# Patient Record
Sex: Female | Born: 1955 | Hispanic: No | Smoking: Never smoker
Health system: Southern US, Community
[De-identification: ages and names within clinical notes are randomized; demographics above are authoritative.]

## PROBLEM LIST (undated history)

## (undated) DIAGNOSIS — F209 Schizophrenia, unspecified: Secondary | ICD-10-CM

## (undated) DIAGNOSIS — F319 Bipolar disorder, unspecified: Secondary | ICD-10-CM

## (undated) DIAGNOSIS — I10 Essential (primary) hypertension: Secondary | ICD-10-CM

## (undated) DIAGNOSIS — R7303 Prediabetes: Secondary | ICD-10-CM

## (undated) DIAGNOSIS — R000 Tachycardia, unspecified: Secondary | ICD-10-CM

## (undated) HISTORY — PX: COLON SURGERY: SHX602

---

## 1997-12-20 ENCOUNTER — Other Ambulatory Visit: Admission: RE | Admit: 1997-12-20 | Discharge: 1997-12-20 | Payer: Self-pay | Admitting: *Deleted

## 2009-01-29 ENCOUNTER — Emergency Department (HOSPITAL_COMMUNITY): Admission: EM | Admit: 2009-01-29 | Discharge: 2009-01-29 | Payer: Self-pay | Admitting: Emergency Medicine

## 2010-08-02 LAB — POCT I-STAT, CHEM 8
BUN: 8 mg/dL (ref 6–23)
Chloride: 102 mEq/L (ref 96–112)
HCT: 46 % (ref 36.0–46.0)
Sodium: 137 mEq/L (ref 135–145)

## 2017-04-11 ENCOUNTER — Encounter (HOSPITAL_COMMUNITY): Payer: Self-pay | Admitting: Emergency Medicine

## 2017-04-11 ENCOUNTER — Emergency Department (HOSPITAL_COMMUNITY)
Admission: EM | Admit: 2017-04-11 | Discharge: 2017-04-12 | Disposition: A | Discharge status: Home / Self Care | Payer: Non-veteran care | Attending: Emergency Medicine | Admitting: Emergency Medicine

## 2017-04-11 ENCOUNTER — Other Ambulatory Visit: Payer: Self-pay

## 2017-04-11 DIAGNOSIS — F03918 Unspecified dementia, unspecified severity, with other behavioral disturbance: Secondary | ICD-10-CM

## 2017-04-11 DIAGNOSIS — F22 Delusional disorders: Secondary | ICD-10-CM | POA: Diagnosis not present

## 2017-04-11 DIAGNOSIS — F0391 Unspecified dementia with behavioral disturbance: Secondary | ICD-10-CM | POA: Insufficient documentation

## 2017-04-11 DIAGNOSIS — Z046 Encounter for general psychiatric examination, requested by authority: Secondary | ICD-10-CM | POA: Diagnosis present

## 2017-04-11 DIAGNOSIS — F319 Bipolar disorder, unspecified: Secondary | ICD-10-CM | POA: Insufficient documentation

## 2017-04-11 DIAGNOSIS — R7303 Prediabetes: Secondary | ICD-10-CM | POA: Insufficient documentation

## 2017-04-11 DIAGNOSIS — R4689 Other symptoms and signs involving appearance and behavior: Secondary | ICD-10-CM

## 2017-04-11 DIAGNOSIS — Z7984 Long term (current) use of oral hypoglycemic drugs: Secondary | ICD-10-CM | POA: Insufficient documentation

## 2017-04-11 DIAGNOSIS — F209 Schizophrenia, unspecified: Secondary | ICD-10-CM | POA: Insufficient documentation

## 2017-04-11 DIAGNOSIS — Z79899 Other long term (current) drug therapy: Secondary | ICD-10-CM | POA: Insufficient documentation

## 2017-04-11 HISTORY — DX: Schizophrenia, unspecified: F20.9

## 2017-04-11 HISTORY — DX: Prediabetes: R73.03

## 2017-04-11 HISTORY — DX: Essential (primary) hypertension: I10

## 2017-04-11 HISTORY — DX: Bipolar disorder, unspecified: F31.9

## 2017-04-11 HISTORY — DX: Tachycardia, unspecified: R00.0

## 2017-04-11 LAB — URINALYSIS, ROUTINE W REFLEX MICROSCOPIC
Bacteria, UA: NONE SEEN
Bilirubin Urine: NEGATIVE
Glucose, UA: NEGATIVE mg/dL
Hgb urine dipstick: NEGATIVE
Ketones, ur: NEGATIVE mg/dL
Leukocytes, UA: NEGATIVE
Nitrite: NEGATIVE
Protein, ur: 100 mg/dL — AB
Specific Gravity, Urine: 1.021 (ref 1.005–1.030)
pH: 5 (ref 5.0–8.0)

## 2017-04-11 LAB — CBC
HEMATOCRIT: 42.9 % (ref 36.0–46.0)
Hemoglobin: 14.8 g/dL (ref 12.0–15.0)
MCH: 30.9 pg (ref 26.0–34.0)
MCHC: 34.5 g/dL (ref 30.0–36.0)
MCV: 89.6 fL (ref 78.0–100.0)
Platelets: 295 10*3/uL (ref 150–400)
RBC: 4.79 MIL/uL (ref 3.87–5.11)
RDW: 13.2 % (ref 11.5–15.5)
WBC: 8.9 10*3/uL (ref 4.0–10.5)

## 2017-04-11 LAB — COMPREHENSIVE METABOLIC PANEL
ALBUMIN: 4.9 g/dL (ref 3.5–5.0)
ALK PHOS: 58 U/L (ref 38–126)
ALT: 53 U/L (ref 14–54)
AST: 31 U/L (ref 15–41)
Anion gap: 13 (ref 5–15)
BILIRUBIN TOTAL: 0.7 mg/dL (ref 0.3–1.2)
BUN: 16 mg/dL (ref 6–20)
CALCIUM: 9.6 mg/dL (ref 8.9–10.3)
CO2: 21 mmol/L — ABNORMAL LOW (ref 22–32)
CREATININE: 0.71 mg/dL (ref 0.44–1.00)
Chloride: 105 mmol/L (ref 101–111)
GFR calc Af Amer: >60 mL/min (ref >60)
GLUCOSE: 171 mg/dL — AB (ref 65–99)
Potassium: 3.6 mmol/L (ref 3.5–5.1)
Sodium: 139 mmol/L (ref 135–145)
TOTAL PROTEIN: 8.1 g/dL (ref 6.5–8.1)

## 2017-04-11 LAB — ACETAMINOPHEN LEVEL: Acetaminophen (Tylenol), Serum: <10 ug/mL — ABNORMAL LOW (ref 10–30)

## 2017-04-11 LAB — RAPID URINE DRUG SCREEN, HOSP PERFORMED
Amphetamines: NOT DETECTED
BARBITURATES: NOT DETECTED
BENZODIAZEPINES: NOT DETECTED
Cocaine: NOT DETECTED
Opiates: NOT DETECTED
Tetrahydrocannabinol: NOT DETECTED

## 2017-04-11 LAB — SALICYLATE LEVEL: Salicylate Lvl: <7 mg/dL (ref 2.8–30.0)

## 2017-04-11 LAB — ETHANOL: Alcohol, Ethyl (B): <10 mg/dL (ref <10)

## 2017-04-11 MED ORDER — CALCIUM CARBONATE-VITAMIN D 500-200 MG-UNIT PO TABS
1.0000 | ORAL_TABLET | Freq: Every day | ORAL | Status: DC
  Administered 2017-04-12: 1 via ORAL
  Filled 2017-04-11: qty 1

## 2017-04-11 MED ORDER — IBUPROFEN 200 MG PO TABS: 600.0000 mg | ORAL_TABLET | Freq: Three times a day (TID) | ORAL | Status: DC | PRN

## 2017-04-11 MED ORDER — RISPERIDONE 0.5 MG PO TABS
0.5000 mg | ORAL_TABLET | Freq: Two times a day (BID) | ORAL | Status: DC
  Administered 2017-04-11 – 2017-04-12 (×2): 0.5 mg via ORAL
  Filled 2017-04-11 (×2): qty 1

## 2017-04-11 MED ORDER — METFORMIN HCL 500 MG PO TABS
500.0000 mg | ORAL_TABLET | Freq: Two times a day (BID) | ORAL | Status: DC
  Administered 2017-04-11 – 2017-04-12 (×2): 500 mg via ORAL
  Filled 2017-04-11 (×2): qty 1

## 2017-04-11 MED ORDER — VITAMIN D3 25 MCG (1000 UNIT) PO TABS
2000.0000 [IU] | ORAL_TABLET | Freq: Every day | ORAL | Status: DC
  Administered 2017-04-11 – 2017-04-12 (×2): 2000 [IU] via ORAL
  Filled 2017-04-11 (×2): qty 2

## 2017-04-11 MED ORDER — RISPERIDONE 0.5 MG PO TABS: 0.5000 mg | ORAL_TABLET | Freq: Once | ORAL | Status: DC

## 2017-04-11 MED ORDER — ALUM & MAG HYDROXIDE-SIMETH 200-200-20 MG/5ML PO SUSP: 30.0000 mL | Freq: Four times a day (QID) | ORAL | Status: DC | PRN

## 2017-04-11 MED ORDER — ONDANSETRON HCL 4 MG PO TABS: 4.0000 mg | ORAL_TABLET | Freq: Three times a day (TID) | ORAL | Status: DC | PRN

## 2017-04-11 MED ORDER — HYDROXYZINE HCL 25 MG PO TABS: 25.0000 mg | ORAL_TABLET | Freq: Four times a day (QID) | ORAL | Status: DC | PRN

## 2017-04-11 MED ORDER — FERROUS SULFATE 325 (65 FE) MG PO TABS
325.0000 mg | ORAL_TABLET | Freq: Every day | ORAL | Status: DC
  Administered 2017-04-12: 325 mg via ORAL
  Filled 2017-04-11: qty 1

## 2017-04-11 MED ORDER — LOSARTAN POTASSIUM 50 MG PO TABS
100.0000 mg | ORAL_TABLET | Freq: Every day | ORAL | Status: DC
  Administered 2017-04-11 – 2017-04-12 (×2): 100 mg via ORAL
  Filled 2017-04-11 (×2): qty 2

## 2017-04-11 MED ORDER — ADULT MULTIVITAMIN W/MINERALS CH
1.0000 | ORAL_TABLET | Freq: Every day | ORAL | Status: DC
  Administered 2017-04-11 – 2017-04-12 (×2): 1 via ORAL
  Filled 2017-04-11 (×2): qty 1

## 2017-04-11 NOTE — ED Notes (Signed)
Patient placed in conference room with sitter for telepsych consult

## 2017-04-11 NOTE — ED Notes (Signed)
Pt's brother and guardian, Ronnell Guadalajaralbert Lepage, [phone]  said that pt called him and was very delusional. She demanded that he "get me the hell out of here."  She became angry with him and hung up the telephone.

## 2017-04-11 NOTE — BH Assessment (Addendum)
[facility] Progress Note  Per Juanetta BeetsJacqueline Norman, DO, this pt requires psychiatric hospitalization at this time.  Pt presents under IVC initiated by [facility], and upheld by Dr Sharma CovertNorman.  Pt requires psychiatric hospitalization at this time.  Pt's brother, Ronnell Guadalajaralbert Lepage [phone]) calls, reporting that pt appointed him as her health care power of attorney, and that since that time, he has also been awarded guardianship of patient through the court system.  After reviewing pt's EPIC record, this writer found HCPOA, but not letter of guardianship.  Mr Shaune PollackLepage agrees to fax letter of guardianship, which I subsequently received.  He informs me that pt is not insured, but she has VA benefits, and [place] [facility] [place] [place] [facility].  It is his preference that [facility] be [facility], or to any other VA hospital.  I then called the [facility] and spoke to April, who reports that they currently have beds available.  Referral has been faxed to her, and at 14:25 April confirms receipt, agreeing to staff pt with their provider.  As of this writing, final decision is pending.  Doylene Canninghomas Nakhi Choi, [place] Behavioral Health Coordinator [phone]  Addendum:  April calls back to report that pt has been declined by their provider due to their unit acuity at this time.  This Clinical research associatewriter then called the [facility] and [facility], who report that their behavioral health units are at capacity at this time.  I then called the [facility].  They report that beds are currently available, and invite me to fax referral to [phone].  Mr Shaune PollackLepage has been updated, as has Christiane HaJonathan Riffey, LCSW.  Doylene Canninghomas Aubreana Cornacchia, [place] Behavioral Health Coordinator [phone]
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
## 2017-04-11 NOTE — Progress Notes (Signed)
CSW received a call from pt's brother who was requesting any updates on the pt's progress with referring the [facility] to the [facility].  Pt also requested a ETA on pt's [place] to the [place] facility.  CSW stated there are no new updates at this time and CSW is unsure if the [facility] facilitates accepting pt's over the weekend.  Pt's brother also states he has paperwork from pt's past treatments should anyone need it.  CSW suggested pt's brother offer to share this with the [place] provider, that the ED Treatment team usually is only able to document pt's current behavior for referral reasons.  Pt's brother thanked the CSW and was appreciative of the CSW's efforts.  CSW stated he would leave a handoff for the daytime [facility] ED CSW.  Please reconsult if future social work needs arise.  CSW signing off, as social work intervention is no longer needed.  Dorothe PeaJonathan F. Jasin Brazel, LCSW, LCAS, CSI Clinical Social Worker Ph: [phone]

## 2017-04-11 NOTE — ED Notes (Signed)
Pt belongings in the cabinet [facility].  Includes: Pants, shirt, shoes, socks and a necklace with keys on it.
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
## 2017-04-12 DIAGNOSIS — F0391 Unspecified dementia with behavioral disturbance: Secondary | ICD-10-CM | POA: Diagnosis not present

## 2017-04-12 DIAGNOSIS — F03918 Unspecified dementia, unspecified severity, with other behavioral disturbance: Secondary | ICD-10-CM | POA: Diagnosis present

## 2017-04-12 MED ORDER — HYDROXYZINE HCL 25 MG PO TABS: 25.0000 mg | ORAL_TABLET | Freq: Four times a day (QID) | ORAL | 0 refills | Status: DC | PRN

## 2017-04-12 MED ORDER — RISPERIDONE 0.5 MG PO TABS: 0.5000 mg | ORAL_TABLET | Freq: Two times a day (BID) | ORAL | 0 refills | Status: DC

## 2017-04-12 NOTE — Clinical Social Work Note (Addendum)
Pt is ready for discharge. CSW spoke with [place] at [facility], who stated after visit summary is needed. CSW faxed AVS to confirmed fax number at [phone]. IVC has been rescinded by MD. CSW faxed to weekend magistrate at [phone].   CSW received call back from [place] at [facility] stating they are unable to accept pt back until Monday when they can get someone to assess pt. CSW informed GrenadaBrittany that CSW would have to make a report for abandonment of pt. GrenadaBrittany stated she would speak with her director and call CSW back. CSW updated NP, MD, and RN. CSW awaiting callback from facility.   CSW received call back from GrenadaBrittany stating pt is able to [facility] today via PTAR. CSW to update RN and arrange PTAR for pt. NP has already spoken with guardian at length to update about discharge plan. CSW signing up as no further Social Work needs identified.   Sherri [name], Sherri MajorsLCSWA, [facility] Clinical Social Worker Emergency Department [phone]
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
## 2017-04-12 NOTE — ED Notes (Signed)
[facility] refused to take patient back.  Social worker Armed forces operational officercalled director and is waiting for their decision.

## 2017-04-12 NOTE — [facility] Suicide Risk Assessment (Signed)
Suicide Risk Assessment  Discharge Assessment   [facility]   Principal Problem: Psychosis in elderly, with behavioral disturbance Discharge Diagnoses:  Patient Active Problem List   Diagnosis Date Noted  . Psychosis in elderly, with behavioral disturbance [F03.91] 04/12/2017    Priority: High    Total Time spent with patient: 45 minutes  Musculoskeletal: Strength & Muscle Tone: within normal limits Gait & Station: normal Patient leans: N/A  Psychiatric Specialty Exam: Physical Exam  Constitutional: She is oriented to person, place, and time. She appears well-developed and well-nourished.  HENT:  Head: Normocephalic.  Neck: Normal range of motion.  Respiratory: Effort normal.  Musculoskeletal: Normal range of motion.  Neurological: She is alert and oriented to person, place, and time.  Psychiatric: She has a normal mood and affect. Her speech is normal and behavior is normal. Judgment and thought content normal. Cognition and memory are normal.    Review of Systems  All other systems reviewed and are negative.   Blood pressure (!) 167/88, pulse 80, temperature 98.6 F (37 C), temperature source Oral, resp. rate 18, SpO2 99 %.There is no height or weight on file to calculate BMI.  General Appearance: Casual  Eye Contact:  Good  Speech:  Normal Rate  Volume:  Normal  Mood:  Euthymic  Affect:  Congruent  Thought Process:  Coherent and Descriptions of Associations: Intact  Orientation:  Full (Time, Place, and Person)  Thought Content:  WDL and Logical  Suicidal Thoughts:  No  Homicidal Thoughts:  No  Memory:  Immediate;   Good Recent;   Good Remote;   Good  Judgement:  Fair  Insight:  Fair  Psychomotor Activity:  Normal  Concentration:  Concentration: Good and Attention Span: Good  Recall:  Good  Fund of Knowledge:  Good  Language:  Good  Akathisia:  No  Handed:  Right  AIMS (if indicated):     Assets:  Housing Leisure Time Physical  Health Resilience Social Support  ADL's:  Intact  Cognition:  WNL  Sleep:      Mental Status Per Nursing Assessment::   On Admission:   delusions with agitation  Demographic Factors:  Female and Caucasian  Loss Factors: NA  Historical Factors: NA  Risk Reduction Factors:   Sense of responsibility to family, Living with another person, especially a relative and Positive social support  Continued Clinical Symptoms:  None   Cognitive Features That Contribute To Risk:  None    Suicide Risk:  Minimal: No identifiable suicidal ideation.  Patients presenting with no risk factors but with morbid ruminations; may be classified as minimal risk based on the severity of the depressive symptoms    Plan Of Care/Follow-up recommendations:  Activity:  as tolerated Diet:  heart healthy diet  Kang Ishida, NP 04/12/2017, 1:04 PM

## 2017-04-12 NOTE — ED Notes (Signed)
[facility] called for transport.  

## 2017-04-12 NOTE — ED Notes (Signed)
Patient left with [facility] back to [facility].

## 2017-04-12 NOTE — ED Notes (Signed)
Patient's IVC rescinded by MD.  Transportation arrangements being made for [facility].
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
## 2017-04-13 NOTE — Progress Notes (Signed)
CSW received call from patients legal guardain/ brother, Sherri [name] [phone], voicing his frustration and concerns with patient being discharged back to [facility]. Legal guardian stated he was [place] [facility] [place] [facility] [place] [place] [facility] [place]- "which did not happen". Legal guardian stated he would follow up with [facility] but felt like patient may need to return to the ED further treatment.   [name], [facility] Emergency Room Clinical Social Worker [phone]

## 2017-05-30 ENCOUNTER — Other Ambulatory Visit: Payer: Self-pay

## 2017-05-30 ENCOUNTER — Encounter (HOSPITAL_COMMUNITY): Payer: Self-pay

## 2017-05-30 ENCOUNTER — Emergency Department (HOSPITAL_COMMUNITY): Payer: Non-veteran care

## 2017-05-30 ENCOUNTER — Emergency Department (HOSPITAL_COMMUNITY)
Admission: EM | Admit: 2017-05-30 | Discharge: 2017-06-03 | Disposition: A | Discharge status: Other Acute Inpatient Hospital | Payer: Non-veteran care | Attending: Emergency Medicine | Admitting: Emergency Medicine

## 2017-05-30 DIAGNOSIS — R4587 Impulsiveness: Secondary | ICD-10-CM | POA: Diagnosis not present

## 2017-05-30 DIAGNOSIS — F209 Schizophrenia, unspecified: Secondary | ICD-10-CM | POA: Diagnosis not present

## 2017-05-30 DIAGNOSIS — R443 Hallucinations, unspecified: Secondary | ICD-10-CM | POA: Diagnosis not present

## 2017-05-30 DIAGNOSIS — I10 Essential (primary) hypertension: Secondary | ICD-10-CM | POA: Diagnosis not present

## 2017-05-30 DIAGNOSIS — F419 Anxiety disorder, unspecified: Secondary | ICD-10-CM | POA: Diagnosis not present

## 2017-05-30 DIAGNOSIS — F250 Schizoaffective disorder, bipolar type: Secondary | ICD-10-CM

## 2017-05-30 DIAGNOSIS — R450 Nervousness: Secondary | ICD-10-CM | POA: Diagnosis not present

## 2017-05-30 DIAGNOSIS — R4689 Other symptoms and signs involving appearance and behavior: Secondary | ICD-10-CM | POA: Diagnosis present

## 2017-05-30 LAB — RAPID URINE DRUG SCREEN, HOSP PERFORMED
Amphetamines: NOT DETECTED
BARBITURATES: NOT DETECTED
Benzodiazepines: NOT DETECTED
Cocaine: NOT DETECTED
Opiates: NOT DETECTED
TETRAHYDROCANNABINOL: NOT DETECTED

## 2017-05-30 LAB — COMPREHENSIVE METABOLIC PANEL
ALK PHOS: 72 U/L (ref 38–126)
ALT: 66 U/L — AB (ref 14–54)
AST: 42 U/L — ABNORMAL HIGH (ref 15–41)
Albumin: 4.9 g/dL (ref 3.5–5.0)
Anion gap: 13 (ref 5–15)
BILIRUBIN TOTAL: 0.4 mg/dL (ref 0.3–1.2)
BUN: 17 mg/dL (ref 6–20)
CALCIUM: 9.8 mg/dL (ref 8.9–10.3)
CO2: 22 mmol/L (ref 22–32)
CREATININE: 0.77 mg/dL (ref 0.44–1.00)
Chloride: 105 mmol/L (ref 101–111)
GFR calc Af Amer: >60 mL/min (ref >60)
GFR calc non Af Amer: >60 mL/min (ref >60)
GLUCOSE: 203 mg/dL — AB (ref 65–99)
Potassium: 4 mmol/L (ref 3.5–5.1)
Sodium: 140 mmol/L (ref 135–145)
TOTAL PROTEIN: 8.3 g/dL — AB (ref 6.5–8.1)

## 2017-05-30 LAB — CBC
HEMATOCRIT: 43.8 % (ref 36.0–46.0)
HEMOGLOBIN: 15 g/dL (ref 12.0–15.0)
MCH: 30.9 pg (ref 26.0–34.0)
MCHC: 34.2 g/dL (ref 30.0–36.0)
MCV: 90.3 fL (ref 78.0–100.0)
Platelets: 320 10*3/uL (ref 150–400)
RBC: 4.85 MIL/uL (ref 3.87–5.11)
RDW: 12.9 % (ref 11.5–15.5)
WBC: 13.3 10*3/uL — ABNORMAL HIGH (ref 4.0–10.5)

## 2017-05-30 LAB — ACETAMINOPHEN LEVEL: Acetaminophen (Tylenol), Serum: <10 ug/mL — ABNORMAL LOW (ref 10–30)

## 2017-05-30 LAB — ETHANOL: Alcohol, Ethyl (B): <10 mg/dL (ref <10)

## 2017-05-30 LAB — CBG MONITORING, ED: Glucose-Capillary: 182 mg/dL — ABNORMAL HIGH (ref 65–99)

## 2017-05-30 LAB — SALICYLATE LEVEL: Salicylate Lvl: <7 mg/dL (ref 2.8–30.0)

## 2017-05-30 MED ORDER — HYDROXYZINE HCL 25 MG PO TABS
25.0000 mg | ORAL_TABLET | Freq: Three times a day (TID) | ORAL | Status: DC
  Administered 2017-06-01: 25 mg via ORAL
  Filled 2017-05-30 (×4): qty 1

## 2017-05-30 MED ORDER — RISPERIDONE 0.5 MG PO TABS
0.5000 mg | ORAL_TABLET | Freq: Two times a day (BID) | ORAL | Status: DC
  Filled 2017-05-30: qty 1

## 2017-05-30 MED ORDER — METFORMIN HCL 500 MG PO TABS
500.0000 mg | ORAL_TABLET | Freq: Two times a day (BID) | ORAL | Status: DC
  Administered 2017-05-31 – 2017-06-03 (×7): 500 mg via ORAL
  Filled 2017-05-30 (×7): qty 1

## 2017-05-30 NOTE — ED Notes (Signed)
Pt's brother called stating "she has quit going to her [facility] appts.  She had a medical issue going on, couldn't do the colonoscopy.  She ended up being admitted in the fall of 2013, received 2 transfusions, had an operation because of a blockage, was on a respirator.  At one point, I thought I was going to have to pull the plug on her.  She was able to rehab, was there x 5 wks, I got her into a ALF and thought she was doing well.   She lies, she says whatever she needs to say.  I have been her POA since 2012.  She has been refusing her meds or blood sugars at the facility for a while."  The phone call lasted 20 minutes with brother Sherri [name] stating "I just sent papers to Sherri [name] so the psychiatrist can have them for review."

## 2017-05-30 NOTE — ED Notes (Signed)
DECLINED X-RAY. EDP PICKERING MADE AWARE. OKAY TO NO HAVE AT THIS TIME

## 2017-05-30 NOTE — ED Notes (Signed)
Sherri [name] [phone]-- PT RESIDES [facility] --HX OF PSYCHOTIC DISORDER - SEEN HERE 3 WEEKS AGO. EDP PICKERING ABLE TO TAKE CALL
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
## 2017-05-30 NOTE — Progress Notes (Addendum)
CSW received a call from pt's brother and legal guardian Ronnell Guadalajaralbert Lepage at ph: [phone] who wants to ensure that the psychiatrist knows that the pt has a long history of mental health diagnoses and the pt's brother would like to email the CSW the pt's paperwork and some of her paperwork from the mobile crisis unit that has treated her in the past several months, the pt's IVC paperwork and other assorted documents that give a clear picture of the pt's mental health HX.  CSW has spoken to the pt's brother before and has explained that the pt's current behaviors that are documented are what decides the pt's disposition.  Pt's brother stated he understood but would like for the psychiatrist to feel free to call the pt's brother and the pt's brother would like the pt's psychiatrist to please call the pt's brother at ph: [phone] as part of the evaluation process..  CSW will print out the pt's brother's sent documents and place them on the provider's desk in the SAPU at the request of the pt's brother who lives in [place]. Pt's brother would like to provide current observations  Please reconsult if future social work needs arise.  CSW signing off, as social work intervention is no longer needed.  CSW included [facility] Service Notes and [facility] services.  Of note: PATIENT IS A VETERAN WITH FULL V.A. BENEFITS, per the brother and that if the [facility] is [facility] then the [facility] is obligated to "[facility] the bill" at another facility  Please reconsult if future social work needs arise.  CSW signing off, as social work intervention is no longer needed.  Dorothe PeaJonathan F. Jb Dulworth, LCSW, LCAS, CSI Clinical Social Worker Ph: [phone]

## 2017-05-30 NOTE — ED Notes (Signed)
Pt again refused x-ray.  Dr. Rubin PayorPickering informed.

## 2017-05-30 NOTE — BHH Counselor (Addendum)
Pt's brother, Ronnell Guadalajaralbert Lepage [phone] (pt's guardian of person) called for an update on pt's status. Pt's brother requested to speak to pt's RN and Child psychotherapistsocial worker.    Redmond Pullingreylese D Myiah Petkus, MS, [facility], [facility] Triage Specialist [phone]
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
## 2017-05-30 NOTE — ED Notes (Signed)
ED Provider at bedside. PICKERING 
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
## 2017-05-31 ENCOUNTER — Emergency Department (HOSPITAL_COMMUNITY): Payer: Non-veteran care

## 2017-05-31 DIAGNOSIS — F250 Schizoaffective disorder, bipolar type: Secondary | ICD-10-CM

## 2017-05-31 MED ORDER — GABAPENTIN 100 MG PO CAPS
200.0000 mg | ORAL_CAPSULE | Freq: Two times a day (BID) | ORAL | Status: DC
  Filled 2017-05-31 (×4): qty 2

## 2017-05-31 MED ORDER — RISPERIDONE 0.5 MG PO TABS
0.5000 mg | ORAL_TABLET | Freq: Two times a day (BID) | ORAL | Status: DC
  Filled 2017-05-31 (×3): qty 1

## 2017-05-31 MED ORDER — CALCIUM CARBONATE-VITAMIN D 500-200 MG-UNIT PO TABS
1.0000 | ORAL_TABLET | Freq: Every day | ORAL | Status: DC
  Administered 2017-06-01 – 2017-06-03 (×3): 1 via ORAL
  Filled 2017-05-31 (×3): qty 1

## 2017-05-31 MED ORDER — LOSARTAN POTASSIUM 50 MG PO TABS
100.0000 mg | ORAL_TABLET | Freq: Every day | ORAL | Status: DC
  Administered 2017-05-31 – 2017-06-03 (×2): 100 mg via ORAL
  Filled 2017-05-31 (×5): qty 2

## 2017-05-31 NOTE — ED Notes (Signed)
Report to include Situation, Background, Assessment, and Recommendations received from Elaine RN. Patient alert and oriented, warm and dry, in no acute distress. Patient denies SI, HI, AVH and pain. Patient made aware of Q15 minute rounds and security cameras for their safety. Patient instructed to come to me with needs or concerns. 

## 2017-05-31 NOTE — ED Notes (Signed)
Hourly rounding reveals patient sleeping in room. No complaints, stable, in no acute distress. Q15 minute rounds and monitoring via Security Cameras to continue. 

## 2017-05-31 NOTE — ED Notes (Signed)
Pt refuses to give consent for us to speak to the director, Vernona RiegerLaura, at [facility] where she lives. She also will not give consent for us to speak to her brother who is her power of attorney. She does not see the need for that. She is clearly delusional and irritable.

## 2017-05-31 NOTE — ED Notes (Signed)
Report to include Situation, Background, Assessment, and Recommendations received from Diane RN. Patient alert and oriented, warm and dry, in no acute distress. Patient denies SI, HI, AVH and pain. Patient made aware of Q15 minute rounds and security cameras for their safety. Patient instructed to come to me with needs or concerns. 

## 2017-05-31 NOTE — Progress Notes (Addendum)
[facility] Sherri [name] reviewed pt info but is unable to verify her VA benefits.  They have bed availability but pt would need to be willing to self pay. CSW spoke to Sherri [name] (Sam AOD), Sherri Reuel Boom(Daniel AOD), and [facility]  VAs.  No beds available. CSW spoke to [place], [place] [facility].  They do have bed availability.  She asked that info be sent to a different fax number: [phone].  This was done. Sherri [name], MSW, LCSW Clinical Social Worker 05/31/2017 11:06 AM   Additional VA forms signed by Dr A and [facility]. Sherri NashGregory Damonte [name], MSW, LCSW Clinical Social Worker 05/31/2017 1:38 PM

## 2017-05-31 NOTE — ED Notes (Signed)
Pt again refuses chest x-ray.

## 2017-05-31 NOTE — ED Notes (Signed)
Pt's brother is legal guardian, the copy of the legal document is on the chart. His telephone number in [place] is [phone].

## 2017-05-31 NOTE — Progress Notes (Addendum)
CSW faxed pt's INC paperwork and guardians[place] to the [facility] at request of disposition. [facility].  Pt's brother has called incessantly since last night for constant updates and has been updated and still calls to ensure pt will not be allowed to stay the night due to the pt's brother's fears that pt will stabilize and then be re-assessed and D/C's, which has happened in the past.  This is the brother's biggest fear and what he is striving to keep from happening. Pt''s brother is attempting to "get the pt placed" before this happens.   Pt's brother is aware [facility] and if pt is turned dpown pt's brother wlll call [facility] in [place] and attempt to "help them verify pt's VA benefits".  Per notes [facility] stated they could not verify the pt's VA benefits.  CSW will leave handoff.  Please reconsult if future social work needs arise.  CSW signing off, as social work intervention is no longer needed.  Sherri PeaJonathan F. Lakresha Stifter, LCSW, LCAS, CSI Clinical Social Worker Ph: [phone]

## 2017-05-31 NOTE — Progress Notes (Signed)
CSW received a call from pt's brother/legal guardian requesting a update.  CSW called SAPU and SAPU NP and was unable to reach.  CSW will call again.  CSW will continue to follow for D/C needs.  Dorothe PeaJonathan F. Shyam Dawson, LCSW, LCAS, CSI Clinical Social Worker Ph: [phone]

## 2017-05-31 NOTE — Progress Notes (Signed)
CSW called SAPU and disposition and was informed pt was referred [place] to [place] [facility] [place] ([facility] [place] and [place] is reviewing pt for possible admission.  [facility] will also be [facility] if [facility] is not able to take the pt.    Per disposition, pt has also been [place] out to [facility] [place].  Pt's brother/legal guardian updated at ph: Ronnell GuadalajaraAlbert Lepage at ph: [phone].  Pt's brother/legal guardian adamant pt must be [facility] first then referred out to other facilities if [facility] facilities are full.  CSW will continue to follow for D/C needs.  Dorothe PeaJonathan F. Damonte Frieson, LCSW, LCAS, CSI Clinical Social Worker Ph: [phone]
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
## 2017-05-31 NOTE — ED Notes (Signed)
Hourly rounding reveals patient in room. Stable, in no acute distress. Q15 minute rounds and monitoring via Security Cameras to continue. 

## 2017-05-31 NOTE — ED Notes (Signed)
Pt refused meds stating "I can refuse.  My brother had those deceased."

## 2017-06-01 DIAGNOSIS — R450 Nervousness: Secondary | ICD-10-CM

## 2017-06-01 DIAGNOSIS — F419 Anxiety disorder, unspecified: Secondary | ICD-10-CM

## 2017-06-01 DIAGNOSIS — F250 Schizoaffective disorder, bipolar type: Secondary | ICD-10-CM | POA: Diagnosis not present

## 2017-06-01 NOTE — ED Notes (Signed)
Hourly rounding reveals patient sleeping in room. No complaints, stable, in no acute distress. Q15 minute rounds and monitoring via Security Cameras to continue. 

## 2017-06-01 NOTE — ED Notes (Signed)
Patient refused all her bedtime med despite encouragement and med education. Patient stated "I am a veteran. I have the right to refuse". Patient appear to be paranoid and disoriented to time and day. Patient was asking about her breakfast at 8:40 pm and seems not to understand even when this writer explained the time and day to her. Isolated and calm. Denies pain, SI/HI, AH/VH at this time. Verbalized no concern. Will continue to monitor patient.
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
## 2017-06-01 NOTE — ED Notes (Signed)
Refused vitals 

## 2017-06-01 NOTE — Progress Notes (Signed)
Per Psychiatrist Akintayo and NP [facility], patient continues to meet inpatient criteria.   [facility], AOD reported that patient's referral is currently under review.  CSW provided update to patient's brother (legal guardian). CSW agreed to contact with any updates.  CSW will continue to follow and assist with patient's placement.   Sherri [name], [place] Clinical Social Worker [name] Cell#: [phone]

## 2017-06-01 NOTE — Progress Notes (Signed)
CSW informed by patient's RN, that [facility] called and requested additional information. CSW sent requested additional documents. CSW will continue to follow and assist with patient's placement.   Celso SickleKimberly Skylor Hughson, [place] Clinical Social Worker [facility] Cell#: [phone]

## 2017-06-01 NOTE — Progress Notes (Signed)
[facility]. AOD reported that patient's referral was still under review.  CSW contacted patient's brother and provided update.  CSW awaiting return call from [facility], regarding ability to offer patient a bed.  Celso SickleKimberly Andoni Busch, [place] Clinical Social Worker [facility] Cell#: [phone]
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
## 2017-06-02 DIAGNOSIS — F250 Schizoaffective disorder, bipolar type: Secondary | ICD-10-CM | POA: Diagnosis not present

## 2017-06-02 DIAGNOSIS — R4587 Impulsiveness: Secondary | ICD-10-CM

## 2017-06-02 DIAGNOSIS — R443 Hallucinations, unspecified: Secondary | ICD-10-CM | POA: Diagnosis not present

## 2017-06-02 MED ORDER — LORAZEPAM 2 MG/ML IJ SOLN
2.0000 mg | Freq: Once | INTRAMUSCULAR | Status: AC
  Administered 2017-06-02: 2 mg via INTRAMUSCULAR
  Filled 2017-06-02: qty 1

## 2017-06-02 MED ORDER — HALOPERIDOL LACTATE 2 MG/ML PO CONC
5.0000 mg | Freq: Once | ORAL | Status: AC
  Administered 2017-06-02: 5 mg via ORAL
  Filled 2017-06-02: qty 2.5

## 2017-06-02 MED ORDER — HALOPERIDOL LACTATE 5 MG/ML IJ SOLN
1.0000 mg | Freq: Two times a day (BID) | INTRAMUSCULAR | Status: DC
  Filled 2017-06-02: qty 1

## 2017-06-02 NOTE — ED Notes (Signed)
Pt is extremely tangential.  She refuses all of her medications except calcium and Metformin.  She is threatening law suits and federal prison to all staff.  When I told her she should take her psych medication she told me "I should not speak to her anymore"   15 minute checks and video monitoring continue.
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
## 2017-06-02 NOTE — BH Assessment (Addendum)
[facility]  Per Juanetta BeetsJacqueline Norman, DO, this pt requires psychiatric hospitalization.  Malva LimesLinsey Strader, RN, [facility]; she will call when bed is ready.  Pt presents under IVC initiated by EDP Benjiman CoreNathan Pickering, MD.  First Examinations by both Dr Rubin PayorPickering and Thedore MinsMojeed Akintayo, MD, are found on pt's charge, and all IVC documents have been [facility], along with letter of guardianship, assigned to pt's brother, Ronnell Guadalajaralbert LePage [phone]  Pt's nurse, Kendal Hymendie, has been notified, and agrees to call report to [phone].  Pt is to be transported via Patent examinerlaw enforcement.   Doylene Canninghomas Parminder Cupples, [place] Behavioral Health Coordinator [phone]   Addendum:  At 16:15 this writer called Mr Shaune PollackLePage and informed him that [facility].  I provided him with the direct phone number to the Adult Unit.  Doylene Canninghomas Jaquila Santelli, [place] Behavioral Health Coordinator [phone]
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
## 2017-06-02 NOTE — ED Notes (Signed)
Pt sleeping at present, no distress noted, calm at present.  Monitoring for safety, Q 15 min checks in effect.  Pending report to [facility] and [facility] transfer. 

## 2017-06-02 NOTE — ED Provider Notes (Signed)
Patient with hx schizophrenia and bipolar - on exam, pt appears acutely psychotic, with paranoid delusions. Patient has been refusing meds - given acute psychotic state, administering medication despite patients refusal appears to be warranted, and in patients best interest.   Patient is awaiting inpatient psychiatric placement.      Sherri [name], Sherri Vassey, MD 06/02/17 1135

## 2017-06-02 NOTE — ED Notes (Signed)
[facility]  Pt is IVC, Pending transport at 1am.
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
## 2017-06-02 NOTE — ED Notes (Addendum)
AS PER AC TORI AT [facility], PT'S BED ON HOLD UNTIL AM DUE TO STAFFING ISSUES. TORI SPOKE WITH CHARGE NURSE LISA ADKINS.

## 2017-06-03 ENCOUNTER — Encounter (HOSPITAL_COMMUNITY): Payer: Self-pay

## 2017-06-03 ENCOUNTER — Other Ambulatory Visit: Payer: Self-pay

## 2017-06-03 ENCOUNTER — Inpatient Hospital Stay (HOSPITAL_COMMUNITY)
Admission: AD | Admit: 2017-06-03 | Discharge: 2017-07-16 | DRG: 885 | Disposition: A | Discharge status: Home / Self Care | Payer: Federal, State, Local not specified - Other | Attending: Psychiatry | Admitting: Psychiatry

## 2017-06-03 DIAGNOSIS — F209 Schizophrenia, unspecified: Secondary | ICD-10-CM | POA: Diagnosis not present

## 2017-06-03 DIAGNOSIS — I10 Essential (primary) hypertension: Secondary | ICD-10-CM | POA: Diagnosis not present

## 2017-06-03 DIAGNOSIS — F201 Disorganized schizophrenia: Secondary | ICD-10-CM | POA: Diagnosis not present

## 2017-06-03 DIAGNOSIS — R450 Nervousness: Secondary | ICD-10-CM | POA: Diagnosis not present

## 2017-06-03 DIAGNOSIS — Z62810 Personal history of physical and sexual abuse in childhood: Secondary | ICD-10-CM | POA: Diagnosis not present

## 2017-06-03 DIAGNOSIS — Z7984 Long term (current) use of oral hypoglycemic drugs: Secondary | ICD-10-CM

## 2017-06-03 DIAGNOSIS — Z9114 Patient's other noncompliance with medication regimen: Secondary | ICD-10-CM

## 2017-06-03 DIAGNOSIS — R451 Restlessness and agitation: Secondary | ICD-10-CM | POA: Diagnosis not present

## 2017-06-03 DIAGNOSIS — F0391 Unspecified dementia with behavioral disturbance: Secondary | ICD-10-CM | POA: Diagnosis not present

## 2017-06-03 DIAGNOSIS — G4700 Insomnia, unspecified: Secondary | ICD-10-CM | POA: Diagnosis present

## 2017-06-03 DIAGNOSIS — E119 Type 2 diabetes mellitus without complications: Secondary | ICD-10-CM | POA: Diagnosis present

## 2017-06-03 DIAGNOSIS — Z91410 Personal history of adult physical and sexual abuse: Secondary | ICD-10-CM | POA: Diagnosis not present

## 2017-06-03 DIAGNOSIS — F419 Anxiety disorder, unspecified: Secondary | ICD-10-CM | POA: Diagnosis not present

## 2017-06-03 MED ORDER — GABAPENTIN 100 MG PO CAPS
200.0000 mg | ORAL_CAPSULE | Freq: Two times a day (BID) | ORAL | Status: DC
  Administered 2017-06-21: 200 mg via ORAL
  Filled 2017-06-03 (×49): qty 2

## 2017-06-03 MED ORDER — HALOPERIDOL LACTATE 5 MG/ML IJ SOLN: 1.0000 mg | Freq: Two times a day (BID) | INTRAMUSCULAR | Status: DC

## 2017-06-03 MED ORDER — HYDROXYZINE HCL 25 MG PO TABS
25.0000 mg | ORAL_TABLET | Freq: Three times a day (TID) | ORAL | Status: DC
  Administered 2017-06-03 – 2017-07-16 (×112): 25 mg via ORAL
  Filled 2017-06-03 (×17): qty 1
  Filled 2017-06-03: qty 21
  Filled 2017-06-03 (×48): qty 1
  Filled 2017-06-03: qty 21
  Filled 2017-06-03 (×31): qty 1
  Filled 2017-06-03: qty 21
  Filled 2017-06-03 (×48): qty 1
  Filled 2017-06-03: qty 8

## 2017-06-03 MED ORDER — MAGNESIUM HYDROXIDE 400 MG/5ML PO SUSP: 30.0000 mL | Freq: Every day | ORAL | Status: DC | PRN

## 2017-06-03 MED ORDER — LOSARTAN POTASSIUM 50 MG PO TABS
100.0000 mg | ORAL_TABLET | Freq: Every day | ORAL | Status: DC
  Administered 2017-06-04 – 2017-07-16 (×40): 100 mg via ORAL
  Filled 2017-06-03 (×26): qty 2
  Filled 2017-06-03: qty 14
  Filled 2017-06-03 (×18): qty 2

## 2017-06-03 MED ORDER — METFORMIN HCL 500 MG PO TABS
500.0000 mg | ORAL_TABLET | Freq: Two times a day (BID) | ORAL | Status: DC
  Administered 2017-06-03 – 2017-07-16 (×73): 500 mg via ORAL
  Filled 2017-06-03 (×36): qty 1
  Filled 2017-06-03: qty 14
  Filled 2017-06-03 (×7): qty 1
  Filled 2017-06-03: qty 14
  Filled 2017-06-03 (×52): qty 1

## 2017-06-03 MED ORDER — ALUM & MAG HYDROXIDE-SIMETH 200-200-20 MG/5ML PO SUSP: 30.0000 mL | ORAL | Status: DC | PRN

## 2017-06-03 MED ORDER — RISPERIDONE 0.5 MG PO TABS
0.5000 mg | ORAL_TABLET | Freq: Two times a day (BID) | ORAL | Status: DC
  Administered 2017-06-03: 0.5 mg via ORAL

## 2017-06-03 MED ORDER — CALCIUM CARBONATE-VITAMIN D 500-200 MG-UNIT PO TABS
1.0000 | ORAL_TABLET | Freq: Every day | ORAL | Status: DC
  Administered 2017-06-04 – 2017-07-16 (×41): 1 via ORAL
  Filled 2017-06-03 (×6): qty 1
  Filled 2017-06-03: qty 7
  Filled 2017-06-03 (×38): qty 1

## 2017-06-03 MED ORDER — ACETAMINOPHEN 325 MG PO TABS: 650.0000 mg | ORAL_TABLET | Freq: Four times a day (QID) | ORAL | Status: DC | PRN

## 2017-06-03 NOTE — Tx Team (Signed)
Initial Treatment Plan 06/03/2017 2:49 PM Sherri LuisMichelle S Wey ZOX:[id number]    PATIENT STRESSORS: Medication change or noncompliance Traumatic event   PATIENT STRENGTHS: Communication skills Supportive family/friends   PATIENT IDENTIFIED PROBLEMS: Psychosis/paranoid/delusional  "I don't have a goal, I was forced to come here"                   DISCHARGE CRITERIA:  Improved stabilization in mood, thinking, and/or behavior Verbal commitment to aftercare and medication compliance  PRELIMINARY DISCHARGE PLAN: Outpatient therapy Medication management  PATIENT/FAMILY INVOLVEMENT: This treatment plan has been presented to and reviewed with the patient, Sherri LuisMichelle S Bradburn.  The patient and family have been given the opportunity to ask questions and make suggestions.  Levin BaconHeather V Legion Discher, RN 06/03/2017, 2:49 PM
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
## 2017-06-03 NOTE — Progress Notes (Signed)
This Clinical research associatewriter received call from patient's brother and guardian Clenton Parelbert Lepaige. Guardianship papers are in chart and pt. has given written consent as well. Mr. Monico HoarLepaige was given code number and requests to be part of the patient's evaluation and treatment plan.Marland Kitchen. He is requesting that he be contacted at [phone] [facility] This Clinical research associatewriter will contact assigned clinical [facility] [employer] [place] and inform him of pt.'s brother request.  He also wants to discuss with Rod that he can send pt.'[facility].  He is also requesting that HgbA1c be drawn and she is refusing to have her blood sugar drawn today. He wants us to know that he is concerned about her well-being and that she has no history of drinking alcohol or substance abuse. Denver FasterVictoria Rox Mcgriff RN-BC A/C

## 2017-06-03 NOTE — Progress Notes (Addendum)
Writer received call from [place]ary [facility] who inquried if patient still needed placement. Informed that pt was placed. Lary advised that: "It's actually [facility] with patient's location. Ref[facility] to [facility] in the future."  Melbourne Abtsatia Keiland Pickering, MSW, [employer]CSWA Clinical social worker in disposition 06/03/2017 6:05 PM
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
## 2017-06-03 NOTE — Progress Notes (Signed)
Pt took a shower and in lying in her bed. Writer rechecked pulse manually at 70. Safety maintained on the unit.
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
## 2017-06-04 DIAGNOSIS — Z62810 Personal history of physical and sexual abuse in childhood: Secondary | ICD-10-CM

## 2017-06-04 DIAGNOSIS — F201 Disorganized schizophrenia: Principal | ICD-10-CM

## 2017-06-04 MED ORDER — LORAZEPAM 1 MG PO TABS: 1.0000 mg | ORAL_TABLET | ORAL | Status: DC | PRN

## 2017-06-04 MED ORDER — TRAZODONE HCL 50 MG PO TABS
50.0000 mg | ORAL_TABLET | Freq: Every evening | ORAL | Status: DC | PRN
  Filled 2017-06-04: qty 1
  Filled 2017-06-04: qty 10
  Filled 2017-06-04: qty 7

## 2017-06-04 MED ORDER — OLANZAPINE 10 MG PO TBDP: 10.0000 mg | ORAL_TABLET | Freq: Three times a day (TID) | ORAL | Status: DC | PRN

## 2017-06-04 MED ORDER — ZIPRASIDONE MESYLATE 20 MG IM SOLR: 20.0000 mg | INTRAMUSCULAR | Status: DC | PRN
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
## 2017-06-04 NOTE — Progress Notes (Signed)
Recreation Therapy Notes  Date: 06/04/17 Time: 1000 Location: 500 [address] Dayroom  Group Topic: Self-Esteem  Goal Area(s) Addresses:  Patient will successfully identify positive attributes about themselves.  Patient will successfully identify benefit of improved self-esteem.   Intervention: Worksheet, pencils   Activity: My Strengths and Qualities.  Patients were given a worksheet in which they were to come up with three answers for the following categories:  Things I'm good at, compliments I've received, what I like about my appearance, challenges I've overcome, I've helped others by, things that make me unique, what I value the most and times I've made others happy.  Education:  Self-Esteem, Building control surveyorDischarge Planning.   Education Outcome: Acknowledges education/In group clarification offered/Needs additional education  Clinical Observations/Feedback: Pt did not attend group.   Caroll RancherMarjette Camia Dipinto, [name]     Lillia AbedLindsay, Eavan Gonterman A 06/04/2017 11:59 AM

## 2017-06-04 NOTE — Progress Notes (Signed)
Recreation Therapy Notes  INPATIENT RECREATION THERAPY ASSESSMENT  Patient Details Name: Sherri [name] MRN: [id number] DOB: 1955-11-30 Today's Date: 06/04/2017       Information Obtained From: Patient  Able to Participate in Assessment/Interview: Yes  Patient Presentation: Alert, Groomed  Reason for Admission (Per Patient): Other (Comments)(Evaluation, medication)  Patient Stressors: ("No, no real stressors")  Coping Skills:   Music, Exercise, Write, Talk, Prayer, Read, Other (Comment)(Help others)  Leisure Interests (2+):  Sports - Swimming, Crafts - Sewing, Garment/textile technologistCommunity - Travel (Comment), Software engineerCommunity - Volunteer (Comment)  Frequency of Recreation/Participation: Other (Comment)("Not so much")  Awareness of Community Resources:  Yes  Community Resources:  [employer], Research scientist (physical sciences)Movie Theaters, Other (Comment)(Thrift store)  Current Use: Yes(Uses the Engineer, materialsthrift store)  Expressed Interest in State Street CorporationCommunity Resource Information: No  Patient Main Form of Transportation: Set designerCar  Patient Strengths:  Water quality scientistlexable; Dependable  Patient Identified Areas of Improvement:  Watching weight; Medication  Current Recreation Participation:  Not much  Patient Goal for Hospitalization:  "Evaluation and medication"  [place]  [place] Residence:  [place]  Current SI (including self-harm):  No  Current HI:  No  Current AVH: No  Staff Intervention Plan: Group Attendance  Consent to Intern Participation: N/A   Caroll RancherMarjette Dacoda Spallone, [name]  Lillia AbedLindsay, Keion Neels A 06/04/2017, 2:08 PM

## 2017-06-04 NOTE — Tx Team (Signed)
Interdisciplinary Treatment and Diagnostic Plan Update  06/04/2017 Time of Session: 11:25 AM  [name] MRN: [id number]  Principal Diagnosis: Schizophrenia, disorganized, chronic (HCC)  Secondary Diagnoses: Active Problems:   Schizophrenia, disorganized, chronic (HCC)   Current Medications:  Current Facility-Administered Medications  Medication Dose Route Frequency Provider Last Rate Last Dose  . acetaminophen (TYLENOL) tablet 650 mg  650 mg Oral Q6H PRN Sherri Abbe, NP      . alum & mag hydroxide-simeth (MAALOX/MYLANTA) 200-200-20 MG/5ML suspension 30 mL  30 mL Oral Q4H PRN Sherri Abbe, NP      . calcium-vitamin D (OSCAL WITH D) 500-200 MG-UNIT per tablet 1 tablet  1 tablet Oral Q breakfast Sherri Abbe, NP   1 tablet at 06/04/17 [phone]  . gabapentin (NEURONTIN) capsule 200 mg  200 mg Oral BID Sherri Abbe, NP      . hydrOXYzine (ATARAX/VISTARIL) tablet 25 mg  25 mg Oral Q8H Sherri Abbe, NP   25 mg at 06/03/17 1555  . losartan (COZAAR) tablet 100 mg  100 mg Oral Daily Sherri Abbe, NP   100 mg at 06/04/17 1191  . magnesium hydroxide (MILK OF MAGNESIA) suspension 30 mL  30 mL Oral Daily PRN Sherri Abbe, NP      . metFORMIN (GLUCOPHAGE) tablet 500 mg  500 mg Oral BID WC Sherri Abbe, NP   500 mg at 06/04/17 4782    PTA Medications: Medications Prior to Admission  Medication Sig Dispense Refill Last Dose  . acetaminophen (TYLENOL) 500 MG tablet Take 1,000 mg by mouth every 8 (eight) hours as needed for mild pain.   unk  . calcium-vitamin D (OSCAL WITH D) 500-200 MG-UNIT tablet Take 1 tablet by mouth daily with breakfast.   Past Week at Unknown time  . chlorhexidine (PERIDEX) 0.12 % solution Use as directed 15 mLs in the mouth or throat 2 (two) times daily.   Past Week at Unknown time  . cholecalciferol (VITAMIN D) 1000 units tablet Take 2,000 Units by mouth daily.   Past Week at Unknown time  . Cinnamon 500  MG capsule Take 500 mg by mouth daily.   Past Week at Unknown time  . Cranberry (CRANBERRY CONCENTRATE) 500 MG CAPS Take 1 capsule by mouth daily.   Past Week at Unknown time  . ferrous sulfate 325 (65 FE) MG tablet Take 325 mg by mouth daily with breakfast.   Past Week at Unknown time  . guaifenesin (HUMIBID E) 400 MG TABS tablet Take 400 mg by mouth every 8 (eight) hours as needed (mucus relief).   unk  . guaiFENesin-dextromethorphan (ROBITUSSIN DM) 100-10 MG/5ML syrup Take 10 mLs by mouth every 4 (four) hours as needed for cough.   unk  . hydrOXYzine (ATARAX/VISTARIL) 25 MG tablet Take 1 tablet (25 mg total) by mouth every 6 (six) hours as needed for anxiety. (Patient taking differently: Take 25 mg by mouth every 8 (eight) hours. ) 30 tablet 0 Past Week at Unknown time  . loratadine (CLARITIN) 10 MG tablet Take 10 mg by mouth daily as needed for allergies.   unk at Unknown time  . losartan (COZAAR) 100 MG tablet Take 100 mg by mouth daily.   Past Week at Unknown time  . metFORMIN (GLUCOPHAGE) 500 MG tablet Take 500 mg by mouth 2 (two) times daily with a meal.   Past Week at Unknown time  . Multiple Vitamin (MULTIVITAMIN WITH MINERALS) TABS tablet Take 1 tablet by  mouth daily.   Past Week at Unknown time  . risperiDONE (RISPERDAL) 0.5 MG tablet Take 1 tablet (0.5 mg total) by mouth 2 (two) times daily. 60 tablet 0 Past Week at Unknown time    Treatment Modalities: Medication Management, Group therapy, Case management,  1 to 1 session with clinician, Psychoeducation, Recreational therapy.  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  Therapeutic Interventions: 1 to 1 sessions, Unit Group sessions and Medication administration.  Evaluation of Outcomes: Progressing  Physician Treatment Plan for Secondary Diagnosis: Active Problems:   Schizophrenia, disorganized, chronic (HCC)  Long Term Goal(s): Improvement in symptoms so as ready for discharge  Short Term Goals:    Medication Management: Evaluate patient's response, side effects, and tolerance of medication regimen.  Therapeutic Interventions: 1 to 1 sessions, Unit Group sessions and Medication administration.  Evaluation of Outcomes: Progressing   RN Treatment Plan for Primary Diagnosis: Schizophrenia, disorganized, chronic (HCC) Long Term Goal(s): Knowledge of disease and therapeutic regimen to maintain health will improve  Short Term Goals: Ability to participate in decision making will improve, Ability to identify and develop effective coping behaviors will improve and Compliance with prescribed medications will improve  Medication Management: RN will administer medications as ordered by provider, will assess and evaluate patient's response and provide education to patient for prescribed medication. RN will report any adverse and/or side effects to prescribing provider.  Therapeutic Interventions: 1 on 1 counseling sessions, Psychoeducation, Medication administration, Evaluate responses to treatment, Monitor vital signs and CBGs as ordered, Perform/monitor CIWA, COWS, AIMS and Fall Risk screenings as ordered, Perform wound care treatments as ordered.  Evaluation of Outcomes: Progressing   LCSW Treatment Plan for Primary Diagnosis: Schizophrenia, disorganized, chronic (HCC) Long Term Goal(s): Safe transition to appropriate next level of care at discharge, Engage patient in therapeutic group addressing interpersonal concerns.  Short Term Goals: Engage patient in aftercare planning with referrals and resources, Facilitate acceptance of  mental health diagnosis and concerns, Identify triggers associated with mental health/substance abuse issues and Increase skills for wellness and recovery  Therapeutic Interventions: Assess for all discharge needs, 1 to 1 time with Social worker, Explore available resources and support systems, Assess for adequacy in community support network, Educate family and significant other(s) on suicide prevention, Complete Psychosocial Assessment, Interpersonal group therapy.  Evaluation of Outcomes: Progressing   Progress in Treatment: Attending groups: Yes Participating in groups: Yes Taking medication as prescribed: Yes Toleration of medication: Yes, no side effects reported at this time Family/Significant other contact made: Kathi Ludwig [phone] (Brother/Legal Guardian) Patient understands diagnosis: No, limited insight Discussing patient identified problems/goals with staff: Yes Medical problems stabilized or resolved: Yes Denies suicidal/homicidal ideation: Yes Issues/concerns per patient self-inventory: None Other: N/A  New problem(s) identified: None identified at this time.   New Short Term/Long Term Goal(s): "I'm not supposed to be here.  I am supposed to be evaluated for a medication adjustment at the [place]".   Discharge Plan or Barriers: Upon discharge pt will return to [facility] and will follow up at [facility] and with the [place].   Reason for Continuation of Hospitalization: Delusions  Hallucinations Medication stabilization   Estimated Length of  Stay: 06/09/17  Attendees: Patient: Sherri BlalockMichelle [name]  06/04/2017  11:25 AM  Physician: Jolyne Loahristopher Rainville, MD 06/04/2017  11:25 AM  Nursing: Estella HuskElizabeth Awofabeju, RN 06/04/2017  11:25 AM  RN Care Manager: Onnie BoerJennifer Clark, RN 06/04/2017  11:25 AM  Social Worker: Richelle Itood North, LCSW; Melba CoonAngel Alexandru Moorer, Social Work Intern 06/04/2017  11:25 AM  Recreational Therapist: Caroll RancherMarjette [name], LRT 06/04/2017  11:25 AM   Other: Tomasita Morrowelora Sutton, P4CC 06/04/2017  11:25 AM  Other:  06/04/2017  11:25 AM  Other: 06/04/2017  11:25 AM    Scribe for Treatment Team: Aram BeechamAngel M Lurae Hornbrook, Student-Social Work 06/04/2017 11:25 AM

## 2017-06-04 NOTE — BHH Group Notes (Signed)
LCSW Group Therapy Note   06/04/2017 1:15pm   Type of Therapy and Topic:  Group Therapy:  Trust and Honesty  Participation Level:  Did Not Attend  Description of Group:    In this group patients will be asked to explore the value of being honest.  Patients will be guided to discuss their thoughts, feelings, and behaviors related to honesty and trusting in others. Patients will process together how trust and honesty relate to forming relationships with peers, family members, and self. Each patient will be challenged to identify and express feelings of being vulnerable. Patients will discuss reasons why people are dishonest and identify alternative outcomes if one was truthful (to self or others). This group will be process-oriented, with patients participating in exploration of their own experiences, giving and receiving support, and processing challenge from other group members.   Therapeutic Goals: 1. Patient will identify why honesty is important to relationships and how honesty overall affects relationships.  2. Patient will identify a situation where they lied or were lied too and the  feelings, thought process, and behaviors surrounding the situation 3. Patient will identify the meaning of being vulnerable, how that feels, and how that correlates to being honest with self and others. 4. Patient will identify situations where they could have told the truth, but instead lied and explain reasons of dishonesty.   Summary of Patient Progress    Therapeutic Modalities:   Cognitive Behavioral Therapy Solution Focused Therapy Motivational Interviewing Brief Therapy  Ida RogueRodney B Jaydin Boniface, LCSW 06/04/2017 1:23 PM

## 2017-06-04 NOTE — Progress Notes (Signed)
Nursing Progress Note 1900-0730  D) Patient presents with anxious mood but is pleasant and cooperative with writer this evening. Patient reports, "today was a good day, I am working stuff out". Patient did attend group and is seen interactive in the milieu. Patient denies SI/HI/AVH or pain but is observed responding to internal stimuli. Patient contracts for safety on the unit. Patient compliant with scheduled Vistaril but refuses PRN Trazodone and states, "I am sleeping better than I normally do".  A) Patient educated about and provided medication as scheduled or requested per provider's orders. Patient safety maintained with q15 min safety checks. Moderate fall risk precautions in place. Emotional support given. 1:1 interaction and active listening provided. Snacks and fluids provided. Labs, vital signs and patient behavior monitored throughout shift. Patient encouraged to work on treatment plan.  R) Patient remains safe on the unit at this time. Patient agrees to make needs known to staff. Will continue to monitor and assess for changes.

## 2017-06-04 NOTE — [facility] Suicide Risk Assessment (Signed)
[facility]   Nursing information obtained from:  Patient Demographic factors:  Caucasian Current Mental Status:  NA Loss Factors:  NA Historical Factors:  NA Risk Reduction Factors:  NA  Total Time spent with patient: 1 hour Principal Problem: Schizophrenia, disorganized, chronic (HCC) Diagnosis:   Patient Active Problem List   Diagnosis Date Noted  . 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 Schizoaffective disorder, bipolar type (HCC) [F25.0] 05/31/2017  . 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 Pt has guardian which is her brother whom lives in the state [place]. Pt has remained fixated with concern that she was [place] [facility] and there is an ongoing investigation into the matter. Collateral information obtained from pt's brother, Sherri [name], suggests that patient has been decompensating in recent months with worsening paranoia and agitation at her assisted living facility. She had her license removed after she showed up to her dentist's office and refused to leave stating that she had an appointment when she did not. Pt has been non adherent to her medications. As per report from pt's facility, she has been verbally aggressive and threatening to others. Pt was minimally cooperative with interview, and she refused to be started on any psychotropic medications at time of evaluation.   Continued Clinical Symptoms:  Alcohol Use Disorder Identification Test Final Score (AUDIT): 0 The "Alcohol Use Disorders Identification Test", Guidelines for Use in Primary Care, Second Edition.  World Science writerHealth Organization  [facility](WHO). Score between 0-7:  no or low risk or alcohol related problems. Score between 8-15:  moderate risk of alcohol related problems. Score between 16-19:  high risk of alcohol related problems. Score 20 or above:  warrants further diagnostic evaluation for alcohol dependence and treatment.   CLINICAL FACTORS:   Severe Anxiety and/or Agitation Schizophrenia:   Paranoid or undifferentiated type More than one psychiatric diagnosis Previous Psychiatric Diagnoses and Treatments Medical Diagnoses and Treatments/Surgeries   Musculoskeletal: Strength & Muscle Tone: within normal limits Gait & Station: normal Patient leans: N/A  Psychiatric Specialty Exam: Physical Exam  Nursing note and vitals reviewed.   ROS - see H&P  Blood pressure (!) 132/99, pulse 85, temperature 97.8 F (36.6 C), temperature source Oral, resp. rate 18, height 4' 11.45" (1.51 m), weight 62.1 kg (137 lb), SpO2 97 %.Body mass index is 27.25 kg/m.  General Appearance: Casual and Fairly Groomed  Eye Contact:  Good  Speech:  Clear and Coherent and Normal Rate  Volume:  Normal  Mood:  Irritable  Affect:  Appropriate, Congruent and Constricted  Thought Process:  Coherent, Goal Directed and Descriptions of Associations: Loose  Orientation:  Full (Time, Place, and Person)  Thought Content:  Ideas of Reference:   Paranoia Delusions, Paranoid Ideation and Rumination  Suicidal Thoughts:  No  Homicidal Thoughts:  No  Memory:  Immediate;   Fair Recent;   Fair Remote;   Fair  Judgement:  Poor  Insight:  Lacking  Psychomotor Activity:  Normal  Concentration:  Concentration: Good  Recall:  Good  Fund of Knowledge:  Good  Language:  Good  Akathisia:  No  Handed:    AIMS (if indicated):     Assets:  Communication Skills Leisure  Time Physical Health Resilience  ADL's:  Intact  Cognition:  WNL  Sleep:  Number of Hours: 6        COGNITIVE FEATURES THAT CONTRIBUTE TO RISK:  Closed-mindedness, Polarized  thinking and Thought constriction (tunnel vision)    SUICIDE RISK:   Minimal: No identifiable suicidal ideation.  Patients presenting with no risk factors but with morbid ruminations; may be classified as minimal risk based on the severity of the depressive symptoms  PLAN OF CARE:   - admit to inpatient level of care  -Schizophrenia   - Pt refusing all psychotropic medications at time of evaluation  -Anxiety/Agitation   - Start gabapentin 200mg  po BID   - Start atarax 25mg  po q8h prn anxiety   -Start agitation protocol with zydis/ativan/geodon  - HTN   -Restart losartan 100mg  po qDay  - DMII   - Restart metformin 500mg  po BID  - Insomnia    - Start trazodone 50mg  po qhs prn insomnia  -Encourage participation in groups and the therapeutic milieu  -Discharge planning will be ongoing   I certify that inpatient services furnished can reasonably be expected to improve the patient's condition.   Micheal Likens, MD 06/04/2017, 5:50 PM
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
## 2017-06-04 NOTE — BHH Counselor (Signed)
[facility] as is [facility].  No beds. Information on patient that was sent over the weekend has not even been reviewed as a result.

## 2017-06-04 NOTE — BHH Group Notes (Signed)
B[facility] Group Notes:  (Nursing/MHT/Case Management/Adjunct)  Date:  06/04/2017  Time:  1600  Type of Therapy:  Nurse Education - Identifying Needs to Promote Wellness  Participation Level:  Did Not Attend  Participation Quality:    Affect:    Cognitive:    Insight:    Engagement in Group:    Modes of Intervention:    Summary of Progress/Problems: Patient invited to attend group however stated, "I'm just going to stay here in my bed. I'm tired from going outside."  Lawrence MarseillesFriedman, Kahlen Morais Eakes 06/04/2017, 4:38 PM

## 2017-06-04 NOTE — Progress Notes (Signed)
Adult Psychoeducational Group Note  Date:  06/04/2017 Time:  11:18 PM  Group Topic/Focus:  Wrap-Up Group:   The focus of this group is to help patients review their daily goal of treatment and discuss progress on daily workbooks.  Participation Level:  Active  Participation Quality:  Appropriate, Sharing and Supportive  Affect:  Appropriate and Excited  Cognitive:  Alert, Appropriate, Oriented and Hallucinating  Insight: Appropriate  Engagement in Group:  Engaged  Modes of Intervention:  Discussion  Additional Comments:  Pt stated her goal for the day was to get fresh air, exercise, and eat. Pt stated she accomplished her goal and enjoyed the courtyard and food. Pt rated her day 9/10 and would like to go outside again tomorrow.  Sherri [name] A Notnamed Croucher 06/04/2017, 11:18 PM

## 2017-06-05 MED ORDER — PALIPERIDONE ER 6 MG PO TB24
6.0000 mg | ORAL_TABLET | Freq: Every day | ORAL | Status: DC
  Administered 2017-06-09 – 2017-06-10 (×2): 6 mg via ORAL
  Filled 2017-06-05 (×9): qty 1

## 2017-06-05 NOTE — Progress Notes (Signed)
D:Pt is very suspicious and guarded especially around men. Pt's mood is labile and she is reluctant to talk with the MD about her medications even with a female nurse in the room. Pt refuses some of her treatment including not letting staff obtain CBGs. She did ask about going outside and is currently off the unit for an outside activity.  A:Offered support, encouragement. 15 minute checks and redirection. R:Pt denies si and hi. Safety maintained on the unit.

## 2017-06-05 NOTE — Progress Notes (Signed)
Recreation Therapy Notes  Date: 06/05/17 Time: 1000 Location: 500 Hall Dayroom  Group Topic: Wellness  Goal Area(s) Addresses:  Patient will define components of whole wellness. Patient will verbalize benefit of whole wellness.  Intervention:  None  Activity: Chair Exercises.  LRT will lead patients through a series of chair exercises and stretches.  Education: Wellness, Building control surveyorDischarge Planning.   Education Outcome: Acknowledges education/In group clarification offered/Needs additional education.   Clinical Observations/Feedback: Pt did not attend group.    Caroll RancherMarjette Mukhtar Shams, [name]          Caroll RancherLindsay, Arnice Vanepps A 06/05/2017 12:35 PM
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
## 2017-06-05 NOTE — BHH Group Notes (Signed)
Adult Psychoeducational Group Note  Date:  06/05/2017 Time:  8:34 PM  Group Topic/Focus:  Wrap-Up Group:   The focus of this group is to help patients review their daily goal of treatment and discuss progress on daily workbooks.  Participation Level:  Active  Participation Quality:  Appropriate and Attentive  Affect:  Appropriate and Excited  Cognitive:  Alert and Appropriate  Insight: Appropriate and Good  Engagement in Group:  Engaged  Modes of Intervention:  Discussion and Education  Additional Comments:  Pt attended and participated in wrap up group this evening. Pt spoke about having a good day because they went outside and that they saw the fountain outside in the courtyard. Pt spoke about going to aroma therapy and that they really enjoyed it.   Chrisandra NettersOctavia A Leni Pankonin 06/05/2017, 8:34 PM

## 2017-06-05 NOTE — Plan of Care (Signed)
Pt continues to be suspicious and guarded on the unit refusing some of her medications and treatment.

## 2017-06-05 NOTE — BHH Group Notes (Signed)
BHH LCSW Group Therapy 06/05/2017 1:15pm  Type of Therapy: Group Therapy- Feelings Around Discharge & Establishing a Supportive Framework  Participation Level:  Did Not Attend  Description of Group:   What is a supportive framework? What does it look like feel like and how do I discern it from and unhealthy non-supportive network? Learn how to cope when supports are not helpful and don't support you. Discuss what to do when your family/friends are not supportive.  Summary of Patient Progress   Therapeutic Modalities:   Cognitive Behavioral Therapy Person-Centered Therapy Motivational Interviewing   Aram Beechamngel M Navy Belay, Student-Social Work 06/05/2017 10:48 AM

## 2017-06-05 NOTE — BHH Group Notes (Signed)
BHH Group Notes:  (Nursing/MHT/Case Management/Adjunct)  Date:  06/05/2017  Time:  4:54 PM  Type of Therapy:  Psychoeducational Skills  Participation Level:  Active  Participation Quality:  Appropriate and Attentive  Affect:  Appropriate  Cognitive:  Alert and Appropriate  Insight:  Appropriate and Good  Engagement in Group:  Engaged and Improving  Modes of Intervention:  Discussion and Education  Summary of Progress/Problems: Topic was on managing crisis.  Patient was attentive and participated.  Sherri [name] 06/05/2017, 4:54 PM

## 2017-06-05 NOTE — Progress Notes (Signed)
Patient refuses morning CBG reading. Patient educated about benefits and encouraged by Clinical research associatewriter but continues to refuse stating, "it's my right". Patient did allow staff to take vitals and was compliant with scheduled Vistaril. Will continue to support and monitor.
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
## 2017-06-06 NOTE — Progress Notes (Signed)
Recreation Therapy Notes  Date: 06/06/17 Time: 1000 Location: 500 Hall  Group Topic: Team Building   Goal Area(s) Addresses:  Patient will effectively work together in group.  Patient will verbalize benefit of healthy team building. Patient will verbalize positive effect of healthy team building post d/c.  Patient will identify team building techniques that made activity effective for group.   Behavioral Response: Engaged  Intervention: Veterinary surgeonubber Discs  Activity: [place]ark in [employer].  Each patient was given a rubber disc.  The group as a whole was given one extra disc.  Patients were to work together to get from one end of the hall to the other and then back to their starting point.  Education:  Team Building, Discharge Planning  Education Outcome: Acknowledges understanding/In group clarification offered/Needs additional education.   Clinical Observations/Feedback:  Pt was engaged and worked well with peers.  Pt needed some redirection because she and another peer were carrying on a conversation and not focusing on activity.  Pt was pleasant.     Caroll RancherMarjette Ching Rabideau, [name]    Caroll RancherLindsay, Ell Tiso A 06/06/2017 12:19 PM

## 2017-06-06 NOTE — Progress Notes (Signed)
D:Pt is guarded and labile when interacting. Pt continues to take some medications and refuse others.  She rates depression and anxiety as a 0 and is focused on being discharged.  A:Offered support, encouragement and 15 minute checks. R:Pt denies si and hi. Safety maintained on the unit.

## 2017-06-06 NOTE — Progress Notes (Signed)
Patient ID: [name], female   DOB: 02-26-1956, 62 y.o.   MRN: [id number]  Pt refuses her CBG this morning. States "I am prediabetic, I do not need to have it taken. Stop asking me." Pt goes to dayroom to get vitals taken. Female patients vitals being taken to which patient states "are you going to clean that afterwards, I won't have anything taken after a man because they are dirty." Pt cooperative during vital assessment. Will pass onto oncoming RN.
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
## 2017-06-06 NOTE — Progress Notes (Signed)
Pt's brother, Ronnell Guadalajaralbert Lepage [phone] (pt's guardian of person) called for an update on pt's status. Pt's brother requested to speak to pt's RN.  Guardian sts he may call back but needs a "mental health break" d/t stress.

## 2017-06-06 NOTE — Progress Notes (Signed)
Did not attend goup
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
## 2017-06-06 NOTE — [facility] Group Notes (Signed)
[facility] LCSW Group Therapy  06/06/2017  1:05 PM  Type of Therapy:  Group therapy  Participation Level:  Active  Participation Quality:  Attentive  Affect:  Flat  Cognitive:  Oriented  Insight:  Limited  Engagement in Therapy:  Limited  Modes of Intervention:  Discussion, Socialization  Summary of Progress/Problems:  Chaplain was here to lead a group on themes of hope and courage. Invited.  Chose to not attend.  Daryel Geraldorth, Yossef Gilkison B 06/06/2017 1:08 PM

## 2017-06-07 DIAGNOSIS — Z91410 Personal history of adult physical and sexual abuse: Secondary | ICD-10-CM

## 2017-06-07 DIAGNOSIS — G4700 Insomnia, unspecified: Secondary | ICD-10-CM

## 2017-06-07 DIAGNOSIS — E119 Type 2 diabetes mellitus without complications: Secondary | ICD-10-CM

## 2017-06-07 DIAGNOSIS — I10 Essential (primary) hypertension: Secondary | ICD-10-CM

## 2017-06-07 DIAGNOSIS — R451 Restlessness and agitation: Secondary | ICD-10-CM

## 2017-06-07 DIAGNOSIS — F419 Anxiety disorder, unspecified: Secondary | ICD-10-CM

## 2017-06-07 NOTE — Progress Notes (Signed)
Pt refuses CBG. 
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
## 2017-06-07 NOTE — Plan of Care (Signed)
  Progressing Safety: Periods of time without injury will increase 06/07/2017 2201 - Progressing by Sherri [name], Sharilynn Cassity J, RN Note Pt has not harmed self or others tonight.  She denies SI/HI and verbally contracts for safety.

## 2017-06-07 NOTE — Progress Notes (Signed)
Patient has been isolative to room this shift except for meal and rec time.  Patient has refused psychiatric medications but has taken somatic medications.  Patient currently denies SI, HI and AVH but has been noted to be mutter to herself. Patient has had no incidents of behavioral dyscontrol.   Assess patient for safety, offer medications as prescribed, engage patient in 1:1 staff talks.   Continue to monitor patient as planned. Patient able to contract for safety.

## 2017-06-07 NOTE — Progress Notes (Signed)
D: Pt was in bed in her room upon initial approach.  Pt presents with suspicious, apprehensive affect and mood.  She reports she is feeling "okay, just a little bit sleepy."  Her goal today was to "get fresh air and exercise."  She reports this did not happen because the patients were taken to the gym instead of the courtyard today.  Pt denies SI/HI, denies hallucinations, denies pain.  Pt has been isolative to her room for the majority of the night.  She did not attend evening group.    A: Introduced self to pt.  Actively listened to pt and offered support and encouragement. Medication administered per order.  Q15 minute safety checks maintained.  R: Pt is safe on the unit.  When writer brought pt her medication, she reports she took it earlier today and asked if it was scheduled to be given again.  Pt was informed that it is scheduled for bedtime and pt is compliant with medication.  Pt verbally contracts for safety.  Will continue to monitor and assess.

## 2017-06-07 NOTE — [facility] Group Notes (Signed)
LCSW Group Therapy Note  06/07/2017 9:30-10:30AM - 300 Hall, 10:30-11:30 - 400 Hall, 11:30-12:00 - 500 Hall  Type of Therapy and Topic:  Group Therapy: Anger Cues and Responses  Participation Level:  Did Not Attend   Description of Group:   In this group, patients learned how to recognize the physical, cognitive, emotional, and behavioral responses they have to anger-provoking situations.  They identified a recent time they became angry and how they reacted.  They analyzed how their reaction was possibly beneficial and how it was possibly unhelpful.  The group discussed a variety of healthier coping skills that could help with such a situation in the future.  Deep breathing was practiced briefly.  Therapeutic Goals: 1. Patients will remember their last incident of anger and how they felt emotionally and physically, what their thoughts were at the time, and how they behaved. 2. Patients will identify how their behavior at that time worked for them, as well as how it worked against them. 3. Patients will explore possible new behaviors to use in future anger situations. 4. Patients will learn that anger itself is normal and cannot be eliminated, and that healthier reactions can assist with resolving conflict rather than worsening situations.  Summary of Patient Progress:  NA  Therapeutic Modalities:   Cognitive Behavioral Therapy  Lynnell ChadMareida J Grossman-Orr  06/07/2017 8:50 AM

## 2017-06-08 NOTE — [facility] Group Notes (Signed)
[facility] Group Notes:  (Nursing/MHT/Case Management/Adjunct)  Date:  06/08/2017  Time:  1000  Type of Therapy:  Nurse Education - Healthy Support Systems vs. Toxic Relationships  Participation Level:  Did Not Attend  Participation Quality:    Affect:    Cognitive:    Insight:    Engagement in Group:    Modes of Intervention:    Summary of Progress/Problems: Patient was invited and encouraged to attend group however patient refused.   Lawrence MarseillesFriedman, Taro Hidrogo Eakes 06/08/2017, 10:39 AM

## 2017-06-08 NOTE — Progress Notes (Signed)
Pt presents with a flat affect and labile mood. Pt easily agitated on approach. When writer offered pt morning medications, pt only agreed to taking Metformin and Cozaar. Pt refused to allow staff to assess v/s. Writer held Cozaar due to pt refusing v/s. When writer explained to pt what a force medication order meant as requested from pt's brother, per prior nurse, pt became increasingly agitated. Pt started yelling at Clinical research associatewriter and threatened a lawsuit against this Clinical research associatewriter for talking about forcing meds. Pt is paranoid and is suspicious of taking meds. Medications reviewed with pt. Verbal support offered. 15 minute checks performed for safety.

## 2017-06-08 NOTE — Progress Notes (Signed)
Nursing Progress Note: 7p-7a D: Pt currently presents with a loose associations/blaming others/agitated/tangential/derealization/delusional affect and behavior. Pt states "I would have you stop entering my room every 10 minutes. I know the men are just trying to keep awake. I do not appreciate as I am a veteran and I demand to not be checked on like a child. That is all I have nothing else to say to you." Not interacting with the milieu. Pt reports poor sleep during the previous night with current medication regimen.   A: Pt provided with medications per providers orders. Pt's labs and vitals were monitored throughout the night. Pt supported emotionally and encouraged to express concerns and questions. Pt educated on medications.  R: Pt's safety ensured with 15 minute and environmental checks. Pt currently denies SI, HI, and AVH. Pt verbally contracts to seek staff if SI,HI, or AVH occurs and to consult with staff before acting on any harmful thoughts. Will continue to monitor.

## 2017-06-08 NOTE — Progress Notes (Signed)
Pt refused CBG this morning.
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
## 2017-06-08 NOTE — [facility] Group Notes (Signed)
[facility]  Date/Time:  06/08/2017  11:00AM-12:00PM  Type of Therapy and Topic:  Group Therapy:  Music and Mood  Participation Level:  Active   Description of Group: In this process group, members listened to a variety of genres of music and identified that different types of music evoke different responses.  Patients were encouraged to identify music that was soothing for them and music that was energizing for them.  Patients discussed how this knowledge can help with wellness and recovery in various ways including managing depression and anxiety as well as encouraging healthy sleep habits.    Therapeutic Goals: 1. Patients will explore the impact of different varieties of music on mood 2. Patients will verbalize the thoughts they have when listening to different types of music 3. Patients will identify music that is soothing to them as well as music that is energizing to them 4. Patients will discuss how to use this knowledge to assist in maintaining wellness and recovery 5. Patients will explore the use of music as a coping skill  Summary of Patient Progress:  At the beginning of group, patient expressed being sleepy and at the end felt energized.  She was very focused on analyzing the music, in particular comparing it to pictures that she was visualizing.  She was very appropriate throughout group.  Therapeutic Modalities: Solution Focused Brief Therapy Activity   Ambrose MantleMareida Grossman-Orr, LCSW

## 2017-06-08 NOTE — Progress Notes (Signed)
Pt attended afternoon psyched group with mht on healthy support systems and stated that her goal is to work on maintaining a healthy (fit) mind, body, and soul. She stated she will do so by walking 45 minutes a day and using her family (brother) and church members as support when she feels herself not reaching her goal.
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
## 2017-06-09 NOTE — Progress Notes (Signed)
Pt presents with a flat affect and labile mood. Pt easily agitated and threatening on approach. Pt thoughts are disorganized, speech is tangential and pt appears to be paranoid. Pt stated to writer, "I'm not suppose to be here, my brother is going to sue the system". "No you cannot take my blood pressure because that machine is contagious". Pt refused cbg, labs, v/s and meds. MD made aware in tx team. Pt observed attending morning group.Medications reviewed with pt. Verbal support offered. Pt encouraged to attend groups. 15 minute checks performed for safety.

## 2017-06-09 NOTE — Progress Notes (Signed)
CSW spoke with the patient's brother/legal guardian, Clenton Parelbert LePaige ([phone]) regarding medical records he wanted to send to the patient's CSW and MD. CSW informed Mr. Monico HoarLePaige that his e-mail was received and that the CSW will share the  information with Dr. Altamese [place] tomorrow morning.   Per Mr. Monico HoarLePaige, he wanted to share the [facility] so that the patient's records could assist/provide information regarding the patient's past treatments.  CSW thanked Mr. Simmie DaviesLePaig and provided an  update regarding his sister's current treatment plan.   Mr. Monico HoarLePaige thanked the CSW and did not have any further questions or concerns at this time.   CSW will continue to follow.    Baldo DaubJolan Turki Tapanes, MSW, LCSWA Clinical Social Worker [facility]  Phone: [phone]

## 2017-06-09 NOTE — Progress Notes (Signed)
Pt refused morning labs, meds, vitals, and CBG. Will continue to monitor.

## 2017-06-09 NOTE — Progress Notes (Signed)
Adult Psychoeducational Group Note  Date:  06/09/2017 Time:  10:01 PM  Group Topic/Focus:  Wrap-Up Group:   The focus of this group is to help patients review their daily goal of treatment and discuss progress on daily workbooks.  Participation Level:  Minimal  Participation Quality:  Appropriate  Affect:  Appropriate  Cognitive:  Oriented  Insight: Limited  Engagement in Group:  Engaged  Modes of Intervention:  Socialization and Support  Additional Comments:  Patient attended and participated in group tonight. She reports having a nice day.   Lita MainsFrancis, Brandilee Pies [facility] 06/09/2017, 10:01 PM

## 2017-06-09 NOTE — Plan of Care (Signed)
  Progressing Activity: Interest or engagement in activities will improve 06/09/2017 1238 - Progressing by Layla BarterWhite, Maren Wiesen L, RN

## 2017-06-09 NOTE — Progress Notes (Signed)
Nursing Progress Note: 7p-7a D: Pt currently presents with a irritable/anxious/tangential/paranoid/disorganized/isolative affect and behavior. Pt states "My brother works for the [employer] and [employer] and I'm a Cytogeneticistveteran. We demand that you do not do 10 minute checks on me. I am filing a huge law suit against you all. I'm going to shut this place down for invading my privacy by checking on me." Interacting minimally with the milieu. Pt reports fair sleep during the previous night with current medication regimen. Pt did attend wrap-up group.  A: Pt provided with medications per providers orders. Pt's labs and vitals were monitored throughout the night. Pt supported emotionally and encouraged to express concerns and questions. Pt educated on medications.  R: Pt's safety ensured with 15 minute and environmental checks. Pt currently denies SI, HI, and AVH. Pt verbally contracts to seek staff if SI,HI, or AVH occurs and to consult with staff before acting on any harmful thoughts. Will continue to monitor.
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
## 2017-06-09 NOTE — Progress Notes (Signed)
Recreation Therapy Notes  Date: 06/09/17 Time: 1000 Location: 500 Hall Dayroom  Group Topic: Leisure Education, Goal Setting  Goal Area(s) Addresses:  Patient will be able to identify at least 3 goals.   Patient will be able to identify benefit of investing in goals.  Patient will be able to identify benefit of setting goals.   Behavioral Response:  Engaged  Intervention: Worksheet  Activity: Garment/textile technologistGoal Planning.  Patients were given a worksheet in which they were to identify a goal they wanted to complete within the next week, next month, next year and in five years.  Patients were to then identify the obstacles that are preventing them from reaching their goals, what they need to achieve their goals and what they could start doing today to work towards their goals.  Education:  Discharge Planning, PharmacologistCoping Skills, Leisure Education   Education Outcome: Acknowledges Education/In Group Clarification Provided/Needs Additional Education  Clinical Observations: Pt stated she could start being positive within the next week, help others in the next month and volunteer more in the next year.  Pt stated her obstacle to reaching her goal was "attending groups", pt stated she needed to "drink coffee for energy and eat chocolate cookies" to achieve goals and what she could work on right now is "keep clean, be in a pleasant mood and be pleasant to others".   Caroll RancherMarjette Leandra Vanderweele, [name]     Lillia AbedLindsay, Kaelin Bonelli A 06/09/2017 12:12 PM

## 2017-06-10 NOTE — Progress Notes (Signed)
Recreation Therapy Notes  Date: 06/10/17 Time: 1000 Location: 500 Hall Dayroom  Group Topic: Communication  Goal Area(s) Addresses:  Patient will effectively communicate with peers in group.  Patient will verbalize benefit of healthy communication. Patient will verbalize positive effect of healthy communication on post d/c goals.  Patient will identify communication techniques that made activity effective for group.   Intervention: Merchandiser, retailGeometrical drawings, blank paper, pencils    Activity: Geometrical Drawings.  One patient would describe a picture to the rest of the group.  The group would then draw the picture being described to them on their own individual sheets of paper.  The drawers could not ask any questions of the reader.  When completed, the group would examine their drawings against the original to see how close they came.  Education: Communication, Discharge Planning  Education Outcome: Acknowledges understanding/In group clarification offered/Needs additional education.   Clinical Observations/Feedback: Pt did not attend group.    Caroll RancherMarjette Elizah Mierzwa, [name]         Caroll RancherLindsay, Kenyon Eshleman A 06/10/2017 11:53 AM

## 2017-06-10 NOTE — Progress Notes (Signed)
DAR NOTE: Patient presents with anxious affect and irritable mood.  Patient is withdrawn and isolates to her room.  Denies pain, auditory and visual hallucinations.  Described energy level as normal and concentration as good.  Rates depression at 0, hopelessness at 0, and anxiety at 0.  Maintained on routine safety checks.  Patient continues to refuse some of her prescribed medications.  MD made aware.  Support and encouragement offered as needed. Offered no complaint.

## 2017-06-10 NOTE — Progress Notes (Signed)
Did not attend group 
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
## 2017-06-10 NOTE — [facility] Group Notes (Signed)
[facility] LCSW Group Therapy Note  Date/Time: 06/10/17, 1100  Type of Therapy/Topic:  Group Therapy:  Feelings about Diagnosis  Participation Level:  Did Not Attend   Mood:   Description of Group:    This group will allow patients to explore their thoughts and feelings about diagnoses they have received. Patients will be guided to explore their level of understanding and acceptance of these diagnoses. Facilitator will encourage patients to process their thoughts and feelings about the reactions of others to their diagnosis, and will guide patients in identifying ways to discuss their diagnosis with significant others in their lives. This group will be process-oriented, with patients participating in exploration of their own experiences as well as giving and receiving support and challenge from other group members.   Therapeutic Goals: 1. Patient will demonstrate understanding of diagnosis as evidence by identifying two or more symptoms of the disorder:  2. Patient will be able to express two feelings regarding the diagnosis 3. Patient will demonstrate ability to communicate their needs through discussion and/or role plays  Summary of Patient Progress:        Therapeutic Modalities:   Cognitive Behavioral Therapy Brief Therapy Feelings Identification   Greg Matraca Hunkins, LCSW 

## 2017-06-10 NOTE — Progress Notes (Signed)
Pt refused meds and CBG. Will continue to monitor.

## 2017-06-10 NOTE — Progress Notes (Signed)
Nursing Progress Note: 7p-7a D: Pt currently presents with a irritable/anxious/tangential/paranoid/disorganized/isolative affect and behavior. Pt states "I don't want your fucking help. Stay out of my way." Interacting minimally with the milieu. Pt reports fair sleep during the previous night with current medication regimen. Pt did attend wrap-up group.  A: Pt provided with medications per providers orders. Pt's labs and vitals were monitored throughout the night. Pt supported emotionally and encouraged to express concerns and questions. Pt educated on medications.  R: Pt's safety ensured with 15 minute and environmental checks. Pt currently denies SI, HI, and AVH. Pt verbally contracts to seek staff if SI,HI, or AVH occurs and to consult with staff before acting on any harmful thoughts. Will continue to monitor.

## 2017-06-11 NOTE — Progress Notes (Signed)
Patient refused all vital signs today.  Stated her brother is going to sue the hospital and she does not need to have her vital signs taken.

## 2017-06-11 NOTE — Tx Team (Signed)
Interdisciplinary Treatment and Diagnostic Plan Update  06/11/2017 Time of Session:10:00AM  Sherri [name] MRN: [id number]  Principal Diagnosis: Schizophrenia, disorganized, chronic (HCC)  Secondary Diagnoses: Principal Problem:   Schizophrenia, disorganized, chronic (HCC)   Current Medications:  Current Facility-Administered Medications  Medication Dose Route Frequency Provider Last Rate Last Dose  . acetaminophen (TYLENOL) tablet 650 mg  650 mg Oral Q6H PRN Ethelene Hal, NP      . alum & mag hydroxide-simeth (MAALOX/MYLANTA) 200-200-20 MG/5ML suspension 30 mL  30 mL Oral Q4H PRN Ethelene Hal, NP      . calcium-vitamin D (OSCAL WITH D) 500-200 MG-UNIT per tablet 1 tablet  1 tablet Oral Q breakfast Ethelene Hal, NP   1 tablet at 06/11/17 0736  . gabapentin (NEURONTIN) capsule 200 mg  200 mg Oral BID Ethelene Hal, NP      . hydrOXYzine (ATARAX/VISTARIL) tablet 25 mg  25 mg Oral Q8H Ethelene Hal, NP   25 mg at 06/10/17 1304  . OLANZapine zydis (ZYPREXA) disintegrating tablet 10 mg  10 mg Oral Q8H PRN Pennelope Bracken, MD       And  . LORazepam (ATIVAN) tablet 1 mg  1 mg Oral PRN Pennelope Bracken, MD       And  . ziprasidone (GEODON) injection 20 mg  20 mg Intramuscular PRN Pennelope Bracken, MD      . losartan (COZAAR) tablet 100 mg  100 mg Oral Daily Ethelene Hal, NP   100 mg at 06/11/17 0737  . magnesium hydroxide (MILK OF MAGNESIA) suspension 30 mL  30 mL Oral Daily PRN Ethelene Hal, NP      . metFORMIN (GLUCOPHAGE) tablet 500 mg  500 mg Oral BID WC Ethelene Hal, NP   500 mg at 06/11/17 0740  . paliperidone (INVEGA) 24 hr tablet 6 mg  6 mg Oral Daily Pennelope Bracken, MD   6 mg at 06/10/17 0802  . traZODone (DESYREL) tablet 50 mg  50 mg Oral QHS PRN Pennelope Bracken, MD        PTA Medications: Medications Prior to Admission  Medication Sig Dispense Refill Last Dose  .  acetaminophen (TYLENOL) 500 MG tablet Take 1,000 mg by mouth every 8 (eight) hours as needed for mild pain.   unk  . calcium-vitamin D (OSCAL WITH D) 500-200 MG-UNIT tablet Take 1 tablet by mouth daily with breakfast.   Past Week at Unknown time  . chlorhexidine (PERIDEX) 0.12 % solution Use as directed 15 mLs in the mouth or throat 2 (two) times daily.   Past Week at Unknown time  . cholecalciferol (VITAMIN D) 1000 units tablet Take 2,000 Units by mouth daily.   Past Week at Unknown time  . Cinnamon 500 MG capsule Take 500 mg by mouth daily.   Past Week at Unknown time  . Cranberry (CRANBERRY CONCENTRATE) 500 MG CAPS Take 1 capsule by mouth daily.   Past Week at Unknown time  . ferrous sulfate 325 (65 FE) MG tablet Take 325 mg by mouth daily with breakfast.   Past Week at Unknown time  . guaifenesin (HUMIBID E) 400 MG TABS tablet Take 400 mg by mouth every 8 (eight) hours as needed (mucus relief).   unk  . guaiFENesin-dextromethorphan (ROBITUSSIN DM) 100-10 MG/5ML syrup Take 10 mLs by mouth every 4 (four) hours as needed for cough.   unk  . hydrOXYzine (ATARAX/VISTARIL) 25 MG tablet Take 1 tablet (25 mg total)  by mouth every 6 (six) hours as needed for anxiety. (Patient taking differently: Take 25 mg by mouth every 8 (eight) hours. ) 30 tablet 0 Past Week at Unknown time  . loratadine (CLARITIN) 10 MG tablet Take 10 mg by mouth daily as needed for allergies.   unk at Unknown time  . losartan (COZAAR) 100 MG tablet Take 100 mg by mouth daily.   Past Week at Unknown time  . metFORMIN (GLUCOPHAGE) 500 MG tablet Take 500 mg by mouth 2 (two) times daily with a meal.   Past Week at Unknown time  . Multiple Vitamin (MULTIVITAMIN WITH MINERALS) TABS tablet Take 1 tablet by mouth daily.   Past Week at Unknown time  . risperiDONE (RISPERDAL) 0.5 MG tablet Take 1 tablet (0.5 mg total) by mouth 2 (two) times daily. 60 tablet 0 Past Week at Unknown time    Treatment Modalities: Medication Management, Group  therapy, Case management,  1 to 1 session with clinician, Psychoeducation, Recreational therapy.  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  Therapeutic Interventions: 1 to 1 sessions, Unit Group sessions and Medication administration.  Evaluation of Outcomes: Not Met  Physician Treatment Plan for Secondary Diagnosis: Principal Problem:   Schizophrenia, disorganized, chronic ([place])  Long Term Goal(s): Improvement in symptoms so as ready for discharge  Short Term Goals: Ability to identify and develop effective coping behaviors will improve Compliance with prescribed medications will improve Ability to identify and develop effective coping behaviors will improve  Medication Management: Evaluate patient's response, side effects, and tolerance of medication regimen.  Therapeutic Interventions: 1 to 1 sessions, Unit Group sessions and Medication administration.  Evaluation of Outcomes: Not Met   RN Treatment Plan for Primary Diagnosis: Schizophrenia, disorganized, chronic (HCC) Long Term Goal(s): Knowledge of disease and therapeutic regimen to maintain health will improve  Short Term Goals: Ability to participate in decision making will improve, Ability to identify and develop effective coping behaviors will improve and Compliance with prescribed medications will improve  Medication Management: RN will administer  medications as ordered by provider, will assess and evaluate patient's response and provide education to patient for prescribed medication. RN will report any adverse and/or side effects to prescribing provider.  Therapeutic Interventions: 1 on 1 counseling sessions, Psychoeducation, Medication administration, Evaluate responses to treatment, Monitor vital signs and CBGs as ordered, Perform/monitor CIWA, COWS, AIMS and Fall Risk screenings as ordered, Perform wound care treatments as ordered.  Evaluation of Outcomes: Not Met   LCSW Treatment Plan for Primary Diagnosis: Schizophrenia, disorganized, chronic ([place]) Long Term Goal(s): Safe transition to appropriate next level of care at discharge, Engage patient in therapeutic group addressing interpersonal concerns.  Short Term Goals: Engage patient in aftercare planning with referrals and resources, Facilitate acceptance of mental health diagnosis and concerns, Identify triggers associated with mental health/substance abuse issues and Increase skills for wellness and recovery  Therapeutic Interventions: Assess for all discharge needs, 1 to 1 time with Social worker, Explore available resources and support systems, Assess for adequacy in community support network, Educate family and significant other(s) on  suicide prevention, Complete Psychosocial Assessment, Interpersonal group therapy.  Evaluation of Outcomes: Not Met   Progress in Treatment: Attending groups: Yes Participating in groups: Yes Taking medication as prescribed: Sometimes Toleration of medication: Yes, no side effects reported at this time Family/Significant other contact made: Marval Regal [phone] (Brother/Legal Guardian) Patient understands diagnosis: No, limited insight Discussing patient identified problems/goals with staff: Yes Medical problems stabilized or resolved: Yes Denies suicidal/homicidal ideation: Yes Issues/concerns per patient self-inventory:  None Other: N/A  New problem(s) identified: None identified at this time.   New Short Term/Long Term Goal(s): "I'm not supposed to be here.  I am supposed to be evaluated for a medication adjustment at the [place]".   Discharge Plan or Barriers: Upon discharge pt will return to [facility] [place] and will follow up at [facility] and with the [place].   Reason for Continuation of Hospitalization: Delusions  Hallucinations Medication stabilization   Estimated Length of Stay: 3-5 days.  Attendees: Patient: 06/11/2017   Physician: Dr Nancy Fetter, MD 06/11/2017   Nursing: Elesa Massed, RN 06/11/2017   RN Care Manager: 06/11/2017   Social Worker: Lurline Idol, LCSW 06/11/2017   Recreational Therapist:    Other: Stephanie Acre, intern 06/11/2017   Other:  06/11/2017   Other: 06/11/2017      Scribe for Treatment Team: Joanne Chars, [place] 06/11/2017 10:22 AM

## 2017-06-11 NOTE — [facility] Group Notes (Signed)
[facility] LCSW Group Therapy Note  Date/Time: 06/11/17, 1315  Type of Therapy and Topic:  Group Therapy:  Overcoming Obstacles  Participation Level:  Did not attend  Description of Group:    In this group patients will be encouraged to explore what they see as obstacles to their own wellness and recovery. They will be guided to discuss their thoughts, feelings, and behaviors related to these obstacles. The group will process together ways to cope with barriers, with attention given to specific choices patients can make. Each patient will be challenged to identify changes they are motivated to make in order to overcome their obstacles. This group will be process-oriented, with patients participating in exploration of their own experiences as well as giving and receiving support and challenge from other group members.  Therapeutic Goals: 1. Patient will identify personal and current obstacles as they relate to admission. 2. Patient will identify barriers that currently interfere with their wellness or overcoming obstacles.  3. Patient will identify feelings, thought process and behaviors related to these barriers. 4. Patient will identify two changes they are willing to make to overcome these obstacles:    Summary of Patient Progress      Therapeutic Modalities:   Cognitive Behavioral Therapy Solution Focused Therapy Motivational Interviewing Relapse Prevention Therapy  Greg Tanish Sinkler, LCSW 
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
## 2017-06-11 NOTE — Progress Notes (Signed)
Adult Psychoeducational Group Note  Date:  06/11/2017 Time:  4:41 PM  Group Topic/Focus:  Developing a Wellness Toolbox:   The focus of this group is to help patients develop a "wellness toolbox" with skills and strategies to promote recovery upon discharge.  Participation Level:  Did Not Attend    Clarene Critchleylizabeth O Audris Speaker 06/11/2017, 4:41 PM

## 2017-06-11 NOTE — Progress Notes (Signed)
Recreation Therapy Notes  Date: 06/11/17 Time: 1000 Location: 500 Hall Dayroom  Group Topic: Coping Skills  Goal Area(s) Addresses:  Patient will be able to identify positive coping skills. Patient will be able to identify benefits of using coping skills post d/c.  Intervention: Worksheet, pencils   Activity: Web Design.  Patients were to write the things that have had them stuck or stressed inside of the web.  Patients were to then come up with at least three coping skills and write them outside of the web.  Education: Coping Skills, Discharge Planning.   Education Outcome: Acknowledges understanding/In group clarification offered/Needs additional education.   Clinical Observations/Feedback: Pt did not attend group.    Larry Alcock, [name]         Barba Solt A 06/11/2017 12:07 PM 

## 2017-06-11 NOTE — Progress Notes (Signed)
Patient's self inventory sheet, patient has fair sleep, no sleep medication given.  Fair appetite, normal energy level, good concentration.  Denied depression, hopeless, anxiety.  Denied withdrawals.  Denied SI.  Denied physical problems.  Denied pain and no pain medicine.  Goal is:  Sherri [name] (my brother) found out, 4 stays was the legal limit, his attorney at [employer] [place] informed him that [facility] will be charged a thousand dollars a day for this illegal procedure, and my brother will receive and sue for this.  Discharge plans, return by EMS within 24 hrs address [place], [address]  I'm a Biomedical [employer] I am not supposed to be used for [employer].  (My brother has proof of this."

## 2017-06-12 MED ORDER — OLANZAPINE 10 MG PO TBDP: 10.0000 mg | ORAL_TABLET | Freq: Three times a day (TID) | ORAL | Status: DC | PRN

## 2017-06-12 MED ORDER — OLANZAPINE 10 MG IM SOLR
10.0000 mg | Freq: Three times a day (TID) | INTRAMUSCULAR | Status: DC | PRN
  Administered 2017-06-12: 10 mg via INTRAMUSCULAR

## 2017-06-12 MED ORDER — OLANZAPINE 10 MG IM SOLR
10.0000 mg | Freq: Every day | INTRAMUSCULAR | Status: DC
  Filled 2017-06-12 (×15): qty 10

## 2017-06-12 MED ORDER — OLANZAPINE 10 MG PO TBDP
10.0000 mg | ORAL_TABLET | Freq: Every day | ORAL | Status: DC
  Administered 2017-06-12 – 2017-06-24 (×11): 10 mg via ORAL
  Filled 2017-06-12 (×17): qty 1

## 2017-06-12 MED ORDER — OLANZAPINE 10 MG IM SOLR
INTRAMUSCULAR | Status: AC
  Filled 2017-06-12: qty 10
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
## 2017-06-12 NOTE — Progress Notes (Signed)
D:Patient observed resting in bed with eyes closed. Respirations even and non labored.  A:Q 15 minute checks remain in progress for safety R:Patient remains safe on unit. 

## 2017-06-12 NOTE — Progress Notes (Signed)
Recreation Therapy Notes  Date: 06/12/17 Time: 1015 Location: 500 Hall Dayroom  Group Topic: Leisure Education  Goal Area(s) Addresses:  Patient will identify positive leisure activities.  Patient will identify one positive benefit of participation in leisure activities.   Intervention: Dry erase marker, white board  Activity: Pictionary.  The group was divided into two teams.  One person from the team would come to the board and draw the picture.  LRT would tell the person what to draw.  Each team would get one minute to guess the picture.  If the team did not guess the picture, the opposing team would get a chance to steal.  Education: Leisure Education, Discharge Planning  Education Outcome: Acknowledges education/In group clarification offered/Needs additional education  Clinical Observations/Feedback: Pt did not attend group.     Caroll RancherMarjette Lilla Callejo, [name]          Caroll RancherLindsay, Gordie Belvin A 06/12/2017 12:24 PM

## 2017-06-12 NOTE — Progress Notes (Signed)
Did not attend group 

## 2017-06-12 NOTE — Progress Notes (Signed)
Patient refused finger stick

## 2017-06-12 NOTE — Progress Notes (Signed)
CSW spoke to brother/legal guardian Sherri [name], who requested an update on the plan moving forward as he had heard that pt had again refused her medication after taking it two days in a row.  CSW spoke with Dr Altamese Carolinaainville who reports pt was given forced med, zyprexa, this AM.  Moving forward, forced med will be used if pt refuses.  Sherri [name] has been discontinued.  CSW called Sherri [name] back and updated him.  He was very relieved that medication will be given either way and asked CSW to tell Dr Altamese Carolinaainville that he is in agreement with this plan. Sherri [name], MSW, LCSW Clinical Social Worker 06/12/2017 1:16 PM

## 2017-06-12 NOTE — Progress Notes (Signed)
DAR NOTE: Patient presents with anxious affect and irritable mood.  Denies pain, auditory and visual hallucinations.  Patient again refused to take all prescribed medications after several encouragements.  Argumentative with staff and MD.  Schuyler AmorVerbally and physically threatening staff.  Patient remained delusional, disorganized and paranoid.  Rates depression at 0, hopelessness at 0, and anxiety at 0.  Maintained on routine safety checks.   Zyprexa 10 mg given IM per MD with good effect.  Patient observed pacing the unit with an angry affect. Patient is safe on the unit.

## 2017-06-13 NOTE — Progress Notes (Signed)
Patient refused finger stick for blood sugar value.
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
## 2017-06-13 NOTE — Progress Notes (Signed)
Recreation Therapy Notes  Date: 06/13/17 Time: 1010 Location: [address] Dayroom  Group Topic: Music  Goal Area(s) Addresses:   Patient will verbalize benefit of listening to music. Patient will verbalize positive effect of using music post d/c.   Intervention:  Music  Activity:  Music Therapy.  LRT played soothing music in the dayroom, creating a peaceful environment for the patients.   Education: Music, Discharge Planning  Education Outcome: Acknowledges understanding/In group clarification offered/Needs additional education.   Clinical Observations/Feedback: Pt did not attend group.     Caroll RancherMarjette Shaundrea Carrigg, [name]         Caroll RancherLindsay, Yavonne Kiss A 06/13/2017 12:25 PM

## 2017-06-13 NOTE — [facility] Group Notes (Signed)
Pt did not attend chaplain group today. Tedrick Port, MSW, LCSW Clinical Social Worker 06/13/2017 3:09 PM  

## 2017-06-13 NOTE — Progress Notes (Signed)
D: Pt remains reclusive to her room. Patient did not participate in group. Patient was not argumentative with this Buyer, retailwriter tonight.  A: Pt was offered support and encouragement. Pt was given scheduled medications. Pt was encourage to attend groups. Q 15 minute checks were done for safety.  R:Pt  Did not attend groups. . Pt is taking medication. Pt has no complaints.patient remains safe on unit.

## 2017-06-13 NOTE — Progress Notes (Signed)
DAR NOTE: Patient presents with anxious affect and depressed mood.  Denies pain, auditory and visual hallucinations.  Rates depression at 0, hopelessness at 0, and anxiety at 0.  Patient was verbally aggressive and abusive towards MD during assessment.  Maintained on routine safety checks.  Medications given as prescribed.  Support and encouragement offered as needed. Patient visible in the dayroom interacting with peers in milieu and on the unit.  Offered no complaint.

## 2017-06-14 NOTE — [facility] Group Notes (Signed)
[facility] Group Notes:  (Nursing/MHT/Case Management/Adjunct)  Date:  06/14/2017  Time:  7:29 PM Type of Therapy:  Psychoeducational Skills  Participation Level:  Did Not Attend  Participation Quality:  DID NOT ATTEND  Affect:  DID NOT ATTEND  Cognitive:  DID NOT ATTEND  Insight:  None  Engagement in Group:  DID NOT ATTEND  Modes of Intervention:  DID NOT ATTEND  Summary of Progress/Problems: Pt did not attend patient self inventory group.   Bethann PunchesJane O Rainna Nearhood 06/14/2017, 7:29 PM
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
## 2017-06-14 NOTE — Progress Notes (Signed)
Did not attend group 

## 2017-06-14 NOTE — [facility] Group Notes (Signed)
LCSW Group Therapy Note  06/14/2017 9:30-10:30AM - [address], 10:30-11:30 - [address], 11:30-12:00 - 500 [address]  Type of Therapy and Topic:  Group Therapy: Anger Cues and Responses  Participation Level:  Did Not Attend   Description of Group:   In this group, patients learned how to recognize the physical, cognitive, emotional, and behavioral responses they have to anger-provoking situations.  They identified a recent time they became angry and how they reacted.  They analyzed how their reaction was possibly beneficial and how it was possibly unhelpful.  The group discussed a variety of healthier coping skills that could help with such a situation in the future.  Deep breathing was practiced briefly.  Therapeutic Goals: 1. Patients will remember their last incident of anger and how they felt emotionally and physically, what their thoughts were at the time, and how they behaved. 2. Patients will identify how their behavior at that time worked for them, as well as how it worked against them. 3. Patients will explore possible new behaviors to use in future anger situations. 4. Patients will learn that anger itself is normal and cannot be eliminated, and that healthier reactions can assist with resolving conflict rather than worsening situations.  Summary of Patient Progress:  N/A  Therapeutic Modalities:   Cognitive Behavioral Therapy  Sherri [name]  06/14/2017 8:32 AM

## 2017-06-14 NOTE — Progress Notes (Signed)
D: Pt is guarded, presents irritable with brief responses and poor eye contact on interactions. Denies SI, HI, AVH and pain. However, pt was observed talking to self at medication window and in her room when approached. Remains paranoid and delusional when engaged, believes others are out to get her and not acting their "true self". Pt did not attend scheduled groups this shift.  A: All medications given as per MD's order with verbal education and effects monitored. Emotional support and encouragement provided to pt. Safety checks maintained on and off unit without self harm gestures.  R: Pt took her medications PO this shift. Denies adverse drug reactions when assessed "I'm fine".Tolerates all PO intake well. POC continues for safety and mood stability.

## 2017-06-15 NOTE — Progress Notes (Signed)
D:  Pt awake at this time, observed pacing in her room. Denies SSI, HI, AVH and pain. Continues to mumbles / whisper to herself. Still guarded, suspicious and paranoid on interactions. Mood remains irritable but she's cooperative with care with some encouragement. Pt is selectively compliant with medications when offered. Pt walked away from medication window this evening with med in her mouth and ran to room, "I got to pee, I got to pee" and spat it out her Metformin in the trash can in her room. Pt was seen by unit NP who followed her at the time. A: All medications given as ordered with verbal education and effects monitored. Emotional support and availability provided to pt. Encouraged pt to voice concerns and comply with treatment regimen. Safety checks maintained at Q 15 minutes intervals without self harm gestures.  R: Pt observed in afternoon group this shift, participated as well. Cooperative with unit routines and receptive to care. Took her medications without issues. POC continues for safety and mood stability.

## 2017-06-15 NOTE — Progress Notes (Signed)
Writer observed patient lying in bed resting. She was informed that she had scheduled medications to take. She became upset when writer informed her that she could not refuse the zyprexa. Writer explained to her she could take po or injection. She complained that she was going to sue me. She did not attend group. She has been isolative to her room and was verbally aggressive towards Clinical research associatewriter. She took zyprexa po and was offered a snack but refused. Safety maintained on unit with 15 min checks.

## 2017-06-15 NOTE — [facility] Group Notes (Signed)
[facility]  Date/Time:  06/15/2017  11:00AM-12:00PM  Type of Therapy and Topic:  Group Therapy:  Music and Mood  Participation Level:  Active   Description of Group: In this process group, members listened to a variety of genres of music and identified that different types of music evoke different responses.  Patients were encouraged to identify music that was soothing for them and music that was energizing for them.  Patients discussed how this knowledge can help with wellness and recovery in various ways including managing depression and anxiety as well as encouraging healthy sleep habits.    Therapeutic Goals: 1. Patients will explore the impact of different varieties of music on mood 2. Patients will verbalize the thoughts they have when listening to different types of music 3. Patients will identify music that is soothing to them as well as music that is energizing to them 4. Patients will discuss how to use this knowledge to assist in maintaining wellness and recovery 5. Patients will explore the use of music as a coping skill  Summary of Patient Progress:  At the beginning of group, patient expressed she felt relaxed.  She stayed for the entire group and expressed enjoyment, analyzed most of the songs but also was able to identify her feelings.  Her lips moved almost continually throughout AES Corporationgroup/each song, as though responding to internal stimuli.  Therapeutic Modalities: Solution Focused Brief Therapy Activity   Ambrose MantleMareida Grossman-Orr, LCSW

## 2017-06-15 NOTE — [facility] Group Notes (Signed)
Pt was invited but did not attend group. 

## 2017-06-15 NOTE — [facility] Group Notes (Signed)
Pt was invited but did not attend orientation/goals group. 

## 2017-06-15 NOTE — [facility] Group Notes (Signed)
[facility] Group Notes:  (Nursing/MHT/Case Management/Adjunct)  Date:  06/15/2017  Time:  4:00 p.m.  Type of Therapy:  Psychoeducational Skills  Participation Level:  Active  Participation Quality:  Appropriate  Affect:  Appropriate  Cognitive:  Appropriate  Insight:  Appropriate  Engagement in Group:  Engaged  Modes of Intervention:  Socialization  Summary of Progress/Problems:  Patient involved and active in group.    Sherri [name], [name] 06/15/2017, 5:28 PM

## 2017-06-15 NOTE — [facility] Group Notes (Signed)
[facility] Group Notes:  (Nursing/MHT/Case Management/Adjunct)  Date:  06/15/2017  Time:  9:30 a.m.  Type of Therapy:  Psychoeducational Skills  Participation Level:  Did Not Attend  Participation Quality:    Affect:    Cognitive:    Insight:    Engagement in Group:    Modes of Intervention:    Summary of Progress/Problems:  Discussed healthy support system and team building after discharge.    Earline MayotteKnight, Kristan Brummitt Shephard 06/15/2017, 10:34 AM

## 2017-06-15 NOTE — Progress Notes (Signed)
[facility] MD Progress Note  06/15/2017 9:13 AM Sherri [name]  MRN:  [id number]   Subjective:  " I am great"    Objective: Sherri [name] seen resting in bedroom sitting on the bench. Patient present with a brighter affect today. Patient continues to isolate to room. Continues to deny suicidal or homicidal ideation during this assessment. Patient continues to be disorganized and tangential on assessment. NP will discuses with MD regarding force medication order, which will expire today. However patient  is agreeable to take medications by mouth.  Patient to continue with forced medication for delusion, paranoia  and aggression.Denies medication side effects with the Zyprexa. States she is tolerating medications well.  Support, encouragement and reassurance was provided.      Principal Problem: Schizophrenia, disorganized, chronic (HCC) Diagnosis:   Patient Active Problem List   Diagnosis Date Noted  . 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 Schizoaffective disorder, bipolar type (HCC) [F25.0] 05/31/2017  . 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 Bipolar 1 disorder (HCC)   . Hypertension   . Prediabetes   . Schizophrenia (HCC)   . Tachycardia     Past Surgical History:  Procedure Laterality Date  . COLON SURGERY     Family History: History reviewed. No pertinent family history. Family Psychiatric  History: see H&P Social History:  Social History   Substance and Sexual Activity  Alcohol Use No  . Frequency: Never     Social History   Substance and Sexual Activity  Drug Use No    Social History   Socioeconomic History  . Marital status: Divorced    Spouse name: None  . Number of children: None  . Years of education: None  . Highest education level: None  Social  Needs  . Financial resource strain: None  . Food insecurity - worry: None  . Food insecurity - inability: None  . Transportation needs - medical: None  . Transportation needs - non-medical: None  Occupational History  . None  Tobacco Use  . Smoking status: Never Smoker  . Smokeless tobacco: Never Used  Substance and Sexual Activity  . Alcohol use: No    Frequency: Never  . Drug use: No  . Sexual activity: None  Other Topics Concern  . None  Social History Narrative  . None   Additional Social History:                         Sleep: Good  Appetite:  Good  Current Medications: Current Facility-Administered Medications  Medication Dose Route Frequency Provider Last Rate Last Dose  . acetaminophen (TYLENOL) tablet 650 mg  650 mg Oral Q6H PRN Laveda Abbe, NP      . alum & mag hydroxide-simeth (MAALOX/MYLANTA) 200-200-20 MG/5ML suspension 30 mL  30 mL Oral Q4H PRN Laveda Abbe, NP      . calcium-vitamin D (OSCAL WITH D) 500-200 MG-UNIT per tablet 1 tablet  1 tablet Oral Q breakfast Laveda Abbe, NP   1 tablet at 06/15/17 [phone]  . gabapentin (NEURONTIN) capsule 200 mg  200 mg Oral BID Laveda Abbe, NP      . hydrOXYzine (ATARAX/VISTARIL) tablet 25 mg  25 mg Oral Q8H Laveda Abbe, NP   25 mg at 06/15/17 0615  .  losartan (COZAAR) tablet 100 mg  100 mg Oral Daily Laveda Abbe, NP   100 mg at 06/15/17 1610  . magnesium hydroxide (MILK OF MAGNESIA) suspension 30 mL  30 mL Oral Daily PRN Laveda Abbe, NP      . metFORMIN (GLUCOPHAGE) tablet 500 mg  500 mg Oral BID WC Laveda Abbe, NP   500 mg at 06/15/17 0817  . OLANZapine zydis (ZYPREXA) disintegrating tablet 10 mg  10 mg Oral Q8H PRN Micheal Likens, MD       Or  . OLANZapine (ZYPREXA) injection 10 mg  10 mg Intramuscular Q8H PRN Micheal Likens, MD   10 mg at 06/12/17 1058  . OLANZapine zydis (ZYPREXA) disintegrating tablet 10 mg  10 mg  Oral QHS Micheal Likens, MD   10 mg at 06/14/17 2130   Or  . OLANZapine (ZYPREXA) injection 10 mg  10 mg Intramuscular QHS Micheal Likens, MD      . traZODone (DESYREL) tablet 50 mg  50 mg Oral QHS PRN Micheal Likens, MD        Lab Results: No results found for this or any previous visit (from the past 48 hour(s)).  Blood Alcohol level:  Lab Results  Component Value Date   ETH <10 05/30/2017   ETH <10 04/11/2017    Metabolic Disorder Labs: No results found for: HGBA1C, MPG No results found for: PROLACTIN No results found for: CHOL, TRIG, HDL, CHOLHDL, VLDL, LDLCALC  Physical Findings: AIMS: Facial and Oral Movements Muscles of Facial Expression: None, normal Lips and Perioral Area: None, normal Jaw: None, normal Tongue: None, normal,Extremity Movements Upper (arms, wrists, hands, fingers): None, normal Lower (legs, knees, ankles, toes): None, normal, Trunk Movements Neck, shoulders, hips: None, normal, Overall Severity Severity of abnormal movements (highest score from questions above): None, normal Incapacitation due to abnormal movements: None, normal Patient's awareness of abnormal movements (rate only patient's report): No Awareness, Dental Status Current problems with teeth and/or dentures?: No Does patient usually wear dentures?: No  CIWA:    COWS:     Musculoskeletal: Strength & Muscle Tone: within normal limits Gait & Station: normal Patient leans: N/A  Psychiatric Specialty Exam: Physical Exam  Nursing note and vitals reviewed. Constitutional: She appears well-developed.  Neurological: She is alert.  Psychiatric: She has a normal mood and affect. Her behavior is normal.    Review of Systems  Psychiatric/Behavioral: Negative for depression, hallucinations and suicidal ideas. The patient is not nervous/anxious and does not have insomnia.   All other systems reviewed and are negative.   Blood pressure 117/74, pulse 66,  temperature 97.8 F (36.6 C), temperature source Oral, resp. rate 16, height 4' 11.45" (1.51 m), weight 62.1 kg (137 lb), SpO2 97 %.Body mass index is 27.25 kg/m.  General Appearance: Casual and Fairly Groomed  Eye Contact:  Good  Speech:  Clear and Coherent  Volume:  Normal  Mood:  Angry, Dysphoric and Irritable  Affect:  Congruent and Labile  Thought Process:  Coherent, Disorganized and Descriptions of Associations: Loose  Orientation:  Full (Time, Place, and Person)  Thought Content:  Illogical, Delusions, Ideas of Reference:   Paranoia Delusions, Paranoid Ideation and Abstract Reasoning  Suicidal Thoughts:  No  Homicidal Thoughts:  No  Memory:  Immediate;   Fair Recent;   Fair Remote;   Fair  Judgement:  Poor  Insight:  Lacking  Psychomotor Activity:  Normal  Concentration:  Concentration: Fair  Recall:  Fair  Fund of Knowledge:  Fair  Language:  Fair  Akathisia:  No  Handed:    AIMS (if indicated):     Assets:  Manufacturing systems engineerCommunication Skills Physical Health Resilience Social Support  ADL's:  Intact  Cognition:  WNL  Sleep:  Number of Hours: 6.25   Treatment Plan Summary: Daily contact with patient to assess and evaluate symptoms and progress in treatment and Medication management.   Continue with current treatment plan on 06/15/2017 except where noted.   Consider renewing forced medications order  -Schizophrenia  - Continue zyprexa 5mg  po/IM qAM + 10mg  po/IM qhs (only give IM if pt refuses oral form)  -Anxiety/Agitation -Continuegabapentin 200mg  po BID -Continueatarax 25mg  po q8h prn anxiety -Continueagitation protocol with zydis/ativan/geodon  - HTN -Continuelosartan 100mg  po qDay  - DMII -Continuemetformin 500mg  po BID  - Insomnia -Continuetrazodone 50mg  po qhs prn insomnia  -Encourage participation in groups and the therapeutic milieu -Discharge planning  will be ongoing    Oneta Rackanika N Zaydn Gutridge, NP 06/15/2017, 9:13 AM

## 2017-06-16 NOTE — [facility] Group Notes (Signed)
LCSW Group Therapy Note  06/16/2017 1:15pm  Type of Therapy/Topic:  Group Therapy:  Feelings about Diagnosis  Participation Level:  Did Not Attend   Description of Group:   This group will allow patients to explore their thoughts and feelings about diagnoses they have received. Patients will be guided to explore their level of understanding and acceptance of these diagnoses. Facilitator will encourage patients to process their thoughts and feelings about the reactions of others to their diagnosis and will guide patients in identifying ways to discuss their diagnosis with significant others in their lives. This group will be process-oriented, with patients participating in exploration of their own experiences, giving and receiving support, and processing challenge from other group members.   Therapeutic Goals: 1. Patient will demonstrate understanding of diagnosis as evidenced by identifying two or more symptoms of the disorder 2. Patient will be able to express two feelings regarding the diagnosis 3. Patient will demonstrate their ability to communicate their needs through discussion and/or role play  Summary of Patient Progress:       Therapeutic Modalities:   Cognitive Behavioral Therapy Brief Therapy Feelings Identification    Sherri RogueRodney B Warnell Rasnic, LCSW 06/16/2017 1:55 PM

## 2017-06-16 NOTE — Plan of Care (Signed)
Nurse discussed anxiety, coping skills with patient.  

## 2017-06-16 NOTE — Progress Notes (Signed)
D:  Patient refused to fill out self inventory sheet.  Patient denied SI and HI, contracts for safety.  Denied A/V hallucinations.  Denied pain. A:   Patient refused neurotin and metformin this morning.  Emotional support and encouragement given patient. R:  Safety maintained with 15 minute checks.

## 2017-06-16 NOTE — Progress Notes (Signed)
[facility]  06/16/2017 1:56 PM Sherri [name]  MRN:  [id number]  Subjective: Sherri [name] reports, "I'm doing well, sleeping well. But, the staff keep checking on me. That wakes me up in the middle of the night when they do that. I'm, taking my medicines. I'm attending group sessions. The music therapy, I like the best.  I called the music therapy 'interactive therapy' because it tells a healing story. I also enjoy the aroma therapy. I like the ginger bread & peppermint flavored aroma therapy. I do enjoy Cocoa butter scent. I put it in my hair as a conditioner. All these things are sold at [employer]. You should try it. Do you know that my brother has been paid a lot of money in damages because I'm being held against my will here in this hospital". The social reports to the attending psychiatrist that patient's brother needs to know if any changes has been made on the patient treatment regimen.  Sherri [name] is a 62 y/o F with previous treatment for symptoms of psychosis who was admitted involuntarily brought in from her assisted living location with worsening agitation, disorganization, paranoia, and increasingly aggressive behavior. Pt has guardian which is her brother whom lives in the state [place]. Pt has remained fixated with concern that [facility] and there is an ongoing investigation into the matter. Collateral information obtained from pt's brother, Sherri [name], suggests that patient has been decompensating in recent months with worsening paranoia and agitation at her assisted living facility. She had her license removed after she showed up to her dentist's office and refused to leave stating that she had an appointment when she did not. Pt hadbeen non adherent to her outpatientmedications. As per report from pt's facility, she has been verbally aggressive and threatening to others.  Principal Problem: Schizophrenia, disorganized, chronic  (HCC) Diagnosis:   Patient Active Problem List   Diagnosis Date Noted  . 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 Schizoaffective disorder, bipolar type (HCC) [F25.0] 05/31/2017  . 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 Bipolar 1 disorder (HCC)   . Hypertension   . Prediabetes   . Schizophrenia (HCC)   . Tachycardia     Past Surgical History:  Procedure Laterality Date  . COLON SURGERY     Family History: History reviewed. No pertinent family history.  Family Psychiatric  History: See H&P  Social History:  Social History   Substance and Sexual Activity  Alcohol Use No  . Frequency: Never     Social History   Substance and Sexual Activity  Drug Use No    Social History   Socioeconomic History  . Marital status: Divorced    Spouse name: None  . Number of children: None  . Years of education: None  . Highest education level: None  Social Needs  . Financial resource strain: None  . Food insecurity - worry: None  . Food insecurity - inability: None  . Transportation needs - medical: None  . Transportation needs - non-medical: None  Occupational History  . None  Tobacco Use  . Smoking status: Never Smoker  . Smokeless tobacco: Never Used  Substance and Sexual Activity  . Alcohol use: No    Frequency: Never  . Drug use: No  .  Sexual activity: None  Other Topics Concern  . None  Social History Narrative  . None   Additional Social History:   Sleep: Good  Appetite:  Good  Current Medications: Current Facility-Administered Medications  Medication Dose Route Frequency Provider Last Rate Last Dose  . acetaminophen (TYLENOL) tablet 650 mg  650 mg Oral Q6H PRN Laveda Abbe, NP      . alum & mag hydroxide-simeth (MAALOX/MYLANTA) 200-200-20 MG/5ML  suspension 30 mL  30 mL Oral Q4H PRN Laveda Abbe, NP      . calcium-vitamin D (OSCAL WITH D) 500-200 MG-UNIT per tablet 1 tablet  1 tablet Oral Q breakfast Laveda Abbe, NP   1 tablet at 06/16/17 0759  . gabapentin (NEURONTIN) capsule 200 mg  200 mg Oral BID Laveda Abbe, NP      . hydrOXYzine (ATARAX/VISTARIL) tablet 25 mg  25 mg Oral Q8H Laveda Abbe, NP   25 mg at 06/16/17 1610  . losartan (COZAAR) tablet 100 mg  100 mg Oral Daily Laveda Abbe, NP   100 mg at 06/16/17 0759  . magnesium hydroxide (MILK OF MAGNESIA) suspension 30 mL  30 mL Oral Daily PRN Laveda Abbe, NP      . metFORMIN (GLUCOPHAGE) tablet 500 mg  500 mg Oral BID WC Laveda Abbe, NP   500 mg at 06/15/17 0817  . OLANZapine zydis (ZYPREXA) disintegrating tablet 10 mg  10 mg Oral Q8H PRN Sherri Likens, MD       Or  . OLANZapine (ZYPREXA) injection 10 mg  10 mg Intramuscular Q8H PRN Sherri Likens, MD   10 mg at 06/12/17 1058  . OLANZapine zydis (ZYPREXA) disintegrating tablet 10 mg  10 mg Oral QHS Sherri Likens, MD   10 mg at 06/14/17 2130   Or  . OLANZapine (ZYPREXA) injection 10 mg  10 mg Intramuscular QHS Sherri Likens, MD      . traZODone (DESYREL) tablet 50 mg  50 mg Oral QHS PRN Sherri Likens, MD       Lab Results: No results found for this or any previous visit (from the past 48 hour(s)).  Blood Alcohol level:  Lab Results  Component Value Date   ETH <10 05/30/2017   ETH <10 04/11/2017   Metabolic Disorder Labs: No results found for: HGBA1C, MPG No results found for: PROLACTIN No results found for: CHOL, TRIG, HDL, CHOLHDL, VLDL, LDLCALC  Physical Findings: AIMS: Facial and Oral Movements Muscles of Facial Expression: None, normal Lips and Perioral Area: None, normal Jaw: None, normal Tongue: None, normal,Extremity Movements Upper (arms, wrists, hands, fingers): None, normal Lower (legs,  knees, ankles, toes): None, normal, Trunk Movements Neck, shoulders, hips: None, normal, Overall Severity Severity of abnormal movements (highest score from questions above): None, normal Incapacitation due to abnormal movements: None, normal Patient's awareness of abnormal movements (rate only patient's report): No Awareness, Dental Status Current problems with teeth and/or dentures?: No Does patient usually wear dentures?: No  CIWA:  CIWA-Ar Total: 1 COWS:     Musculoskeletal: Strength & Muscle Tone: within normal limits Gait & Station: normal Patient leans: N/A  Psychiatric Specialty Exam: Physical Exam  Nursing note and vitals reviewed.   Review of Systems  Constitutional: Negative for chills and fever.  Respiratory: Negative for cough and shortness of breath.   Cardiovascular: Negative for chest pain.  Gastrointestinal: Negative for abdominal pain, heartburn, nausea and vomiting.  Psychiatric/Behavioral: Negative for depression, hallucinations and  suicidal ideas. The patient is not nervous/anxious and does not have insomnia.     Blood pressure 117/74, pulse 66, temperature 97.8 F (36.6 C), temperature source Oral, resp. rate 16, height 4' 11.45" (1.51 m), weight 62.1 kg (137 lb), SpO2 97 %.Body mass index is 27.25 kg/m.  General Appearance: Casual and Fairly Groomed  Eye Contact:  Good  Speech:  Clear and Coherent and Normal Rate  Volume:  Normal  Mood:  Angry, Dysphoric and Irritable  Affect:  Congruent and Labile  Thought Process:  Coherent, Disorganized and Descriptions of Associations: Loose  Orientation:  Full (Time, Place, and Person)  Thought Content:  Illogical, Delusions, Ideas of Reference:   Paranoia Delusions, Paranoid Ideation and Abstract Reasoning  Suicidal Thoughts:  No  Homicidal Thoughts:  No  Memory:  Immediate;   Fair Recent;   Fair Remote;   Fair  Judgement:  Poor  Insight:  Lacking  Psychomotor Activity:  Normal  Concentration:   Concentration: Fair  Recall:  [employer] of Knowledge:  Fair  Language:  Fair  Akathisia:  No  Handed:    AIMS (if indicated):     Assets:  Manufacturing systems engineer Physical Health Resilience Social Support  ADL's:  Intact  Cognition:  WNL  Sleep:  Number of Hours: 3.25   Treatment Plan Summary: Daily contact with patient to assess and evaluate symptoms and progress in treatment and Medication management. Pt has ongoing delusions, paranoia, and episodes of agitation. She has poor insight and minimal ability to engage with the treatment team. However, is gradually showing some improvement. She will be continued on her current treatment regimen without changes.  -Continue inpatient hospitalization  - Will continue today 06/16/2017 plan as below except where it is noted.  -Schizophrenia  - Continue zyprexa 5mg  po/IM qAM + 10mg  po/IM qhs (only give IM if pt refuses oral form)  -Anxiety/Agitation -Continuegabapentin 200mg  po BID -Continueatarax 25mg  po q8h prn anxiety -Continueagitation protocol with zydis/ativan/geodon  - HTN -Continuelosartan 100mg  po qDay  - DMII -Continuemetformin 500mg  po BID  - Insomnia -Continuetrazodone 50mg  po qhs prn insomnia  -Encourage participation in groups and the therapeutic milieu  -Discharge planning will be ongoing  Armandina Stammer, NP, PMHNP, FNP-BC. 06/16/2017, 1:56 PMPatient ID: Sherri Luis, female   DOB: 1955/06/01, 62 y.o.   MRN: [id number]

## 2017-06-16 NOTE — Progress Notes (Signed)
Recreation Therapy Notes  Date: 06/16/17 Time: 1000 Location: [address] Dayroom  Group Topic: Coping Skills  Goal Area(s) Addresses:  Patients will be able to identify positive coping skills. Patients will be able to identify benefits of using coping skills. Patients will be able to identify benefits of using coping skills post d/c.  Intervention: Worksheet, dry erase board, dry erase marker.  Activity: Mind map.  Each patient was given a blank diagram of a mind map.  LRT and patients filled in the first 8 boxes together with situations were coping skills would be used (finances, overstimulated, anger, being picked on, depression, fatigue, restless and poor nutrition).  Individually, patients were to come up with at least 3 coping skills for each situation.  Group would reconvene and LRT would fill in coping skills on the board.  Education: PharmacologistCoping Skills, Building control surveyorDischarge Planning.   Education Outcome: Acknowledges understanding/In group clarification offered/Needs additional education.   Clinical Observations/Feedback: Pt did not attend group.     Caroll RancherMarjette Kamal Jurgens, [name]        Caroll RancherLindsay, Duwayne Matters A 06/16/2017 12:43 PM

## 2017-06-16 NOTE — Progress Notes (Signed)
[facility] Second Physician Opinion Progress Note for Medication Administration to Non-consenting Patients (For Involuntarily Committed Patients)  Patient: Sherri [name], Sherri [name] Date of Birth: 09-29-1955 MRN: [id number]  Reason for the Medication: The patient, without the benefit of the specific treatment measure, is incapable of participating in any available treatment plan that will give the patient a realistic opportunity of improving the patient'[name] condition.  Consideration of Side Effects: Consideration of the side effects related to the medication plan has been given.  Rationale for Medication Administration: 62 y.o female with history of schizophrenia. She presented with disorganized and paranoid symptoms. She has no insight. She has not been cooperative with care. Her legal guardian is in agreement with forced medication order. Teams goal is to have forced out patient commitment eventually. I agree with forced medication order as she is uncooperative and can potentially put herself at risk.

## 2017-06-17 NOTE — Progress Notes (Signed)
D: Patient observed isolative to room. Resistant to care, interaction. Patient remains hostile. Becomes visibly agitated when this writer asks about hallucinations. Patient then begins to talk about pending lawsuit as she has been here "for 3 weeks. My brother in [place] has notes. And your name will be added. Get ready to pay and pay big. This is illegal what you're doing." Patient's affect animated, angry and agitated with congruent mood. Per self inventory and discussions with writer, rates depression, hopelessness and anxiety all at a 0/10. Rates sleep as fair, appetite as good, energy as normal and concentration as good.  States goal for today is to "return to [facility] [address] [facility] (to reject medications and treatment.) Non [facility] transport and admissions is illegal. Mr Shaune PollackLepage has already received home address money [facility] for this. It is a repeated offense. Return to [facility] by midnight tonight. Mr Shaune PollackLepage has begun the legal process already."  Denies pain, physical complaints.   A: Medicated per orders however patient refuses certain medications. No prns requested or received. Level III obs in place for safety. Emotional support attempted and self inventory reviewed. Encouraged programming participation however she continues to refuse. Discussed POC with MD, SW and MD made aware that patient is refusing CBG, VS.   R: Patient verbalizes some understanding of POC. Patient denies SI/HI/AVH and remains safe on level III obs. Will continue to monitor closely and make verbal contact frequently.

## 2017-06-17 NOTE — Progress Notes (Signed)
Did not attend group 

## 2017-06-17 NOTE — Progress Notes (Signed)
  DATA ACTION RESPONSE  Objective- Pt. is visible in the room, seen pacing and talking to self.Presents with a irritable/agitated/anxious     affect and mood. Pt appears hostile when assessing needs. Pt remains preoccupied over pending lawsuit. Pt take medications with much encouragement.  Subjective- Denies having any SI/HI/AVH/Pain at this time. Is cooperative and remains safe on the unit.  1:1 interaction in private to establish rapport. Encouragement, education, & support given from staff.    Safety maintained with Q 15 checks. Continue with POC.

## 2017-06-17 NOTE — Plan of Care (Signed)
Patient remains labile, hostile and angry. Remains resistant to care.  Patient noncompliant with medications. Takes some however refuses others.
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
## 2017-06-17 NOTE — [facility] Group Notes (Signed)
LCSW Group Therapy Note   06/17/2017 1:15pm   Type of Therapy and Topic:  Group Therapy:  Positive Affirmations   Participation Level:  Did Not Attend  Description of Group: This group addressed positive affirmation toward self and others. Patients went around the room and identified two positive things about themselves and two positive things about a peer in the room. Patients reflected on how it felt to share something positive with others, to identify positive things about themselves, and to hear positive things from others. Patients were encouraged to have a daily reflection of positive characteristics or circumstances.  Therapeutic Goals 1. Patient will verbalize two of their positive qualities 2. Patient will demonstrate empathy for others by stating two positive qualities about a peer in the group 3. Patient will verbalize their feelings when voicing positive self affirmations and when voicing positive affirmations of others 4. Patients will discuss the potential positive impact on their wellness/recovery of focusing on positive traits of self and others. Summary of Patient Progress:    Therapeutic Modalities Cognitive Behavioral Therapy Motivational Interviewing  Sherri RogueRodney B [name], LCSW 06/17/2017 1:20 PM

## 2017-06-17 NOTE — Progress Notes (Signed)
Recreation Therapy Notes  Date: 06/17/17 Time: 1000 Location: 500 Hall Dayroom  Group Topic: Self-Esteem  Goal Area(s) Addresses:  Patient will successfully identify positive attributes about themselves.  Patient will successfully identify benefit of improved self-esteem.   Intervention: Markers, colored pencils, blank picture frame worksheet  Activity: Picture This.  Patients were given a blank sheet with an outline of a picture frame.  Patients were to describe themselves or things they like through pictures, words or a combination of both.  Education: Self-Esteem, Building control surveyorDischarge Planning.   Education Outcome: Acknowledges education/In group clarification offered/Needs additional education  Clinical Observations/Feedback: Pt did not attend group.      Caroll RancherMarjette Kento Gossman, [name]        Caroll RancherLindsay, Mikale Silversmith A 06/17/2017 11:47 AM

## 2017-06-17 NOTE — [facility] Group Notes (Signed)
Patient did not attend group.

## 2017-06-17 NOTE — Plan of Care (Signed)
Patient remains free of injury and self harm while on unit. Patient denies SI/HI. Monitoring continues,

## 2017-06-17 NOTE — Progress Notes (Signed)
D: Pt denies SI/HI. Pt observed in room talking to herself. Patient did take her medications without any difficulty. Patient was pleasant and cooperative.  A: Pt was offered support and encouragement. Pt was given scheduled medications. Pt was encourage to attend groups. Q 15 minute checks were done for safety.  R:Pt did  Not attends groups. Pt is taking medication. Pt has no complaints.Pt receptive to treatment and safety maintained on unit.

## 2017-06-17 NOTE — Progress Notes (Signed)
Patient refused fingerstick this am.

## 2017-06-18 NOTE — Progress Notes (Signed)
  DATA ACTION RESPONSE  Objective- Pt. is visible in the room, seen pacing and talking to self.Presents with a irritable/agitated/anxious     affect and mood. Pt is isolative to room, only going down for meals. Pt appears hostile when assessing needs. Pt remains paranoid and preoccupied. Pt take her medications with much encouragement.  Subjective- Denies having any SI/HI/AVH/Pain at this time. Remains safe on the unit.  1:1 interaction in private to establish rapport. Encouragement, education, & support given from staff.    Safety maintained with Q 15 checks. Continue with POC.

## 2017-06-18 NOTE — Progress Notes (Signed)
Recreation Therapy Notes  Date: 06/18/17 Time: 1000 Location: 500 Hall Dayroom  Group Topic: Anger Management  Goal Area(s) Addresses:  Patient will identify triggers for anger.  Patient will identify physical reaction to anger.   Patient will identify benefit of using coping skills when angry.  Intervention: Worksheet  Activity: Introduction to Anger Management.  Patients were to identify at least 3 things that get them angry, what's their reaction to anger and problems they have run into because of anger.  Education: Anger Management, Discharge Planning   Education Outcome: Acknowledges education/In group clarification offered/Needs additional education.   Clinical Observations/Feedback: Pt did not attend group.     Caroll RancherMarjette Colsen Modi, [name]         Caroll RancherLindsay, Keely Drennan A 06/18/2017 12:59 PM

## 2017-06-18 NOTE — Progress Notes (Addendum)
Pt refused CBG this a.m. provided with encouragement.  
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
## 2017-06-18 NOTE — Plan of Care (Signed)
Patient sleeping per chart. Patient continues to remain isolative, will not participate in treatment.

## 2017-06-18 NOTE — [facility] Group Notes (Signed)
LCSW Group Therapy Note   06/18/2017 1:15pm   Type of Therapy and Topic:  Group Therapy:  Trust and Honesty  Participation Level:  Did Not Attend  Description of Group:    In this group patients will be asked to explore the value of being honest.  Patients will be guided to discuss their thoughts, feelings, and behaviors related to honesty and trusting in others. Patients will process together how trust and honesty relate to forming relationships with peers, family members, and self. Each patient will be challenged to identify and express feelings of being vulnerable. Patients will discuss reasons why people are dishonest and identify alternative outcomes if one was truthful (to self or others). This group will be process-oriented, with patients participating in exploration of their own experiences, giving and receiving support, and processing challenge from other group members.   Therapeutic Goals: 1. Patient will identify why honesty is important to relationships and how honesty overall affects relationships.  2. Patient will identify a situation where they lied or were lied too and the  feelings, thought process, and behaviors surrounding the situation 3. Patient will identify the meaning of being vulnerable, how that feels, and how that correlates to being honest with self and others. 4. Patient will identify situations where they could have told the truth, but instead lied and explain reasons of dishonesty.   Summary of Patient Progress    Therapeutic Modalities:   Cognitive Behavioral Therapy Solution Focused Therapy Motivational Interviewing Brief Therapy  Remmy Riffe B Jernee Murtaugh, LCSW 06/18/2017 1:31 PM  
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
## 2017-06-19 NOTE — [facility] Counselor (Signed)
On Monday I contacted [facility][phone] let me know they were no longer on diversion and I could submit paperwork on pt. Submitted on Monday. Referral not completed until Wed as we were waiting for paperwork from court indicating that IVC was renewed for 21 days. Yesterday afternoon April called to say pt was declined due to acuity on their end and the fact that our pt is refusing some meds and is taking anti-psychotic only under threat of injection. April and CSW to talk on Monday about possibility of resubmission if not on diversion.

## 2017-06-19 NOTE — Progress Notes (Signed)
Psychoeducational Group Note  Date:  06/19/2017 Time:  2026  Group Topic/Focus:  Wrap-Up Group:   The focus of this group is to help patients review their daily goal of treatment and discuss progress on daily workbooks.  Participation Level: Did Not Attend  Participation Quality:  Not Applicable  Affect:  Not Applicable  Cognitive:  Not Applicable  Insight:  Not Applicable  Engagement in Group: Not Applicable  Additional Comments:  The patient did not attend group this evening.   Hazle CocaGOODMAN, Ayrianna Mcginniss S 06/19/2017, 8:26 PM

## 2017-06-19 NOTE — Progress Notes (Signed)
Recreation Therapy Notes  Date: 06/19/17 Time: 0950 Location: [address] Dayroom  Group Topic: Wellness  Goal Area(s) Addresses:  Patient will define components of whole wellness. Patient will verbalize benefit of whole wellness.  Intervention: Music  Activity: Exercise.  LRT went over the importance of exercise and physical activity with patients.  LRT played music and lead patients through a series of stretches before going into more extensive exercises (squats, wall push ups, seated crunches, jogging in place, etc).  Education: Wellness, Discharge Planning.   Education Outcome: Acknowledges education/In group clarification offered/Needs additional education.   Clinical Observations/Feedback: Pt did not attend group.    Cecille Mcclusky, [name]         Quiara Killian A 06/19/2017 11:06 AM 

## 2017-06-19 NOTE — Progress Notes (Signed)
Patient ID: Sherri LuisMichelle S Kingsley, female   DOB: 09-15-1955, 62 y.o.   MRN: [id number]  Pt currently presents with a flat affect and paranoid, guarded behavior. Pt reports ongoing paranoia that [facility] "[facility]" and that "my brother is going to sue everyone here, he is working on a lawsuit." Pt trepidatious about taking medication but complies with regimen. Pt has no interaction with other patients.   Pt provided with medications per providers orders. Pt's labs and vitals were monitored throughout the night. Pt given a 1:1 about emotional and mental status. Pt supported and encouraged to express concerns and questions.   Pt's safety ensured with 15 minute and environmental checks. Pt currently denies SI/HI and A/V hallucinations. Pt verbally agrees to seek staff if SI/HI or A/VH occurs and to consult with staff before acting on any harmful thoughts. No improvement noted in patients delusions, psychosis or behavior. Will continue POC.

## 2017-06-19 NOTE — Progress Notes (Signed)
Patient ID: Sherri [name], female   DOB: 08-18-1955, 62 y.o.   MRN: [id number] D: Patient with disorganized thought processes, was irritable earlier in shift, and had some flight of ideas.  Mood was also observed to be labile, but pt is much calmer currently.  Pt denies SI/HI/AVH.  A: Pt given all meds as scheduled, and is being maintained on Q15 minute safety checks for safety.  R: Pt currently denies having any concerns, will continue to monitor.
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
## 2017-06-19 NOTE — Tx Team (Signed)
Interdisciplinary Treatment and Diagnostic Plan Update  06/19/2017 Time of Session: 10:46 AM  Sherri [name] MRN: [id number]  Principal Diagnosis: Schizophrenia, disorganized, chronic (HCC)  Secondary Diagnoses: Principal Problem:   Schizophrenia, disorganized, chronic (HCC)   Current Medications:  Current Facility-Administered Medications  Medication Dose Route Frequency Provider Last Rate Last Dose  . acetaminophen (TYLENOL) tablet 650 mg  650 mg Oral Q6H PRN Laveda AbbeParks, Laurie Britton, NP      . alum & mag hydroxide-simeth (MAALOX/MYLANTA) 200-200-20 MG/5ML suspension 30 mL  30 mL Oral Q4H PRN Laveda AbbeParks, Laurie Britton, NP      . calcium-vitamin D (OSCAL WITH D) 500-200 MG-UNIT per tablet 1 tablet  1 tablet Oral Q breakfast Laveda AbbeParks, Laurie Britton, NP   1 tablet at 06/19/17 0759  . gabapentin (NEURONTIN) capsule 200 mg  200 mg Oral BID Laveda AbbeParks, Laurie Britton, NP      . hydrOXYzine (ATARAX/VISTARIL) tablet 25 mg  25 mg Oral Q8H Laveda AbbeParks, Laurie Britton, NP   25 mg at 06/19/17 0601  . losartan (COZAAR) tablet 100 mg  100 mg Oral Daily Laveda AbbeParks, Laurie Britton, NP   100 mg at 06/19/17 0759  . magnesium hydroxide (MILK OF MAGNESIA) suspension 30 mL  30 mL Oral Daily PRN Laveda AbbeParks, Laurie Britton, NP      . metFORMIN (GLUCOPHAGE) tablet 500 mg  500 mg Oral BID WC Laveda AbbeParks, Laurie Britton, NP   500 mg at 06/19/17 0759  . OLANZapine zydis (ZYPREXA) disintegrating tablet 10 mg  10 mg Oral Q8H PRN Micheal Likensainville, Christopher T, MD       Or  . OLANZapine (ZYPREXA) injection 10 mg  10 mg Intramuscular Q8H PRN Micheal Likensain[place], Christopher T, MD   10 mg at 06/12/17 1058  . OLANZapine zydis (ZYPREXA) disintegrating tablet 10 mg  10 mg Oral QHS Micheal Likensainville, Christopher T, MD   10 mg at 06/18/17 2046   Or  . OLANZapine (ZYPREXA) injection 10 mg  10 mg Intramuscular QHS Micheal Likensainville, Christopher T, MD      . traZODone (DESYREL) tablet 50 mg  50 mg Oral QHS PRN Micheal Likensainville, Christopher T, MD        PTA Medications: Medications Prior to  Admission  Medication Sig Dispense Refill Last Dose  . acetaminophen (TYLENOL) 500 MG tablet Take 1,000 mg by mouth every 8 (eight) hours as needed for mild pain.   unk  . calcium-vitamin D (OSCAL WITH D) 500-200 MG-UNIT tablet Take 1 tablet by mouth daily with breakfast.   Past Week at Unknown time  . chlorhexidine (PERIDEX) 0.12 % solution Use as directed 15 mLs in the mouth or throat 2 (two) times daily.   Past Week at Unknown time  . cholecalciferol (VITAMIN D) 1000 units tablet Take 2,000 Units by mouth daily.   Past Week at Unknown time  . Cinnamon 500 MG capsule Take 500 mg by mouth daily.   Past Week at Unknown time  . Cranberry (CRANBERRY CONCENTRATE) 500 MG CAPS Take 1 capsule by mouth daily.   Past Week at Unknown time  . ferrous sulfate 325 (65 FE) MG tablet Take 325 mg by mouth daily with breakfast.   Past Week at Unknown time  . guaifenesin (HUMIBID E) 400 MG TABS tablet Take 400 mg by mouth every 8 (eight) hours as needed (mucus relief).   unk  . guaiFENesin-dextromethorphan (ROBITUSSIN DM) 100-10 MG/5ML syrup Take 10 mLs by mouth every 4 (four) hours as needed for cough.   unk  . hydrOXYzine (ATARAX/VISTARIL) 25 MG tablet  Take 1 tablet (25 mg total) by mouth every 6 (six) hours as needed for anxiety. (Patient taking differently: Take 25 mg by mouth every 8 (eight) hours. ) 30 tablet 0 Past Week at Unknown time  . loratadine (CLARITIN) 10 MG tablet Take 10 mg by mouth daily as needed for allergies.   unk at Unknown time  . losartan (COZAAR) 100 MG tablet Take 100 mg by mouth daily.   Past Week at Unknown time  . metFORMIN (GLUCOPHAGE) 500 MG tablet Take 500 mg by mouth 2 (two) times daily with a meal.   Past Week at Unknown time  . Multiple Vitamin (MULTIVITAMIN WITH MINERALS) TABS tablet Take 1 tablet by mouth daily.   Past Week at Unknown time  . risperiDONE (RISPERDAL) 0.5 MG tablet Take 1 tablet (0.5 mg total) by mouth 2 (two) times daily. 60 tablet 0 Past Week at Unknown time     Treatment Modalities: Medication Management, Group therapy, Case management,  1 to 1 session with clinician, Psychoeducation, Recreational therapy.  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  Therapeutic Interventions: 1 to 1 sessions, Unit Group sessions and Medication administration.  Evaluation of Outcomes: Progressing  Physician Treatment Plan for Secondary Diagnosis: Principal Problem:   Schizophrenia, disorganized, chronic (HCC)  Long Term Goal(s): Improvement in symptoms so as ready for discharge  Short Term Goals: Ability to identify and develop effective coping behaviors will improve Compliance with prescribed medications will improve Ability to identify and develop effective coping behaviors will improve  Medication Management: Evaluate patient's response, side effects, and tolerance of medication regimen.  Therapeutic Interventions: 1 to 1 sessions, Unit Group sessions and Medication administration.  Evaluation of Outcomes: Progressing   2/13: Pt remains paranoid, delusional, agitated and aggressive at time. She has poor insight. She has been refusing medications. We will change from Invega to zyprexa (PO or IM if pt refuses PO).   -DiscontinueInvega 6mg  po qDay             - Start zyprexa 5mg  po/IM qAM + 10mg  po/IM qhs (only give IM if pt refuses oral  form)  -Anxiety/Agitation -Continuegabapentin 200mg  po BID -Continueatarax 25mg  po q8h prn anxiety -Continueagitation protocol with zydis/ativan/geodon  - HTN -Continuelosartan 100mg  po qDay  - DMII -Continuemetformin 500mg  po BID  2/21: Pt remains delusional, pressured, labile, and paranoid. She is unwilling to engage with treatment team. We will continue her current medication regimen and continue to investigate possibility of transfer to [place]. Pt was rejected yesterday at [facility] due to their level of acuity.  - Second opinion for forced medications last renewed on 2/18 by Dr. Jackquline Berlin  -Schizophrenia -Continuezyprexa 5mg  po/IM qAM + 10mg  po/IM qhs (only give IM if pt refuses oral form)  -Anxiety/Agitation -Continuegabapentin 200mg  po BID -Continueatarax 25mg  po q8h prn anxiety -Continueagitation protocol with zydis/ativan/geodon  - HTN -Continuelosartan 100mg  po qDay  - DMII -Continuemetformin 500mg  po BID       RN Treatment Plan for Primary Diagnosis: Schizophrenia, disorganized, chronic (HCC) Long Term Goal(s): Knowledge of disease and therapeutic regimen to  maintain health will improve  Short Term Goals: Ability to participate in decision making will improve, Ability to identify and develop effective coping behaviors will improve and Compliance with prescribed medications will improve  Medication Management: RN will administer medications as ordered by provider, will assess and evaluate patient's response and provide education to patient for prescribed medication. RN will report any adverse and/or side effects to prescribing provider.  Therapeutic Interventions: 1 on 1 counseling sessions, Psychoeducation, Medication administration, Evaluate responses to treatment, Monitor vital signs and CBGs as ordered,  Perform/monitor CIWA, COWS, AIMS and Fall Risk screenings as ordered, Perform wound care treatments as ordered.  Evaluation of Outcomes: Progressing   LCSW Treatment Plan for Primary Diagnosis: Schizophrenia, disorganized, chronic (HCC) Long Term Goal(s): Safe transition to appropriate next level of care at discharge, Engage patient in therapeutic group addressing interpersonal concerns.  Short Term Goals: Engage patient in aftercare planning with referrals and resources, Facilitate acceptance of mental health diagnosis and concerns, Identify triggers associated with mental health/substance abuse issues and Increase skills for wellness and recovery  Therapeutic Interventions: Assess for all discharge needs, 1 to 1 time with Social worker, Explore available resources and support systems, Assess for adequacy in community support network, Educate family and significant other(s) on suicide prevention, Complete Psychosocial Assessment, Interpersonal group therapy.  Evaluation of Outcomes: Progressing   Progress in Treatment: Attending groups: No Participating in groups: No Taking medication as prescribed: Yes Toleration of medication: Yes, no side effects reported at this time Family/Significant other contact made: Aaron Edelman [phone] (Brother/Legal Guardian) Patient understands diagnosis: No, limited insight Discussing patient identified problems/goals with staff: Yes Medical problems stabilized or resolved: Yes Denies suicidal/homicidal ideation: Yes Issues/concerns per patient self-inventory: None Other: N/A  New problem(s) identified: None identified at this time.   New Short Term/Long Term Goal(s): "I'm not supposed to be here.  I am supposed to be evaluated for a medication adjustment at the [place]".   Discharge Plan or Barriers: Upon discharge pt will return to [facility] and will follow up at [facility] and with the [place].   Reason for  Continuation of Hospitalization: Delusions  Hallucinations Medication stabilization   Estimated Length of Stay: 06/24/17  Attendees: Patient:  06/19/2017  10:46 AM  Physician: Nelly Rout, MD 06/19/2017  10:46 AM  Nursing: Estella Husk, RN 06/19/2017  10:46 AM  RN Care Manager: Onnie Boer, RN 06/19/2017  10:46 AM  Social Worker: Richelle Ito, LCSW; Melba Coon, Social Work Intern 06/19/2017  10:46 AM  Recreational Therapist: [name], LRT 06/19/2017  10:46 AM  Other: Tomasita Morrow, P4CC 06/19/2017  10:46 AM  Other:  06/19/2017  10:46 AM  Other: 06/19/2017  10:46 AM    Scribe for Treatment Team: Ida Rogue, LCSW 06/19/2017 10:46 AM

## 2017-06-19 NOTE — Progress Notes (Signed)
Adult Psychoeducational Group Note  Date:  06/19/2017 Time:  5:53 PM  Group Topic/Focus:  Crisis Planning:   The purpose of this group is to help patients create a crisis plan for use upon discharge or in the future, as needed.  Participation Level:  Did Not Attend    Clarene Critchleylizabeth O Kurstyn Larios 06/19/2017, 5:53 PM

## 2017-06-20 NOTE — Progress Notes (Signed)
Recreation Therapy Notes  Date: 06/20/17 Time: 1000 Location: [address] Dayroom   Group Topic: Communication, Team Building, Problem Solving  Goal Area(s) Addresses:  Patient will effectively work with peer towards shared goal.  Patient will identify skill used to make activity successful.  Patient will identify how skills used during activity can be used to reach post d/c goals.   Intervention: STEM Activity   Activity: Moon Landing. Patients were provided the following materials: 5 drinking straws, 5 rubber bands, 5 paper clips, 2 index cards, 2 drinking cups, and 2 toilet paper rolls. Using the provided materials patients were asked to build a launching mechanisms to launch a ping pong ball approximately 12 feet. Patients were divided into teams of 3-5.   Education: Social Skills, Discharge Planning.   Education Outcome: Acknowledges education/In group clarification offered/Needs additional education.   Clinical Observations/Feedback: Pt did not attend group.     Byren Pankow, [name]         Yonathan Perrow A 06/20/2017 11:10 AM 
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
## 2017-06-20 NOTE — Progress Notes (Signed)
D: Pt presents guarded, argumentative and irritable at the medication window this AM. "I told you I don't want the Neurontin, I don't like the color and the shape, my brother will sue you, I have the right to refuse it". Pt remains isolative to her room, remains paranoid and delusional on interactions.  A: Scheduled medications given as ordered with verbal education and effects monitored. Emotional support and encouragement provided to pt. Q 15 minutes safety checks maintained without self harm gestures or outburst to report thus far this shift.  R: Pt receptive to care. Attended scheduled unit groups and was engaged. Compliant with medications when offered. Denies adverse drug reactions when assessed. Tolerates all PO intake well. POC maintained for safety and self harm gestures.

## 2017-06-20 NOTE — [facility] Group Notes (Signed)
[facility] LCSW Group Therapy  06/20/2017  1:05 PM  Type of Therapy:  Group therapy  Participation Level:  Invited.  Chose to not attend.  Participation Quality:  Attentive  Affect:  Flat  Cognitive:  Oriented  Insight:  Limited  Engagement in Therapy:  Limited  Modes of Intervention:  Discussion, Socialization  Summary of Progress/Problems:  Chaplain was here to lead a group on themes of hope and courage.  Daryel Geraldorth, Alazae Crymes B 06/20/2017 1:27 PM

## 2017-06-20 NOTE — Progress Notes (Signed)
Patient has been isolative to her room responding to internal stimuli. She was informed of her mediations and was argumentative and spoke about the hospital being sued along with Clinical research associatewriter and other staff for keeping her against her will. She was compliant with medications and writer encouraged her to talk with me until her medication dissolved so that she did not spit it out once returning to her room. She was given snacks and she returned to her room. Safety maintained on unit with 15 min checks.

## 2017-06-21 NOTE — Progress Notes (Signed)
Patient ID: Sherri [name], female   DOB: 07/24/1955, 61 y.o.   MRN: [id number]     D: Pt has been very flat, depressed , and isolative on the unit today. Pt did not attend any groups nor did she engage in any treatment. Pt reported that she just wanted to be left alone. Pt reported that her depression was a 0, her hopelessness was a 0, and her anxiety was a 0. Pt reported that her goal for today was to return back home by EMS. Pt reported being negative SI/HI, no AH/VH noted. A: 15 min checks continued for patient safety. R: Pt safety maintained.    
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
## 2017-06-21 NOTE — [facility] Group Notes (Signed)
[facility] Group Notes:  (Nursing/MHT/Case Management/Adjunct)  Date:  06/21/2017  Time:  2:10 PM  Type of Therapy:  Psychoeducational Skills  Participation Level:  Did Not Attend  Participation Quality:  Did not attend  Affect:  Did not attend  Cognitive:  Did not attend  Insight:  None  Engagement in Group:  Did not attend  Modes of Intervention:  Did not attend  Summary of Progress/Problems: Pt did not attend relaxation group.   Jacquelyne BalintForrest, Meshia Rau Shanta 06/21/2017, 2:10 PM

## 2017-06-21 NOTE — [facility] Group Notes (Signed)
[facility] Group Notes:  (Nursing/MHT/Case Management/Adjunct)  Date:  06/21/2017  Time:  10:23 AM  Type of Therapy:  Orientation/Goals group  Participation Level:  Did Not Attend  Participation Quality:  Did Not Attend  Affect:  Did Not Attend  Cognitive:  Did Not Attend  Insight:  None  Engagement in Group:  Did Not Attend  Modes of Intervention:  Did Not Attend  Summary of Progress/Problems: Pt did not attend patient self inventory group/orientation group.   Jacquelyne BalintForrest, Clyde Zarrella Shanta 06/21/2017, 10:23 AM

## 2017-06-21 NOTE — [facility] Group Notes (Signed)
[facility] Group Notes:  (Nursing/MHT/Case Management/Adjunct)  Date:  06/21/2017  Time:  10:32 AM  Type of Therapy:  Psychoeducational Skills  Participation Level:  Did Not Attend  Participation Quality:  Did not attend  Affect:  Did not attend  Cognitive:  Did not attend  Insight:  None  Engagement in Group:  Did not attend  Modes of Intervention:  Did not attend  Summary of Progress/Problems: Pt did not attend Psychoeducational group with topic anger management.   Jacquelyne BalintForrest, Stephan Draughn Shanta 06/21/2017, 10:32 AM

## 2017-06-21 NOTE — [facility] Group Notes (Signed)
LCSW Group Therapy Note  06/21/2017 9:30-10:30AM - 300 Hall, 10:30-11:30 - 400 Hall, 11:30-12:00 - 500 Hall  Type of Therapy and Topic:  Group Therapy: Anger Cues and Responses  Participation Level:  Did Not Attend   Description of Group:   In this group, patients learned how to recognize the physical, cognitive, emotional, and behavioral responses they have to anger-provoking situations.  They identified a recent time they became angry and how they reacted.  They analyzed how their reaction was possibly beneficial and how it was possibly unhelpful.  The group discussed a variety of healthier coping skills that could help with such a situation in the future.  Deep breathing was practiced briefly.  Therapeutic Goals: 1. Patients will remember their last incident of anger and how they felt emotionally and physically, what their thoughts were at the time, and how they behaved. 2. Patients will identify how their behavior at that time worked for them, as well as how it worked against them. 3. Patients will explore possible new behaviors to use in future anger situations. 4. Patients will learn that anger itself is normal and cannot be eliminated, and that healthier reactions can assist with resolving conflict rather than worsening situations.  Summary of Patient Progress:  N/A  Therapeutic Modalities:   Cognitive Behavioral Therapy  Sherri [name]  06/21/2017 8:46 AM  

## 2017-06-21 NOTE — Progress Notes (Signed)
Did not attend group 

## 2017-06-22 NOTE — Progress Notes (Signed)
Patient ID: Sherri [name], female   DOB: 03-21-1956, 62 y.o.   MRN: [id number]     D: Pt has been very flat, depressed , and isolative on the unit today. Pt did not attend any groups nor did she engage in any treatment. Pt reported that she just wanted to be left alone. Pt reported that her depression was a 0, her hopelessness was a 0, and her anxiety was a 0. Pt reported that her goal for today was to return back home by EMS. Pt reported being negative SI/HI, no AH/VH noted. A: 15 min checks continued for patient safety. R: Pt safety maintained.

## 2017-06-22 NOTE — Progress Notes (Signed)
Patient remains isolative to her room but came to medication window to take her meds when asked. She argues a little but takes her medications. She is observed responding to internal stimuli and continues to remind writer that my bank account will be hacked and I will be sued. Snacks given and she returned to her room. Safety maintained on unit with 15 min checks.

## 2017-06-22 NOTE — Progress Notes (Signed)
Patient ID: Sherri [name], female   DOB: October 16, 1955, 62 y.o.   MRN: [id number]  Nursing Progress Note 1900-0730  D) Patient presents with anxious mood and continues to have paranoia and delusional thinking. Patient is isolative to room but did get up for snacks this evening.Patient denies SI/HI/AVH or pain. Patient contracts for safety on the unit. Patient minimal when writer engages with her but is compliant with scheduled medications without incident.  A) Patient educated about and provided medication as scheduled or requested per provider's orders. Patient safety maintained with q15 min safety checks. Moderate fall risk precautions in place. Emotional support given. 1:1 interaction and active listening provided. Snacks and fluids provided. Labs, vital signs and patient behavior monitored throughout shift. Patient encouraged to work on treatment plan.  R) Patient remains safe on the unit at this time. Patient agrees to make needs known to staff. Will continue to monitor and assess for changes.

## 2017-06-22 NOTE — [facility] Group Notes (Signed)
[facility] Group Notes:  (Nursing/MHT/Case Management/Adjunct)  Date:  06/22/2017  Time:  3:04 PM  Type of Therapy:  Psychoeducational Skills  Participation Level:  Did Not Attend  Participation Quality:  Did not attend  Affect:  Did not attend  Cognitive:  Did not attend  Insight:  None  Engagement in Group:  Did not attend  Modes of Intervention:  Did not attend  Summary of Progress/Problems: Pt did not attend Psychoeducational group with topic love languages.   Jacquelyne BalintForrest, Seriah Brotzman Shanta 06/22/2017, 3:04 PM

## 2017-06-22 NOTE — [facility] Group Notes (Signed)
[facility] Group Notes:  (Nursing/MHT/Case Management/Adjunct)  Date:  06/22/2017  Time:  9:21 AM  Type of Therapy:  Orientation/Goals group  Participation Level:  Did Not Attend  Participation Quality:  Did Not Attend  Affect:  Did Not Attend  Cognitive:  Did Not Attend  Insight:  None  Engagement in Group:  Did Not Attend  Modes of Intervention:  Did Not Attend  Summary of Progress/Problems: Pt did not attend patient self inventory group/orientation group.   Jacquelyne BalintForrest, Hesham Womac Shanta 06/22/2017, 9:21 AM

## 2017-06-22 NOTE — [facility] Group Notes (Signed)
[facility] Group Notes:  (Nursing/MHT/Case Management/Adjunct)  Date:  06/22/2017  Time:  10:19 AM  Type of Therapy:  Psychoeducational Skills  Participation Level:  Did Not Attend  Participation Quality:  Did not attend  Affect:  Did not attend  Cognitive:  Did not attend  Insight:  None  Engagement in Group:  Did not attend  Modes of Intervention:  Did not attend  Summary of Progress/Problems: Pt did not attend Psychoeducational group with topic healthy support systems.   Jacquelyne BalintForrest, Suellen Durocher Shanta 06/22/2017, 10:19 AM
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
## 2017-06-22 NOTE — Progress Notes (Signed)
Adult Psychoeducational Group Note  Date:  06/22/2017 Time:  9:15 PM  Group Topic/Focus:  Wrap-Up Group:   The focus of this group is to help patients review their daily goal of treatment and discuss progress on daily workbooks.  Participation Level:  Minimal  Participation Quality:  Appropriate  Affect:  Defensive  Cognitive:  Oriented  Insight: Limited  Engagement in Group:  Engaged  Modes of Intervention:  Socialization and Support  Additional Comments:  Patient attended and participated in group tonight.   Lita MainsFrancis, Layanna Charo [facility] 06/22/2017, 9:15 PM

## 2017-06-22 NOTE — [facility] Group Notes (Signed)
[facility]  Date/Time:  06/22/2017  11:00AM-12:00PM  Type of Therapy and Topic:  Group Therapy:  Music and Mood  Participation Level:  Active   Description of Group: In this process group, members listened to a variety of genres of music and identified that different types of music evoke different responses.  Patients were encouraged to identify music that was soothing for them and music that was energizing for them.  Patients discussed how this knowledge can help with wellness and recovery in various ways including managing depression and anxiety as well as encouraging healthy sleep habits.    Therapeutic Goals: 1. Patients will explore the impact of different varieties of music on mood 2. Patients will verbalize the thoughts they have when listening to different types of music 3. Patients will identify music that is soothing to them as well as music that is energizing to them 4. Patients will discuss how to use this knowledge to assist in maintaining wellness and recovery 5. Patients will explore the use of music as a coping skill  Summary of Patient Progress:  At the beginning of group, patient expressed she felt "good and perky" and she spoke constantly to herself throughout group, said at the end of group she felt "stable even if it wasn't my type of music."  Therapeutic Modalities: Solution Focused Brief Therapy Activity   Ambrose MantleMareida Grossman-Orr, LCSW

## 2017-06-23 NOTE — Progress Notes (Signed)
[facility] MD Progress Note  06/23/2017 2:04 PM Sherri [name]  MRN:  [id number]  On Evaluation:  Patient is awake and alert, seen resting in bedroom, Patient was evaluated by MD and NP.  Patient continues to present with delusions and paranoid. Patient continues to rumination with suing the hospital and is requesting to be discharged. Patient continues to deny suicidal or homicidal ideations. Patient continues to need encouragement with taken prescribed medications . CW to continue working on discharge disposition to the [place]. Support, encouragement and reassurance was provided.  CW and NP spoke to Legal Guardian Tollie Pizza (bother) on 06/23/2017 regarding mediation adjustment . Consider changing Zyprexa to [facility] for long acting benefit and medication adherence. Bother was agreeable to treatment plan. Of note patient was started on Invega on 2/21.          Principal Problem: Schizophrenia, disorganized, chronic (HCC)  Diagnosis:   Patient Active Problem List   Diagnosis Date Noted  . 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 Schizoaffective disorder, bipolar type (HCC) [F25.0] 05/31/2017  . 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 Bipolar 1 disorder (HCC)   . Hypertension   . Prediabetes   . Schizophrenia (HCC)   . Tachycardia     Past Surgical History:  Procedure Laterality Date  . COLON SURGERY     Family History: History reviewed. No pertinent family history.  Family Psychiatric  History: See H&P  Social History:  Social History   Substance and Sexual Activity  Alcohol Use No  . Frequency: Never     Social History   Substance and Sexual Activity  Drug Use No    Social History   Socioeconomic History  . Marital status: Divorced    Spouse name: None  . Number of  children: None  . Years of education: None  . Highest education level: None  Social Needs  . Financial resource strain: None  . Food insecurity - worry: None  . Food insecurity - inability: None  . Transportation needs - medical: None  . Transportation needs - non-medical: None  Occupational History  . None  Tobacco Use  . Smoking status: Never Smoker  . Smokeless tobacco: Never Used  Substance and Sexual Activity  . Alcohol use: No    Frequency: Never  . Drug use: No  . Sexual activity: None  Other Topics Concern  . None  Social History Narrative  . None   Additional Social History:   Sleep: Good  Appetite:  Good  Current Medications: Current Facility-Administered Medications  Medication Dose Route Frequency Provider Last Rate Last Dose  . acetaminophen (TYLENOL) tablet 650 mg  650 mg Oral Q6H PRN Laveda Abbe, NP      . alum & mag hydroxide-simeth (MAALOX/MYLANTA) 200-200-20 MG/5ML suspension 30 mL  30 mL Oral Q4H PRN Laveda Abbe, NP      . calcium-vitamin D (OSCAL WITH D) 500-200 MG-UNIT per tablet 1 tablet  1 tablet Oral Q breakfast Laveda Abbe, NP   1 tablet at 06/23/17 [phone]  . gabapentin (NEURONTIN) capsule 200 mg  200 mg Oral BID Laveda Abbe, NP   200 mg at 06/21/17 0801  . hydrOXYzine (ATARAX/VISTARIL) tablet 25 mg  25 mg Oral Q8H Laveda Abbe, NP   25 mg at 06/23/17 0631  .  losartan (COZAAR) tablet 100 mg  100 mg Oral Daily Laveda AbbeParks, Laurie Britton, NP   100 mg at 06/23/17 0735  . magnesium hydroxide (MILK OF MAGNESIA) suspension 30 mL  30 mL Oral Daily PRN Laveda AbbeParks, Laurie Britton, NP      . metFORMIN (GLUCOPHAGE) tablet 500 mg  500 mg Oral BID WC Laveda AbbeParks, Laurie Britton, NP   500 mg at 06/23/17 0735  . OLANZapine zydis (ZYPREXA) disintegrating tablet 10 mg  10 mg Oral Q8H PRN Micheal Likensainville, Christopher T, MD       Or  . OLANZapine (ZYPREXA) injection 10 mg  10 mg Intramuscular Q8H PRN Micheal Likens[place], Christopher T, MD   10 mg at  06/12/17 1058  . OLANZapine zydis (ZYPREXA) disintegrating tablet 10 mg  10 mg Oral QHS Micheal Likens[place], Christopher T, MD   10 mg at 06/22/17 2100   Or  . OLANZapine (ZYPREXA) injection 10 mg  10 mg Intramuscular QHS Micheal Likens[place], Christopher T, MD      . traZODone (DESYREL) tablet 50 mg  50 mg Oral QHS PRN Micheal Likensain[place], Christopher T, MD       Lab Results: No results found for this or any previous visit (from the past 48 hour(s)).  Blood Alcohol level:  Lab Results  Component Value Date   ETH <10 05/30/2017   ETH <10 04/11/2017   Metabolic Disorder Labs: No results found for: HGBA1C, MPG No results found for: PROLACTIN No results found for: CHOL, TRIG, HDL, CHOLHDL, VLDL, LDLCALC  Physical Findings: AIMS: Facial and Oral Movements Muscles of Facial Expression: None, normal Lips and Perioral Area: None, normal Jaw: None, normal Tongue: None, normal,Extremity Movements Upper (arms, wrists, hands, fingers): None, normal Lower (legs, knees, ankles, toes): None, normal, Trunk Movements Neck, shoulders, hips: None, normal, Overall Severity Severity of abnormal movements (highest score from questions above): None, normal Incapacitation due to abnormal movements: None, normal Patient's awareness of abnormal movements (rate only patient's report): No Awareness, Dental Status Current problems with teeth and/or dentures?: No Does patient usually wear dentures?: No  CIWA:  CIWA-Ar Total: 1 COWS:     Musculoskeletal: Strength & Muscle Tone: within normal limits Gait & Station: normal Patient leans: N/A  Psychiatric Specialty Exam: Physical Exam  Nursing note and vitals reviewed. Constitutional: She appears well-developed.  Psychiatric: She has a normal mood and affect. Her behavior is normal.    Review of Systems  Psychiatric/Behavioral: Negative for depression, hallucinations and suicidal ideas. The patient is not nervous/anxious.   All other systems reviewed and are negative.   Blood  pressure (!) 175/64, pulse (!) 102, temperature 97.8 F (36.6 C), temperature source Oral, resp. rate 16, height 4' 11.45" (1.51 m), weight 62.1 kg (137 lb), SpO2 97 %.Body mass index is 27.25 kg/m.  General Appearance: Casual paper scrubs   Eye Contact:  Fair  Speech:  Clear and Coherent and Normal Rate  Volume:  Increased  Mood:  Angry, Dysphoric and Irritable  Affect:  Blunt and Congruent  Thought Process:  Coherent, Goal Directed and Descriptions of Associations: Loose  Orientation:  Full (Time, Place, and Person)  Thought Content:  Delusions, Ideas of Reference:   Paranoia Delusions and Paranoid Ideation  Suicidal Thoughts:  No  Homicidal Thoughts:  No  Memory:  Immediate;   Fair Recent;   Fair Remote;   Fair  Judgement:  Poor  Insight:  Lacking  Psychomotor Activity:  Normal  Concentration:  Concentration: Good  Recall:  Fair  Fund of Knowledge:  Fair  Language:  Fair  Akathisia:  No  Handed:    AIMS (if indicated):     Assets:  Resilience Social Support  ADL's:  Intact  Cognition:  WNL  Sleep:  Number of Hours: 6.5   Treatment Plan Summary: Daily contact with patient to assess and evaluate symptoms and progress in treatment and Medication management.   - Will continue today 06/23/2017 plan as below except where it is noted.  - Second opinion for forced medications last renewed on 2/18 by Dr. Jackquline Berlin  -Schizophrenia -Continuezyprexa 5mg  po/IM qAM + 10mg  po/IM qhs (only give IM if pt refuses oral form)  -Anxiety/Agitation -Continuegabapentin 200mg  po BID -Continueatarax 25mg  po q8h prn anxiety -Continueagitation protocol with zydis/ativan/geodon  - HTN -Continuelosartan 100mg  po qDay  - DMII -Continuemetformin 500mg  po BID  - Insomnia -Continuetrazodone 50mg  po qhs prn insomnia  -Encourage participation in groups and the therapeutic milieu -Discharge  planning will be ongoing  Oneta Rack, NP 06/23/2017, 2:04 PM

## 2017-06-23 NOTE — Plan of Care (Signed)
Nurse discussed depression, anxiety, coping skills with patient.  

## 2017-06-23 NOTE — Progress Notes (Signed)
Adult Psychoeducational Group Note  Date:  06/23/2017 Time:  5:01 PM  Group Topic/Focus:  Wellness Toolbox:   The focus of this group is to discuss various aspects of wellness, balancing those aspects and exploring ways to increase the ability to experience wellness.  Patients will create a wellness toolbox for use upon discharge.  Participation Level:  None  Participation Quality:  na  Affect:  na  Cognitive:  na  Insight: None  Engagement in Group:  None  Modes of Intervention:  Activity  Additional Comments: pt did not attend group  Sherri BayleyLeonard B Oleg Oleson 06/23/2017, 5:01 PM

## 2017-06-23 NOTE — Progress Notes (Signed)
Patient ID: Sherri [name], female   DOB: [birth year], 62 y.o.   MRN: [id number]  Patient refuses scheduled CBG despite encouragement and education provided by Clinical research associatewriter.

## 2017-06-23 NOTE — [facility] Group Notes (Signed)
LCSW Group Therapy Note   06/23/2017 1:15pm   Type of Therapy and Topic:  Group Therapy:  Overcoming Obstacles   Participation Level:  Did Not Attend   Description of Group:    In this group patients will be encouraged to explore what they see as obstacles to their own wellness and recovery. They will be guided to discuss their thoughts, feelings, and behaviors related to these obstacles. The group will process together ways to cope with barriers, with attention given to specific choices patients can make. Each patient will be challenged to identify changes they are motivated to make in order to overcome their obstacles. This group will be process-oriented, with patients participating in exploration of their own experiences as well as giving and receiving support and challenge from other group members.   Therapeutic Goals: 1. Patient will identify personal and current obstacles as they relate to admission. 2. Patient will identify barriers that currently interfere with their wellness or overcoming obstacles.  3. Patient will identify feelings, thought process and behaviors related to these barriers. 4. Patient will identify two changes they are willing to make to overcome these obstacles:      Summary of Patient Progress      Therapeutic Modalities:   Cognitive Behavioral Therapy Solution Focused Therapy Motivational Interviewing Relapse Prevention Therapy  Ida RogueRodney B Jacobo Moncrief, LCSW 06/23/2017 3:08 PM

## 2017-06-23 NOTE — Progress Notes (Signed)
Adult Psychoeducational Group Note  Date:  06/23/2017 Time:  8:35 PM  Group Topic/Focus:  Wrap-Up Group:   The focus of this group is to help patients review their daily goal of treatment and discuss progress on daily workbooks.  Participation Level:  Active  Participation Quality:  Appropriate  Affect:  Appropriate  Cognitive:  Appropriate  Insight: Appropriate  Engagement in Group:  Engaged  Modes of Intervention:  Discussion  Additional Comments:    Octavio Mannshigpen, Bryanda Mikel Lee 06/23/2017, 8:35 PM

## 2017-06-23 NOTE — Progress Notes (Signed)
Patient refused CBG and VS to be taken this morning.  Also refused neurotin medication.  Patient stated her brother is going to sue hospital and has already started suit with lawyers.

## 2017-06-23 NOTE — [facility] Counselor (Signed)
Spoke with brother on conference call with NP Tanika.  He gave his permission for us to change anti-psychotic so we could give his sister LAI.  Is also in favor of continuing to contact [facility] to pursue [facility].

## 2017-06-23 NOTE — Progress Notes (Signed)
Patient ID: Sherri [name], female   DOB: [birth year], 62 y.o.   MRN: [id number]  Nursing Progress Note 1900-0730  D) Patient presents with anxious mood and continues to have paranoia and delusional thinking. Patient is isolative to room but did get up for snacks this evening. Patient denies SI/HI/AVH or pain. Patient contracts for safety on the unit. Patient minimal when writer engages with her but is compliant and verbally agrees to take scheduled medications without incident.  A) Patient educated about and provided medication as scheduled or requested per provider's orders. Patient safety maintained with q15 min safety checks. Moderate fall risk precautions in place. Emotional support given. 1:1 interaction and active listening provided. Snacks and fluids provided. Labs, vital signs and patient behavior monitored throughout shift. Patient encouraged to work on treatment plan.  R) Patient remains safe on the unit at this time. Patient agrees to make needs known to staff. Will continue to monitor and assess for changes.

## 2017-06-23 NOTE — Progress Notes (Signed)
Patient has threatened to sue hospital.  That her brother already has names that he will sue that work in this hospital.   Patient has refused neurotin medication today.  Patient has stayed in her room most of the day and has not attended daily groups. When patient was asked about SI, HI, A/V hallucinations, patient walked away from nurse and returned to her room. Safety maintained with 15 minute checks. Emotional support and encouragement given patient.

## 2017-06-24 NOTE — Progress Notes (Addendum)
D:  Patient's self inventory sheet, patient has poor sleep, sleep medication not helpful.  Good appetite, low energy level, poor concentration.  Rated depression, hopeless and anxiety 10.  Denied withdrawals, checked agitation, nausea, irritability, dizziness.  Denied SI.  Physical problems, lightheaded, dizziness, headaches, blurred vision.  Denied physical pain.  Goal meds, groups, [facility]  No discharge plans. A:  Patient continues to refuse neurotin.  Emotional support and encouragement given patient. R:  Safety maintained with 15 minute checks.  Patient walking 500 hallway this morning.  Patient walks away when asked questions by staff.

## 2017-06-24 NOTE — Progress Notes (Signed)
Patient refused scheduled CBG despite encouragement from staff.

## 2017-06-24 NOTE — Plan of Care (Signed)
Nurse attempted to discuss anxiety and coping skills with patient.

## 2017-06-24 NOTE — Progress Notes (Signed)
[facility] MD Progress Note  06/24/2017 4:22 PM Sherri [name]  MRN:  [id number] Subjective:   Sherri [name] is a 62 y/o F with history of schizophrenia who was admitted with worening psychosis and paranoia. Pt has a guardian whom is her brother. She has received a second opinion for forced medications since her admission here, but she has recently been accepting oral form of zyprexa. She remains paranoid, guarded, and uncooperative with the treatment team. SW team has been exploring option of transfer to [facility] inpatient psychiatry unit.  Today upon evaluation, pt refuses to meet with this provider and instead dismissed me in the hallway, stating, "I see you Yehuda Budd, and I know about your cousin." Attempted to ask patient about basic SI/HI/AH/VH, and she denied those symptoms as well as any physical complaints, but she refused to participate further in the interview. Pt will be continued on her current regimen without changes. We will reach out to her guardian about possibility of initializing long-acting injectable form of antipsychotic.  Principal Problem: Schizophrenia, disorganized, chronic (HCC) Diagnosis:   Patient Active Problem List   Diagnosis Date Noted  . 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 Schizoaffective disorder, bipolar type (HCC) [F25.0] 05/31/2017  . 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 Bipolar 1 disorder (HCC)   . Hypertension   . Prediabetes   . Schizophrenia (HCC)   . Tachycardia     Past Surgical History:  Procedure Laterality Date  . COLON SURGERY     Family History: History reviewed. No pertinent family history. Family Psychiatric  History: see H&P Social History:  Social History   Substance and Sexual Activity  Alcohol Use No  . Frequency:  Never     Social History   Substance and Sexual Activity  Drug Use No    Social History   Socioeconomic History  . Marital status: Divorced    Spouse name: None  . Number of children: None  . Years of education: None  . Highest education level: None  Social Needs  . Financial resource strain: None  . Food insecurity - worry: None  . Food insecurity - inability: None  . Transportation needs - medical: None  . Transportation needs - non-medical: None  Occupational History  . None  Tobacco Use  . Smoking status: Never Smoker  . Smokeless tobacco: Never Used  Substance and Sexual Activity  . Alcohol use: No    Frequency: Never  . Drug use: No  . Sexual activity: None  Other Topics Concern  . None  Social History Narrative  . None   Additional Social History:                         Sleep: Good  Appetite:  Good  Current Medications: Current Facility-Administered Medications  Medication Dose Route Frequency Provider Last Rate Last Dose  . acetaminophen (TYLENOL) tablet 650 mg  650 mg Oral Q6H PRN Laveda Abbe, NP      . alum & mag hydroxide-simeth (MAALOX/MYLANTA) 200-200-20 MG/5ML suspension 30 mL  30 mL Oral Q4H PRN Laveda Abbe, NP      . calcium-vitamin D (OSCAL WITH D) 500-200 MG-UNIT per tablet 1 tablet  1 tablet Oral Q breakfast Laveda Abbe, NP   1 tablet at 06/24/17  [id number]  . gabapentin (NEURONTIN) capsule 200 mg  200 mg Oral BID Laveda AbbeParks, Laurie Britton, NP   200 mg at 06/21/17 0801  . hydrOXYzine (ATARAX/VISTARIL) tablet 25 mg  25 mg Oral Q8H Laveda AbbeParks, Laurie Britton, NP   25 mg at 06/24/17 1500  . losartan (COZAAR) tablet 100 mg  100 mg Oral Daily Laveda AbbeParks, Laurie Britton, NP   100 mg at 06/24/17 0743  . magnesium hydroxide (MILK OF MAGNESIA) suspension 30 mL  30 mL Oral Daily PRN Laveda AbbeParks, Laurie Britton, NP      . metFORMIN (GLUCOPHAGE) tablet 500 mg  500 mg Oral BID WC Laveda AbbeParks, Laurie Britton, NP   500 mg at 06/24/17 1619  .  OLANZapine zydis (ZYPREXA) disintegrating tablet 10 mg  10 mg Oral Q8H PRN Micheal Likensainville, Weslie Pretlow T, MD       Or  . OLANZapine (ZYPREXA) injection 10 mg  10 mg Intramuscular Q8H PRN Micheal Likensainville, Kelcy Laible T, MD   10 mg at 06/12/17 1058  . OLANZapine zydis (ZYPREXA) disintegrating tablet 10 mg  10 mg Oral QHS Micheal Likensainville, Tamanna Whitson T, MD   10 mg at 06/23/17 2100   Or  . OLANZapine (ZYPREXA) injection 10 mg  10 mg Intramuscular QHS Micheal Likens[place]ville, Moishe Schellenberg T, MD      . traZODone (DESYREL) tablet 50 mg  50 mg Oral QHS PRN Micheal Likensainville, Johne Buckle T, MD        Lab Results: No results found for this or any previous visit (from the past 48 hour(s)).  Blood Alcohol level:  Lab Results  Component Value Date   ETH <10 05/30/2017   ETH <10 04/11/2017    Metabolic Disorder Labs: No results found for: HGBA1C, MPG No results found for: PROLACTIN No results found for: CHOL, TRIG, HDL, CHOLHDL, VLDL, LDLCALC  Physical Findings: AIMS: Facial and Oral Movements Muscles of Facial Expression: None, normal Lips and Perioral Area: None, normal Jaw: None, normal Tongue: None, normal,Extremity Movements Upper (arms, wrists, hands, fingers): None, normal Lower (legs, knees, ankles, toes): None, normal, Trunk Movements Neck, shoulders, hips: None, normal, Overall Severity Severity of abnormal movements (highest score from questions above): None, normal Incapacitation due to abnormal movements: None, normal Patient's awareness of abnormal movements (rate only patient's report): No Awareness, Dental Status Current problems with teeth and/or dentures?: No Does patient usually wear dentures?: No  CIWA:  CIWA-Ar Total: 1 COWS:     Musculoskeletal: Strength & Muscle Tone: within normal limits Gait & Station: normal Patient leans: N/A  Psychiatric Specialty Exam: Physical Exam  Nursing note and vitals reviewed.   Review of Systems  Constitutional: Negative for chills and fever.  Gastrointestinal:  Negative for heartburn and nausea.  Psychiatric/Behavioral: Negative for depression, hallucinations and suicidal ideas. The patient is not nervous/anxious.     Blood pressure (!) 175/64, pulse (!) 102, temperature 97.8 F (36.6 C), temperature source Oral, resp. rate 16, height 4' 11.45" (1.51 m), weight 62.1 kg (137 lb), SpO2 97 %.Body mass index is 27.25 kg/m.  General Appearance: Fairly Groomed  Eye Contact:  Good  Speech:  Clear and Coherent and Normal Rate  Volume:  Increased  Mood:  Angry and Dysphoric  Affect:  Blunt  Thought Process:  Disorganized and Descriptions of Associations: Loose  Orientation:  Full (Time, Place, and Person)  Thought Content:  Delusions, Ideas of Reference:   Paranoia Delusions, Obsessions and Paranoid Ideation  Suicidal Thoughts:  No  Homicidal Thoughts:  No  Memory:  Immediate;   Fair Recent;   Fair Remote;  Fair  Judgement:  Poor  Insight:  Lacking  Psychomotor Activity:  Normal  Concentration:  Concentration: Fair  Recall:  [employer] of Knowledge:  Fair  Language:  Fair  Akathisia:  No  Handed:    AIMS (if indicated):     Assets:  Resilience  ADL's:  Intact  Cognition:  WNL  Sleep:  Number of Hours: 6.75     Treatment Plan Summary: Daily contact with patient to assess and evaluate symptoms and progress in treatment and Medication management   - Second opinion for forced medications last renewed on 2/18 by Dr. Jackquline Berlin - Pt now accepting of oral zyprexa at bedtime, but we will consider renewal of second opinion if pt refuses long-acting form of antipsychotic  -Schizophrenia -Continuezyprexa 5mg  po/IM qAM + 10mg  po/IM qhs (only give IM if pt refuses oral form)  -Anxiety/Agitation -Continuegabapentin 200mg  po BID -Continueatarax 25mg  po q8h prn anxiety -Continueagitation protocol with zydis/ativan/geodon  - HTN -Continuelosartan 100mg  po qDay  -  DMII -Continuemetformin 500mg  po BID  - Insomnia -Continuetrazodone 50mg  po qhs prn insomnia  -Encourage participation in groups and the therapeutic milieu  -Discharge planning will be ongoing    Micheal Likens, MD 06/24/2017, 4:22 PM

## 2017-06-24 NOTE — Progress Notes (Signed)
Did not attend. 

## 2017-06-24 NOTE — Progress Notes (Signed)
Nursing Progress Note: 7p-7a D: Pt currently presents with a soft and slow speech/anxious/tangential/paranoid affect and behavior. Pt states "I had an ok day. I want to just stay in bed. I don't want to be around anyone." Not interacting with the milieu. Pt reports fair sleep during the previous night with current medication regimen. Pt did not attend wrap-up group.  A: Pt provided with medications per providers orders. Pt's labs and vitals were monitored throughout the night. Pt supported emotionally and encouraged to express concerns and questions. Pt educated on medications.  R: Pt's safety ensured with 15 minute and environmental checks. Pt currently denies SI, HI, and AVH. Pt verbally contracts to seek staff if SI,HI, or AVH occurs and to consult with staff before acting on any harmful thoughts. Will continue to monitor.

## 2017-06-24 NOTE — Progress Notes (Signed)
Recreation Therapy Notes  Date: 06/24/17 Time: 1000 Location: 500 Hall Dayroom  Group Topic: Coping Skills  Goal Area(s) Addresses:  Patient will be able to identify positive coping skills. Patient will be able to identify benefits of positive coping skills. Patient will identify benefits of using coping skills post d/c.  Intervention: Worksheet, pencil  Activity: Web Design.  Patients will identify things they feel have gotten them "stuck".  Patients will then identify coping skills they can use to help them deal with the situations they have been dealing with.    Education: Coping Skills, Discharge Planning.   Education Outcome: Acknowledges understanding/In group clarification offered/Needs additional education.   Clinical Observations/Feedback: Pt did not attend group.    Sherri [name], [name]         Sherri [name] 06/24/2017 12:01 PM 

## 2017-06-24 NOTE — [facility] Group Notes (Signed)
LCSW Group Therapy Note  06/24/2017 1:15pm    Type of Therapy and Topic:  Group Therapy:  Who Am I?  Self Esteem, Self-Actualization and Understanding Self    Participation Level:  Did Not Attend  Description of Group:    In this group patients will be asked to explore values, beliefs, truths, and morals as they relate to personal self.  Patients will be guided to discuss their thoughts, feelings, and behaviors related to what they identify as important to their true self. Patients will process together how values, beliefs and truths are connected to specific choices patients make every day. Each patient will be challenged to identify changes that they are motivated to make in order to improve self-esteem and self-actualization. This group will be process-oriented, with patients participating in exploration of their own experiences, giving and receiving support, and processing challenge from other group members.   Therapeutic Goals: 1. Patient will identify false beliefs that currently interfere with their self-esteem.  2. Patient will identify feelings, thought process, and behaviors related to self and will become aware of the uniqueness of themselves and of others.  3. Patient will be able to identify and verbalize values, morals, and beliefs as they relate to self. 4. Patient will begin to learn how to build self-esteem/self-awareness by expressing what is important and unique to them personally.   Summary of Patient Progress      Therapeutic Modalities:   Cognitive Behavioral Therapy Solution Focused Therapy Motivational Interviewing Brief Therapy   Ida RogueRodney B Yehoshua Vitelli, LCSW 06/24/2017 1:17 PM

## 2017-06-24 NOTE — Tx Team (Signed)
Interdisciplinary Treatment and Diagnostic Plan Update  06/24/2017 Time of Session: 1:18 PM  Sherri [name] MRN: [id number]  Principal Diagnosis: Schizophrenia, disorganized, chronic (HCC)  Secondary Diagnoses: Principal Problem:   Schizophrenia, disorganized, chronic (HCC)   Current Medications:  Current Facility-Administered Medications  Medication Dose Route Frequency Provider Last Rate Last Dose  . acetaminophen (TYLENOL) tablet 650 mg  650 mg Oral Q6H PRN Laveda Abbe, NP      . alum & mag hydroxide-simeth (MAALOX/MYLANTA) 200-200-20 MG/5ML suspension 30 mL  30 mL Oral Q4H PRN Laveda Abbe, NP      . calcium-vitamin D (OSCAL WITH D) 500-200 MG-UNIT per tablet 1 tablet  1 tablet Oral Q breakfast Laveda Abbe, NP   1 tablet at 06/24/17 [phone]  . gabapentin (NEURONTIN) capsule 200 mg  200 mg Oral BID Laveda Abbe, NP   200 mg at 06/21/17 0801  . hydrOXYzine (ATARAX/VISTARIL) tablet 25 mg  25 mg Oral Q8H Laveda Abbe, NP   25 mg at 06/24/17 1191  . losartan (COZAAR) tablet 100 mg  100 mg Oral Daily Laveda Abbe, NP   100 mg at 06/24/17 0743  . magnesium hydroxide (MILK OF MAGNESIA) suspension 30 mL  30 mL Oral Daily PRN Laveda Abbe, NP      . metFORMIN (GLUCOPHAGE) tablet 500 mg  500 mg Oral BID WC Laveda Abbe, NP   500 mg at 06/24/17 0743  . OLANZapine zydis (ZYPREXA) disintegrating tablet 10 mg  10 mg Oral Q8H PRN Micheal Likens, MD       Or  . OLANZapine (ZYPREXA) injection 10 mg  10 mg Intramuscular Q8H PRN Micheal Likens, MD   10 mg at 06/12/17 1058  . OLANZapine zydis (ZYPREXA) disintegrating tablet 10 mg  10 mg Oral QHS Micheal Likens, MD   10 mg at 06/23/17 2100   Or  . OLANZapine (ZYPREXA) injection 10 mg  10 mg Intramuscular QHS Micheal Likens, MD      . traZODone (DESYREL) tablet 50 mg  50 mg Oral QHS PRN Micheal Likens, MD        PTA  Medications: Medications Prior to Admission  Medication Sig Dispense Refill Last Dose  . acetaminophen (TYLENOL) 500 MG tablet Take 1,000 mg by mouth every 8 (eight) hours as needed for mild pain.   unk  . calcium-vitamin D (OSCAL WITH D) 500-200 MG-UNIT tablet Take 1 tablet by mouth daily with breakfast.   Past Week at Unknown time  . chlorhexidine (PERIDEX) 0.12 % solution Use as directed 15 mLs in the mouth or throat 2 (two) times daily.   Past Week at Unknown time  . cholecalciferol (VITAMIN D) 1000 units tablet Take 2,000 Units by mouth daily.   Past Week at Unknown time  . Cinnamon 500 MG capsule Take 500 mg by mouth daily.   Past Week at Unknown time  . Cranberry (CRANBERRY CONCENTRATE) 500 MG CAPS Take 1 capsule by mouth daily.   Past Week at Unknown time  . ferrous sulfate 325 (65 FE) MG tablet Take 325 mg by mouth daily with breakfast.   Past Week at Unknown time  . guaifenesin (HUMIBID E) 400 MG TABS tablet Take 400 mg by mouth every 8 (eight) hours as needed (mucus relief).   unk  . guaiFENesin-dextromethorphan (ROBITUSSIN DM) 100-10 MG/5ML syrup Take 10 mLs by mouth every 4 (four) hours as needed for cough.   unk  . hydrOXYzine (ATARAX/VISTARIL)  25 MG tablet Take 1 tablet (25 mg total) by mouth every 6 (six) hours as needed for anxiety. (Patient taking differently: Take 25 mg by mouth every 8 (eight) hours. ) 30 tablet 0 Past Week at Unknown time  . loratadine (CLARITIN) 10 MG tablet Take 10 mg by mouth daily as needed for allergies.   unk at Unknown time  . losartan (COZAAR) 100 MG tablet Take 100 mg by mouth daily.   Past Week at Unknown time  . metFORMIN (GLUCOPHAGE) 500 MG tablet Take 500 mg by mouth 2 (two) times daily with a meal.   Past Week at Unknown time  . Multiple Vitamin (MULTIVITAMIN WITH MINERALS) TABS tablet Take 1 tablet by mouth daily.   Past Week at Unknown time  . risperiDONE (RISPERDAL) 0.5 MG tablet Take 1 tablet (0.5 mg total) by mouth 2 (two) times daily. 60  tablet 0 Past Week at Unknown time    Treatment Modalities: Medication Management, Group therapy, Case management,  1 to 1 session with clinician, Psychoeducation, Recreational therapy.  Patient Stressors: Medication change or noncompliance Traumatic event  Patient Strengths: Manufacturing systems engineerCommunication skills Supportive family/friends   Physician Treatment Plan for Primary Diagnosis: Schizophrenia, disorganized, chronic (HCC) Long Term Goal(s): Improvement in symptoms so as ready for discharge  Short Term Goals: Ability to identify and develop effective coping behaviors will improve Compliance with prescribed medications will improve Ability to identify and develop effective coping behaviors will improve  Medication Management: Evaluate patient's response, side effects, and tolerance of medication regimen.  Therapeutic Interventions: 1 to 1 sessions, Unit Group sessions and Medication administration.  Evaluation of Outcomes: Progressing  Physician Treatment Plan for Secondary Diagnosis: Principal Problem:   Schizophrenia, disorganized, chronic (HCC)  Long Term Goal(s): Improvement in symptoms so as ready for discharge  Short Term Goals: Ability to identify and develop effective coping behaviors will improve Compliance with prescribed medications will improve Ability to identify and develop effective coping behaviors will improve  Medication Management: Evaluate patient's response, side effects, and tolerance of medication regimen.  Therapeutic Interventions: 1 to 1 sessions, Unit Group sessions and Medication administration.  Evaluation of Outcomes: Progressing   2/13: Pt remains paranoid, delusional, agitated and aggressive at time. She has poor insight. She has been refusing medications. We will change from Invega to zyprexa (PO or IM if pt refuses PO).   -DiscontinueInvega 6mg  po qDay             - Start zyprexa 5mg  po/IM qAM + 10mg  po/IM qhs (only give IM if pt  refuses oral form)  -Anxiety/Agitation -Continuegabapentin 200mg  po BID -Continueatarax 25mg  po q8h prn anxiety -Continueagitation protocol with zydis/ativan/geodon  - HTN -Continuelosartan 100mg  po qDay  - DMII -Continuemetformin 500mg  po BID  2/21: Pt remains delusional, pressured, labile, and paranoid. She is unwilling to engage with treatment team. We will continue her current medication regimen and continue to investigate possibility of transfer [facility]. [facility].  - Second opinion for forced medications last renewed on 2/18 by Dr. Jackquline BerlinIzediuno  -Schizophrenia -Continuezyprexa 5mg  po/IM qAM + 10mg  po/IM qhs (only give IM if pt refuses oral form)  -Anxiety/Agitation -Continuegabapentin 200mg  po BID -Continueatarax 25mg  po q8h prn anxiety -Continueagitation protocol with zydis/ativan/geodon  - HTN -Continuelosartan 100mg  po qDay  - DMII -Continuemetformin 500mg  po BID  2/26: Second opinion for forced medications last renewed on 2/18 by Dr. Jackquline BerlinIzediuno - Pt now accepting of oral zyprexa at bedtime,  but we will consider renewal of second opinion if pt refuses long-acting form of antipsychotic  -Schizophrenia -Continuezyprexa 5mg  po/IM qAM + 10mg  po/IM qhs (only give IM if pt refuses oral form)  -Anxiety/Agitation -Continuegabapentin 200mg  po BID -Continueatarax 25mg  po q8h prn anxiety -Continueagitation protocol with zydis/ativan/geodon  - HTN -Continuelosartan 100mg  po qDay  - DMII -Continuemetformin 500mg  po BID  - Insomnia -Continuetrazodone 50mg  po qhs prn insomnia        RN Treatment Plan for Primary Diagnosis: Schizophrenia, disorganized,  chronic (HCC) Long Term Goal(s): Knowledge of disease and therapeutic regimen to maintain health will improve  Short Term Goals: Ability to participate in decision making will improve, Ability to identify and develop effective coping behaviors will improve and Compliance with prescribed medications will improve  Medication Management: RN will administer medications as ordered by provider, will assess and evaluate patient's response and provide education to patient for prescribed medication. RN will report any adverse and/or side effects to prescribing provider.  Therapeutic Interventions: 1 on 1 counseling sessions, Psychoeducation, Medication administration, Evaluate responses to treatment, Monitor vital signs and CBGs as ordered, Perform/monitor CIWA, COWS, AIMS and Fall Risk screenings as ordered, Perform wound care treatments as ordered.  Evaluation of Outcomes: Progressing   LCSW Treatment Plan for Primary Diagnosis: Schizophrenia, disorganized, chronic (HCC) Long Term Goal(s): Safe transition to appropriate next level of care at discharge, Engage patient in therapeutic group addressing interpersonal concerns.  Short Term Goals: Engage patient in aftercare planning with referrals and resources, Facilitate acceptance of mental health diagnosis and concerns, Identify triggers associated with mental health/substance abuse issues and Increase skills for wellness and recovery  Therapeutic Interventions: Assess for all discharge needs, 1 to 1 time with Social worker, Explore available resources and support systems, Assess for adequacy in community support network, Educate family and significant other(s) on suicide prevention, Complete Psychosocial Assessment, Interpersonal group therapy.  Evaluation of Outcomes: Progressing   Progress in Treatment: Attending groups: No Participating in groups: No Taking medication as prescribed: Yes Toleration of medication: Yes, no side effects reported at  this time Family/Significant other contact made: Sherri [name] [phone] (Brother/Legal Guardian) Patient understands diagnosis: No, limited insight Discussing patient identified problems/goals with staff: Yes Medical problems stabilized or resolved: Yes Denies suicidal/homicidal ideation: Yes Issues/concerns per patient self-inventory: None Other: N/A  New problem(s) identified: None identified at this time.   New Short Term/Long Term Goal(s): "I'm not supposed to be here.  I am supposed to be evaluated for a medication adjustment at the [place]".   Discharge Plan or Barriers: Upon discharge pt will return to [facility] and will follow up at [facility] and with the [place].   Reason for Continuation of Hospitalization: Delusions  Hallucinations Medication stabilization   Estimated Length of Stay: 06/27/17  Attendees: Patient:  06/24/2017  1:18 PM  Physician: Nelly Rout, MD 06/24/2017  1:18 PM  Nursing: Estella Husk, RN 06/24/2017  1:18 PM  RN Care Manager: Onnie Boer, RN 06/24/2017  1:18 PM  Social Worker: Richelle Ito, LCSW; Melba Coon, Social Work Intern 06/24/2017  1:18 PM  Recreational Therapist: [name], LRT 06/24/2017  1:18 PM  Other: Tomasita Morrow, P4CC 06/24/2017  1:18 PM  Other:  06/24/2017  1:18 PM  Other: 06/24/2017  1:18 PM    Scribe for Treatment Team: Ida Rogue, LCSW 06/24/2017 1:18 PM

## 2017-06-25 MED ORDER — PALIPERIDONE PALMITATE 234 MG/1.5ML IM SUSP
234.0000 mg | Freq: Once | INTRAMUSCULAR | Status: DC
  Filled 2017-06-25: qty 1.5

## 2017-06-25 MED ORDER — PALIPERIDONE PALMITATE 156 MG/ML IM SUSP: 156.0000 mg | INTRAMUSCULAR | Status: DC

## 2017-06-25 NOTE — BHH Group Notes (Signed)
LCSW Group Therapy Note  06/25/2017 1:15pm  Type of Therapy/Topic:  Group Therapy:  Balance in Life  Participation Level:  Did Not Attend  Description of Group:    This group will address the concept of balance and how it feels and looks when one is unbalanced. Patients will be encouraged to process areas in their lives that are out of balance and identify reasons for remaining unbalanced. Facilitators will guide patients in utilizing problem-solving interventions to address and correct the stressor making their life unbalanced. Understanding and applying boundaries will be explored and addressed for obtaining and maintaining a balanced life. Patients will be encouraged to explore ways to assertively make their unbalanced needs known to significant others in their lives, using other group members and facilitator for support and feedback.  Therapeutic Goals: 1. Patient will identify two or more emotions or situations they have that consume much of in their lives. 2. Patient will identify signs/triggers that life has become out of balance:  3. Patient will identify two ways to set boundaries in order to achieve balance in their lives:  4. Patient will demonstrate ability to communicate their needs through discussion and/or role plays  Summary of Patient Progress:      Therapeutic Modalities:   Cognitive Behavioral Therapy Solution-Focused Therapy Assertiveness Training  Aram Beechamngel M Jadesola Poynter, Student-Social Work 06/25/2017 1:30 PM

## 2017-06-25 NOTE — Progress Notes (Addendum)
Nursing Progress Note: 7p-7a D: Pt currently presents with a anxious/depressed/isolative/cooperative/brightens on approach affect and behavior. Pt states "I just don't feel like being around anyone. No one understands me. No one cares." Not interacting with the milieu. Pt reports good sleep during the previous night with current medication regimen. Pt did not attend wrap-up group.  A: Pt was compliant and was provided with medications per providers orders. Pt's labs and vitals were monitored throughout the night. Pt supported emotionally and encouraged to express concerns and questions. Pt educated on medications.  R: Pt's safety ensured with 15 minute and environmental checks. Pt currently denies SI, HI, and AVH. Pt verbally contracts to seek staff if SI,HI, or AVH occurs and to consult with staff before acting on any harmful thoughts. Will continue to monitor.

## 2017-06-25 NOTE — Progress Notes (Signed)
[facility] MD Progress Note  06/25/2017 3:49 PM Sherri [name]  MRN:  [id number] Subjective:     Sherri [name] is a 62 y/o F with history of schizophrenia who was admitted with worening psychosis and paranoia. Pt has a guardian whom is her brother. She has received a second opinion for forced medications since her admission here, but she has recently been accepting oral form of zyprexa. She remains paranoid, guarded, and uncooperative with the treatment team. SW team has been exploring option of transfer to [facility] inpatient psychiatry unit.  As per RN report, pt refused AM zyprexa today and then threw her medication into another patient's room.  Today upon evaluation, pt was evaluated with female RN staff present due to pt anxiety with males. She was interviewed from her doorway, as she refused to allow this provider to enter her room. Pt is pressured, irritated, and constantly interrupts this provider during interview. She denies any physical complaints, and she denies SI/HI/AH/VH, but she is unwilling to answer and further questions. She states, "Perini, I know about you - the specific problem is I have been here 35 days, now you better release me this afternoon." Attempted to explain to pt that she is under IVC and her brother (guardian) is in agreement with her admission at this time; however, pt refuted this information despite reassurances from RN staff. Offered to patient to contact her brother via conference call with other staff present, and she then got up from her bed and slammed her door in the face of this provider.  As pt's second opinion for forced medication has expired, we will request Dr. Jackquline Berlin to re-evaluate pt for forced medications. As she has tolerated Invega oral form without side effects prior to changing to zyprexa, we will plan to administer Gean [place]ch[place] 234mg  IM (after completion of second opinion for forced medications). SW team continues to explore option of  transfer to [place] inpatient unit, but at this time there is no availability and acuity of their unit and patient's acuity is inappropriate for transfer at this point.  Principal Problem: Schizophrenia, disorganized, chronic (HCC) Diagnosis:   Patient Active Problem List   Diagnosis Date Noted  . 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 Schizoaffective disorder, bipolar type (HCC) [F25.0] 05/31/2017  . 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 Bipolar 1 disorder (HCC)   . Hypertension   . Prediabetes   . Schizophrenia (HCC)   . Tachycardia     Past Surgical History:  Procedure Laterality Date  . COLON SURGERY     Family History: History reviewed. No pertinent family history. Family Psychiatric  History: see H&P Social History:  Social History   Substance and Sexual Activity  Alcohol Use No  . Frequency: Never     Social History   Substance and Sexual Activity  Drug Use No    Social History   Socioeconomic History  . Marital status: Divorced    Spouse name: None  . Number of children: None  . Years of education: None  . Highest education level: None  Social Needs  . Financial resource strain: None  . Food insecurity - worry: None  . Food insecurity - inability: None  . Transportation needs - medical: None  . Transportation needs - non-medical: None  Occupational History  . None  Tobacco Use  . Smoking status: Never Smoker  . Smokeless tobacco: Never Used  Substance and Sexual Activity  . Alcohol use: No    Frequency: Never  . Drug use: No  . Sexual activity: None  Other Topics Concern  . None  Social History Narrative  . None   Additional Social History:                         Sleep: Fair  Appetite:  Fair  Current Medications: Current  Facility-Administered Medications  Medication Dose Route Frequency Provider Last Rate Last Dose  . acetaminophen (TYLENOL) tablet 650 mg  650 mg Oral Q6H PRN Laveda AbbeParks, Laurie Britton, NP      . alum & mag hydroxide-simeth (MAALOX/MYLANTA) 200-200-20 MG/5ML suspension 30 mL  30 mL Oral Q4H PRN Laveda AbbeParks, Laurie Britton, NP      . calcium-vitamin D (OSCAL WITH D) 500-200 MG-UNIT per tablet 1 tablet  1 tablet Oral Q breakfast Laveda AbbeParks, Laurie Britton, NP   1 tablet at 06/25/17 [phone]  . gabapentin (NEURONTIN) capsule 200 mg  200 mg Oral BID Laveda AbbeParks, Laurie Britton, NP   200 mg at 06/21/17 0801  . hydrOXYzine (ATARAX/VISTARIL) tablet 25 mg  25 mg Oral Q8H Laveda AbbeParks, Laurie Britton, NP   25 mg at 06/25/17 1305  . losartan (COZAAR) tablet 100 mg  100 mg Oral Daily Laveda AbbeParks, Laurie Britton, NP   100 mg at 06/25/17 [id number]  . magnesium hydroxide (MILK OF MAGNESIA) suspension 30 mL  30 mL Oral Daily PRN Laveda AbbeParks, Laurie Britton, NP      . metFORMIN (GLUCOPHAGE) tablet 500 mg  500 mg Oral BID WC Laveda AbbeParks, Laurie Britton, NP   500 mg at 06/25/17 [id number]  . OLANZapine zydis (ZYPREXA) disintegrating tablet 10 mg  10 mg Oral Q8H PRN Micheal Likensain[place], Nicholle Falzon T, MD       Or  . OLANZapine (ZYPREXA) injection 10 mg  10 mg Intramuscular Q8H PRN Micheal Likens[place], Tyheem Boughner T, MD   10 mg at 06/12/17 1058  . OLANZapine zydis (ZYPREXA) disintegrating tablet 10 mg  10 mg Oral QHS Micheal Likens[place], Janisha Bueso T, MD   10 mg at 06/24/17 2031   Or  . OLANZapine (ZYPREXA) injection 10 mg  10 mg Intramuscular QHS Micheal Likens[place], Maddelyn Rocca T, MD      . traZODone (DESYREL) tablet 50 mg  50 mg Oral QHS PRN Micheal Likens[place], Laurabelle Gorczyca T, MD        Lab Results: No results found for this or any previous visit (from the past 48 hour(s)).  Blood Alcohol level:  Lab Results  Component Value Date   ETH <10 05/30/2017   ETH <10 04/11/2017    Metabolic Disorder Labs: No results found for: HGBA1C, MPG No results found for: PROLACTIN No results found for: CHOL, TRIG, HDL, CHOLHDL,  VLDL, LDLCALC  Physical Findings: AIMS: Facial and Oral Movements Muscles of Facial Expression: None, normal Lips and Perioral Area: None, normal Jaw: None, normal Tongue: None, normal,Extremity Movements Upper (arms, wrists, hands, fingers): None, normal Lower (legs, knees, ankles, toes): None, normal, Trunk Movements Neck, shoulders, hips: None, normal, Overall Severity Severity of abnormal movements (highest score from questions above): None, normal Incapacitation due to abnormal movements: None, normal Patient's awareness of abnormal movements (rate only patient's report): No Awareness, Dental Status Current problems with teeth and/or dentures?: No Does patient usually wear dentures?: No  CIWA:  CIWA-Ar Total: 1 COWS:     Musculoskeletal: Strength & Muscle Tone: within normal limits Gait &  Station: normal Patient leans: N/A  Psychiatric Specialty Exam: Physical Exam  Nursing note and vitals reviewed.   Review of Systems  Constitutional: Negative for chills and fever.  Musculoskeletal: Negative for joint pain and myalgias.  Psychiatric/Behavioral: Negative for depression, hallucinations and suicidal ideas.    Blood pressure (!) 175/64, pulse (!) 102, temperature 97.8 F (36.6 C), temperature source Oral, resp. rate 16, height 4' 11.45" (1.51 m), weight 62.1 kg (137 lb), SpO2 97 %.Body mass index is 27.25 kg/m.  General Appearance: Casual and Fairly Groomed  Eye Contact:  Good  Speech:  Clear and Coherent  Volume:  Increased  Mood:  Angry and Dysphoric  Affect:  Blunt, Congruent and Labile  Thought Process:  Disorganized, Goal Directed and Descriptions of Associations: Loose  Orientation:  Full (Time, Place, and Person)  Thought Content:  Illogical, Delusions, Ideas of Reference:   Paranoia Delusions, Obsessions and Paranoid Ideation  Suicidal Thoughts:  No  Homicidal Thoughts:  No  Memory:  Immediate;   Fair Recent;   Fair Remote;   Fair  Judgement:  Poor   Insight:  Lacking  Psychomotor Activity:  Normal  Concentration:  Concentration: Poor  Recall:  [employer] of Knowledge:  Fair  Language:  Fair  Akathisia:  No  Handed:    AIMS (if indicated):     Assets:  Resilience  ADL's:  Intact  Cognition:  WNL  Sleep:  Number of Hours: 6.5   Treatment Plan Summary: Daily contact with patient to assess and evaluate symptoms and progress in treatment and Medication management   - Second opinion for forced medications last renewed on 2/18 but has expired -  Pt has been refusing oral zyprexa. We will request for renewal of second opinion for forced medications.   -Schizophrenia - Discontinuezyprexa 5mg  po qAM + 10mg  po qhs   - Start [place] 234mg  IM once (and we will plan to administer booster of [place] 156mg  IM q30days starting in 7 days on 07/02/17).   -Anxiety/Agitation -Continuegabapentin 200mg  po BID -Continueatarax 25mg  po q8h prn anxiety -Continueagitation protocol with zydis/ativan/geodon  - HTN -Continuelosartan 100mg  po qDay  - DMII -Continuemetformin 500mg  po BID  - Insomnia -Continuetrazodone 50mg  po qhs prn insomnia  -Encourage participation in groups and the therapeutic milieu  -Discharge planning will be ongoing  Micheal Likens, MD 06/25/2017, 3:49 PM

## 2017-06-25 NOTE — BHH Group Notes (Signed)
B[facility] Group Notes:  (Nursing/MHT/Case Management/Adjunct)  Date:  06/25/2017  Time:  6:28 PM     Type of Therapy:  Psychoeducational Skills  Participation Level:  Did Not Attend  Participation Quality:  DID NOT ATTEND  Affect:  DID NOT ATTEND  Cognitive:  DID NOT ATTEND  Insight:  None  Engagement in Group:  DID NOT ATTEND  Modes of Intervention:  DID NOT ATTEND  Summary of Progress/Problems: Pt did not attend patient self inventory group.

## 2017-06-25 NOTE — Progress Notes (Signed)
Psychoeducational Group Note  Date:  06/25/2017 Time:  2050  Group Topic/Focus:  Wrap-Up Group:   The focus of this group is to help patients review their daily goal of treatment and discuss progress on daily workbooks.  Participation Level: Did Not Attend  Participation Quality:  Not Applicable  Affect:  Not Applicable  Cognitive:  Not Applicable  Insight:  Not Applicable  Engagement in Group: Not Applicable  Additional Comments:  The patient did not attend group this evening.   Hazle CocaGOODMAN, Missi Mcmackin S 06/25/2017, 8:51 PM

## 2017-06-25 NOTE — Progress Notes (Signed)
DAR NOTE: Patient presents with anxious affect and depressed mood. Pt irritable this morning, took her medication and went and threw in another pt's room plus the water. Pt stated she is being forced to take medication and she has a right to refuse. Pt has been staying in her room most.  Denies pain, auditory and visual hallucinations.  Rates depression at 0, hopelessness at 0, and anxiety at 0.  Maintained on routine safety checks.  Support and encouragement offered as needed. States goal for today is "returned to my place of residence."  Will continue continue to monitor.

## 2017-06-25 NOTE — Progress Notes (Signed)
[facility] Progress Notefor Medication Administration to Non-consenting Patients (For Involuntarily Committed Patients)  Patient:[name], Sherri FriedMichelle S Date of Birth:[birth year] ZOX:[id number]  Reason for the Medication:The patient, without the benefit of the specific treatment measure, is incapable of participating in any available treatment plan that will give the patient a realistic opportunity of improving the patient's condition.  Consideration of Side Effects:Consideration of the side effects related to the medication plan has been given.  Rationale for Medication Administration:61 y.o female with history of schizophrenia. She presented with disorganized and paranoid symptoms. Patient remains uncooperative with care. No insight into her illness. Her legal guardian is in agreement with forced medication order. Teams goal is to have forced out patient commitment eventually. I agree with forced medication order as she is uncooperative and can potentially put herself at risk.

## 2017-06-25 NOTE — Progress Notes (Signed)
Recreation Therapy Notes  Date: 06/25/17 Time: 1000 Location: 500 Hall Dayroom  Group Topic: Anger Management  Goal Area(s) Addresses:  Patient will identify triggers for anger.  Patient will identify physical reaction to anger.   Patient will identify benefit of using coping skills when angry.  Intervention: Worksheet, pencils  Activity: Introduction to Stress Management.  Patients were to identify the situations/topics/people that lead to anger; how they act when they are angry and problems they have run into because of their anger.    Education: Anger Management, Discharge Planning   Education Outcome: Acknowledges education/In group clarification offered/Needs additional education.   Clinical Observations/Feedback:  Pt did not attend group.    Tyquisha Sharps, [name]          Iliyana Convey A 06/25/2017 12:52 PM 

## 2017-06-25 NOTE — B[facility] Counselor (Signed)
Reached out to Sherri [name] at [facility] yesterday and was instructed to send over nurse and Dr notes since last review.  Got a message this AM this AM from Sherri [name] saying pt was again rejected due to "acuity."

## 2017-06-26 MED ORDER — HALOPERIDOL 5 MG PO TABS
5.0000 mg | ORAL_TABLET | Freq: Two times a day (BID) | ORAL | Status: DC
  Administered 2017-06-26 – 2017-07-01 (×10): 5 mg via ORAL
  Filled 2017-06-26 (×16): qty 1

## 2017-06-26 MED ORDER — HALOPERIDOL 5 MG PO TABS: 5.0000 mg | ORAL_TABLET | Freq: Four times a day (QID) | ORAL | Status: DC | PRN

## 2017-06-26 MED ORDER — HALOPERIDOL LACTATE 5 MG/ML IJ SOLN
5.0000 mg | Freq: Two times a day (BID) | INTRAMUSCULAR | Status: DC
  Filled 2017-06-26 (×16): qty 1

## 2017-06-26 MED ORDER — HALOPERIDOL LACTATE 5 MG/ML IJ SOLN: 5.0000 mg | Freq: Four times a day (QID) | INTRAMUSCULAR | Status: DC | PRN

## 2017-06-26 NOTE — Progress Notes (Addendum)
[facility]  06/26/2017 11:22 AM Sherri [name]  MRN:  [id number] Subjective:   Sherri [name] is a 62 y/o F with history of schizophrenia who was admitted with worening psychosis and paranoia. Pt has a guardian whom is her brother. She has received a second opinion for forced medications which has been renewed multiple times during her admission - most recently on 06/25/17.  She remains paranoid, guarded, and uncooperative with the treatment team. SW team has been exploring option of [facility], but that is an unavailable option at this time. As of yesterday, pt began to refuse oral antipsychotic medications.  Today upon evaluation, pt was interviewed starting in her room with female RN staff present for pt comfort. Upon start of interview, pt is pressured, irritable, and uncooperative. She interrupts this provider repeatedly and she is unwilling to answer any questions. She demands discharge. She describes how this provider is actually a CNA named "Perini" and I have been working with others to falsely keep her in the hospital, and her brother is in the active process of suing the hospital. Attempted to discuss with patient that the treatment team has been in contact with her brother (guardian) and we would like to schedule a phone conference so that we can all discuss the treatment plan together. Pt then walked away from the interview and forcefully shut her door in front of this provider.   Yesterday, second opinion for forced medications was renewed by Dr. Jackquline BerlinIzediuno. Pt had been refusing oral form of zyprexa, and plan was to transition to long-acting form of [place] Sustenna due to ongoing difficulty with adherence. However, as pt has did not respond well to either invega oral form (she only was accepting of this medication for a few days) or zyprexa (via PO or IM), we will consider trial of haldol due to availability of long-acting injectable form.     Principal Problem: Schizophrenia, disorganized, chronic (HCC) Diagnosis:   Patient Active Problem List   Diagnosis Date Noted  . 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 Schizoaffective disorder, bipolar type (HCC) [F25.0] 05/31/2017  . 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 Bipolar 1 disorder (HCC)   . Hypertension   . Prediabetes   . Schizophrenia (HCC)   . Tachycardia     Past Surgical History:  Procedure Laterality Date  . COLON SURGERY     Family History: History reviewed. No pertinent family history. Family Psychiatric  History: see H&P Social History:  Social History   Substance and Sexual Activity  Alcohol Use No  . Frequency: Never     Social History   Substance and Sexual Activity  Drug Use No    Social History   Socioeconomic History  . Marital status: Divorced    Spouse name: None  . Number of children: None  . Years of education: None  . Highest education level: None  Social Needs  . Financial resource strain: None  . Food insecurity - worry: None  . Food insecurity - inability: None  . Transportation needs - medical: None  . Transportation needs - non-medical: None  Occupational History  . None  Tobacco Use  . Smoking status: Never Smoker  . Smokeless tobacco: Never Used  Substance and Sexual Activity  . Alcohol use: No  Frequency: Never  . Drug use: No  . Sexual activity: None  Other Topics Concern  . None  Social History Narrative  . None   Additional Social History:                         Sleep: Good  Appetite:  Good  Current Medications: Current Facility-Administered Medications  Medication Dose Route Frequency Provider Last Rate Last Dose  . acetaminophen (TYLENOL) tablet 650 mg  650 mg Oral Q6H PRN Laveda Abbe, NP      . alum & mag hydroxide-simeth (MAALOX/MYLANTA) 200-200-20 MG/5ML suspension 30 mL  30 mL Oral Q4H PRN Laveda Abbe, NP      . calcium-vitamin D (OSCAL WITH D) 500-200 MG-UNIT per tablet 1 tablet  1 tablet Oral Q breakfast Laveda Abbe, NP   1 tablet at 06/26/17 8119  . haloperidol (HALDOL) tablet 5 mg  5 mg Oral BID Micheal Likens, MD       Or  . haloperidol lactate (HALDOL) injection 5 mg  5 mg Intramuscular BID Jolyne Loa T, MD      . haloperidol (HALDOL) tablet 5 mg  5 mg Oral Q6H PRN Micheal Likens, MD       Or  . haloperidol lactate (HALDOL) injection 5 mg  5 mg Intramuscular Q6H PRN Micheal Likens, MD      . hydrOXYzine (ATARAX/VISTARIL) tablet 25 mg  25 mg Oral Q8H Laveda Abbe, NP   25 mg at 06/25/17 2019  . losartan (COZAAR) tablet 100 mg  100 mg Oral Daily Laveda Abbe, NP   100 mg at 06/26/17 1478  . magnesium hydroxide (MILK OF MAGNESIA) suspension 30 mL  30 mL Oral Daily PRN Laveda Abbe, NP      . metFORMIN (GLUCOPHAGE) tablet 500 mg  500 mg Oral BID WC Laveda Abbe, NP   500 mg at 06/26/17 0818  . traZODone (DESYREL) tablet 50 mg  50 mg Oral QHS PRN Micheal Likens, MD        Lab Results: No results found for this or any previous visit (from the past 48 hour(s)).  Blood Alcohol level:  Lab Results  Component Value Date   ETH <10 05/30/2017   ETH <10 04/11/2017    Metabolic Disorder Labs: No results found for: HGBA1C, MPG No results found for: PROLACTIN No results found for: CHOL, TRIG, HDL, CHOLHDL, VLDL, LDLCALC  Physical Findings: AIMS: Facial and Oral Movements Muscles of Facial Expression: None, normal Lips and Perioral Area: None, normal Jaw: None, normal Tongue: None, normal,Extremity Movements Upper (arms, wrists, hands, fingers): None, normal Lower (legs, knees, ankles, toes): None, normal, Trunk Movements Neck, shoulders, hips:  None, normal, Overall Severity Severity of abnormal movements (highest score from questions above): None, normal Incapacitation due to abnormal movements: None, normal Patient's awareness of abnormal movements (rate only patient's report): No Awareness, Dental Status Current problems with teeth and/or dentures?: No Does patient usually wear dentures?: No  CIWA:  CIWA-Ar Total: 1 COWS:     Musculoskeletal: Strength & Muscle Tone: within normal limits Gait & Station: normal Patient leans: N/A  Psychiatric Specialty Exam: Physical Exam  Nursing note and vitals reviewed.   Review of Systems  Constitutional: Negative for chills and fever.  Respiratory: Negative for cough and shortness of breath.   Cardiovascular: Negative for chest pain.  Gastrointestinal: Negative for heartburn.  Psychiatric/Behavioral: Negative for depression and suicidal  ideas.    Blood pressure (!) 163/74, pulse (!) 102, temperature 97.8 F (36.6 C), temperature source Oral, resp. rate 16, height 4' 11.45" (1.51 m), weight 62.1 kg (137 lb), SpO2 97 %.Body mass index is 27.25 kg/m.  General Appearance: Casual and Fairly Groomed  Eye Contact:  Good  Speech:  Clear and Coherent and Normal Rate  Volume:  Increased  Mood:  Angry and Dysphoric  Affect:  Blunt and Labile  Thought Process:  Coherent and Goal Directed  Orientation:  Full (Time, Place, and Person)  Thought Content:  Illogical, Delusions, Ideas of Reference:   Paranoia Delusions, Obsessions and Paranoid Ideation  Suicidal Thoughts:  No  Homicidal Thoughts:  No  Memory:  Immediate;   Fair Recent;   Fair Remote;   Fair  Judgement:  Poor  Insight:  Lacking  Psychomotor Activity:  Normal  Concentration:  Concentration: Fair  Recall:  [employer] of Knowledge:  Fair  Language:  Fair  Akathisia:  No  Handed:    AIMS (if indicated):     Assets:  Resilience  ADL's:  Intact  Cognition:  WNL  Sleep:  Number of Hours: 6.75    Treatment Plan  Summary: Daily contact with patient to assess and evaluate symptoms and progress in treatment and Medication management  - Second opinion for forced medications last renewed on 06/25/17   -Schizophrenia   - DC scheduled Invega Sustenna (pt never received Sustenna injection) - Start Haldol 5mg  po/IM BID (only give IM if pt refuses PO form)  -Anxiety/Agitation -Continuegabapentin 200mg  po BID -Continueatarax 25mg  po q8h prn anxiety -Discontinueagitation protocol with zydis/ativan/geodon   - Start Haldol 5mg  po/IM q6h prn agitation  - HTN -Continuelosartan 100mg  po qDay  - DMII -Continuemetformin 500mg  po BID  - Insomnia -Continuetrazodone 50mg  po qhs prn insomnia  -Encourage participation in groups and the therapeutic milieu  -Discharge planning will be ongoing   Micheal Likens, MD 06/26/2017, 11:22 AM

## 2017-06-26 NOTE — [facility] Group Notes (Signed)
LCSW Group Therapy Note   06/26/2017 1:15pm   Type of Therapy and Topic:  Group Therapy:  Trust and Honesty  Participation Level:  Did Not Attend  Description of Group:    In this group patients will be asked to explore the value of being honest.  Patients will be guided to discuss their thoughts, feelings, and behaviors related to honesty and trusting in others. Patients will process together how trust and honesty relate to forming relationships with peers, family members, and self. Each patient will be challenged to identify and express feelings of being vulnerable. Patients will discuss reasons why people are dishonest and identify alternative outcomes if one was truthful (to self or others). This group will be process-oriented, with patients participating in exploration of their own experiences, giving and receiving support, and processing challenge from other group members.   Therapeutic Goals: 1. Patient will identify why honesty is important to relationships and how honesty overall affects relationships.  2. Patient will identify a situation where they lied or were lied too and the  feelings, thought process, and behaviors surrounding the situation 3. Patient will identify the meaning of being vulnerable, how that feels, and how that correlates to being honest with self and others. 4. Patient will identify situations where they could have told the truth, but instead lied and explain reasons of dishonesty.   Summary of Patient Progress    Therapeutic Modalities:   Cognitive Behavioral Therapy Solution Focused Therapy Motivational Interviewing Brief Therapy  Ida RogueRodney B Laquia Rosano, LCSW 06/26/2017 10:20 AM

## 2017-06-26 NOTE — Plan of Care (Signed)
Patient is safe and free from injury.  Routine safety checks maintained every 15 minutes. 

## 2017-06-26 NOTE — Progress Notes (Signed)
Adult Psychoeducational Group Note  Date:  06/26/2017 Time:  4:34 PM  Group Topic/Focus:  Crisis Planning:   The purpose of this group is to help patients create a crisis plan for use upon discharge or in the future, as needed.  Participation Level:  Active  Participation Quality:  Appropriate and Attentive  Affect:  Appropriate  Cognitive:  Alert and Appropriate  Insight: Appropriate and Good  Engagement in Group:  Engaged  Modes of Intervention:  Discussion  Additional Comments:  Group discussed crisis management.  Patient was attentive and participated.  Sherri [name] 06/26/2017, 4:34 PM

## 2017-06-26 NOTE — Progress Notes (Signed)
DAR NOTE: Patient presents with anxious affect with mood lability.  Refused to engage with the doctor during assessment but instead was accusatory and forcefully shut her door against the provider.  Denies pain, auditory and visual hallucinations.  Patient described energy level as normal and concentration as good.  Rates depression at 0, hopelessness at 0, and anxiety at 0.  Maintained on routine safety checks.  Medications given as prescribed.  Support and encouragement offered as needed.   Patient remained isolative to her room.

## 2017-06-26 NOTE — Progress Notes (Signed)
Did not attend group 

## 2017-06-26 NOTE — Progress Notes (Signed)
Nursing Progress Note: 7p-7a D: Pt currently presents with a anxious/labile/pacing/disoriented/disorganized/tangential/whispering to herself/parnaoid affect and behavior. Not Interacting with the milieu. Pt reports fair sleep during the previous night with current medication regimen. Pt did not attend wrap-up group.  A: Pt provided with medications per providers orders. Pt's labs and vitals were monitored throughout the night. Pt supported emotionally and encouraged to express concerns and questions. Pt educated on medications.  R: Pt's safety ensured with 15 minute and environmental checks. Pt currently denies SI, HI, and AVH. Pt verbally contracts to seek staff if SI,HI, or AVH occurs and to consult with staff before acting on any harmful thoughts. Will continue to monitor.

## 2017-06-26 NOTE — Progress Notes (Signed)
Recreation Therapy Notes  Date: 06/26/17 Time: 1000 Location: 500 Hall Dayroom  Group Topic: Goal Setting  Goal Area(s) Addresses:  Patient will be able to identify at least 3 life goals.  Patient will be able to identify benefit of investing in life goals.  Patient will be able to identify benefit of setting life goals.   Intervention: Worksheet, pencils  Activity: Goal Planning.  Patients were to set goals they wanted to accomplish in a week, in the next month, in the next year and five years from now.  Patients were to then identify obstacles to reaching goals, what they need to do achieve their goals and what they can start doing tomorrow to work towards their goals.  Education:  Discharge Planning, Coping Skills   Education Outcome: Acknowledges Education/In Group Clarification Provided/Needs Additional Education  Clinical Observations:  Pt did not attend group.   Caroll RancherMarjette Jibreel Fedewa, [name]         Caroll RancherLindsay, Makinley Muscato A 06/26/2017 12:27 PM

## 2017-06-26 NOTE — Tx Team (Signed)
Interdisciplinary Treatment and Diagnostic Plan Update  06/26/2017 Time of Session: 9:49 AM  Sherri [name] MRN: [id number]  Principal Diagnosis: Schizophrenia, disorganized, chronic (HCC)  Secondary Diagnoses: Principal Problem:   Schizophrenia, disorganized, chronic (HCC)   Current Medications:  Current Facility-Administered Medications  Medication Dose Route Frequency Provider Last Rate Last Dose  . acetaminophen (TYLENOL) tablet 650 mg  650 mg Oral Q6H PRN Laveda Abbe, NP      . alum & mag hydroxide-simeth (MAALOX/MYLANTA) 200-200-20 MG/5ML suspension 30 mL  30 mL Oral Q4H PRN Laveda Abbe, NP      . calcium-vitamin D (OSCAL WITH D) 500-200 MG-UNIT per tablet 1 tablet  1 tablet Oral Q breakfast Laveda Abbe, NP   1 tablet at 06/26/17 5366  . hydrOXYzine (ATARAX/VISTARIL) tablet 25 mg  25 mg Oral Q8H Laveda Abbe, NP   25 mg at 06/25/17 2019  . losartan (COZAAR) tablet 100 mg  100 mg Oral Daily Laveda Abbe, NP   100 mg at 06/26/17 4403  . magnesium hydroxide (MILK OF MAGNESIA) suspension 30 mL  30 mL Oral Daily PRN Laveda Abbe, NP      . metFORMIN (GLUCOPHAGE) tablet 500 mg  500 mg Oral BID WC Laveda Abbe, NP   500 mg at 06/26/17 0818  . traZODone (DESYREL) tablet 50 mg  50 mg Oral QHS PRN Micheal Likens, MD        PTA Medications: Medications Prior to Admission  Medication Sig Dispense Refill Last Dose  . acetaminophen (TYLENOL) 500 MG tablet Take 1,000 mg by mouth every 8 (eight) hours as needed for mild pain.   unk  . calcium-vitamin D (OSCAL WITH D) 500-200 MG-UNIT tablet Take 1 tablet by mouth daily with breakfast.   Past Week at Unknown time  . chlorhexidine (PERIDEX) 0.12 % solution Use as directed 15 mLs in the mouth or throat 2 (two) times daily.   Past Week at Unknown time  . cholecalciferol (VITAMIN D) 1000 units tablet Take 2,000 Units by mouth daily.   Past Week at Unknown time  .  Cinnamon 500 MG capsule Take 500 mg by mouth daily.   Past Week at Unknown time  . Cranberry (CRANBERRY CONCENTRATE) 500 MG CAPS Take 1 capsule by mouth daily.   Past Week at Unknown time  . ferrous sulfate 325 (65 FE) MG tablet Take 325 mg by mouth daily with breakfast.   Past Week at Unknown time  . guaifenesin (HUMIBID E) 400 MG TABS tablet Take 400 mg by mouth every 8 (eight) hours as needed (mucus relief).   unk  . guaiFENesin-dextromethorphan (ROBITUSSIN DM) 100-10 MG/5ML syrup Take 10 mLs by mouth every 4 (four) hours as needed for cough.   unk  . hydrOXYzine (ATARAX/VISTARIL) 25 MG tablet Take 1 tablet (25 mg total) by mouth every 6 (six) hours as needed for anxiety. (Patient taking differently: Take 25 mg by mouth every 8 (eight) hours. ) 30 tablet 0 Past Week at Unknown time  . loratadine (CLARITIN) 10 MG tablet Take 10 mg by mouth daily as needed for allergies.   unk at Unknown time  . losartan (COZAAR) 100 MG tablet Take 100 mg by mouth daily.   Past Week at Unknown time  . metFORMIN (GLUCOPHAGE) 500 MG tablet Take 500 mg by mouth 2 (two) times daily with a meal.   Past Week at Unknown time  . Multiple Vitamin (MULTIVITAMIN WITH MINERALS) TABS tablet Take 1 tablet  by mouth daily.   Past Week at Unknown time  . risperiDONE (RISPERDAL) 0.5 MG tablet Take 1 tablet (0.5 mg total) by mouth 2 (two) times daily. 60 tablet 0 Past Week at Unknown time    Treatment Modalities: Medication Management, Group therapy, Case management,  1 to 1 session with clinician, Psychoeducation, Recreational therapy.  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  Therapeutic Interventions: 1 to 1 sessions, Unit Group sessions and Medication administration.  Evaluation of Outcomes: Progressing  Physician Treatment Plan for Secondary Diagnosis: Principal Problem:   Schizophrenia, disorganized, chronic (HCC)  Long Term Goal(s): Improvement in symptoms so as ready for discharge  Short Term Goals: Ability to identify and develop effective coping behaviors will improve Compliance with prescribed medications will improve Ability to identify and develop effective coping behaviors will improve  Medication Management: Evaluate patient's response, side effects, and tolerance of medication regimen.  Therapeutic Interventions: 1 to 1 sessions, Unit Group sessions and Medication administration.  Evaluation of Outcomes: Progressing   2/13: Pt remains paranoid, delusional, agitated and aggressive at time. She has poor insight. She has been refusing medications. We will change from Invega to zyprexa (PO or IM if pt refuses PO).   -DiscontinueInvega 6mg  po qDay             - Start zyprexa 5mg  po/IM qAM + 10mg  po/IM qhs (only give IM if pt refuses oral form)  -Anxiety/Agitation -Continuegabapentin 200mg  po BID -Continueatarax 25mg  po q8h prn anxiety -Continueagitation protocol with zydis/ativan/geodon  - HTN -Continuelosartan 100mg  po qDay  - DMII -Continuemetformin 500mg  po BID  2/21: Pt remains delusional, pressured, labile, and paranoid. She is unwilling to engage with treatment team. We will continue her current medication regimen and continue to investigate possibility of transfer to [place]. Pt was rejected yesterday at [facility] due  to their level of acuity.  - Second opinion for forced medications last renewed on 2/18 by Dr. Jackquline Berlin  -Schizophrenia -Continuezyprexa 5mg  po/IM qAM + 10mg  po/IM qhs (only give IM if pt refuses oral form)  -Anxiety/Agitation -Continuegabapentin 200mg  po BID -Continueatarax 25mg  po q8h prn anxiety -Continueagitation protocol with zydis/ativan/geodon  - HTN -Continuelosartan 100mg  po qDay  - DMII -Continuemetformin 500mg  po BID  2/26: Second opinion for forced medications last renewed on 2/18 by Dr. Jackquline Berlin - Pt now accepting of oral zyprexa at bedtime, but we will consider renewal of second opinion if pt refuses long-acting form of antipsychotic  -Schizophrenia -Continuezyprexa 5mg  po/IM qAM + 10mg  po/IM qhs (only give IM if pt refuses oral form)  -Anxiety/Agitation -Continuegabapentin 200mg  po BID -Continueatarax 25mg  po q8h prn anxiety -Continueagitation protocol with zydis/ativan/geodon  - HTN -Continuelosartan 100mg  po qDay  - DMII -Continuemetformin 500mg  po BID  - Insomnia -Continuetrazodone 50mg  po qhs prn insomnia  2/28: -Schizophrenia             - DC scheduled Invega Sustenna (pt never received Sustenna injection) - Start Haldol 5mg  po/IM BID (only give IM if pt refuses PO form)  -Anxiety/Agitation -Continuegabapentin 200mg  po BID -Continueatarax 25mg  po q8h  prn anxiety -Discontinueagitation protocol with zydis/ativan/geodon            - Start Haldol 5mg  po/IM q6h prn agitation          RN Treatment Plan for Primary Diagnosis: Schizophrenia, disorganized, chronic (HCC) Long Term Goal(s): Knowledge of disease and therapeutic regimen to maintain health will improve  Short Term Goals: Ability to participate  in decision making will improve, Ability to identify and develop effective coping behaviors will improve and Compliance with prescribed medications will improve  Medication Management: RN will administer medications as ordered by provider, will assess and evaluate patient's response and provide education to patient for prescribed medication. RN will report any adverse and/or side effects to prescribing provider.  Therapeutic Interventions: 1 on 1 counseling sessions, Psychoeducation, Medication administration, Evaluate responses to treatment, Monitor vital signs and CBGs as ordered, Perform/monitor CIWA, COWS, AIMS and Fall Risk screenings as ordered, Perform wound care treatments as ordered.  Evaluation of Outcomes: Progressing   LCSW Treatment Plan for Primary Diagnosis: Schizophrenia, disorganized, chronic (HCC) Long Term Goal(s): Safe transition to appropriate next level of care at discharge, Engage patient in therapeutic group addressing interpersonal concerns.  Short Term Goals: Engage patient in aftercare planning with referrals and resources, Facilitate acceptance of mental health diagnosis and concerns, Identify triggers associated with mental health/substance abuse issues and Increase skills for wellness and recovery  Therapeutic Interventions: Assess for all discharge needs, 1 to 1 time with Social worker, Explore available resources and support systems, Assess for adequacy in community support network, Educate family and significant other(s) on suicide prevention, Complete Psychosocial Assessment, Interpersonal group therapy.  Evaluation of Outcomes: Progressing   Progress in Treatment: Attending groups: No Participating in groups: No Taking medication as prescribed: Yes Toleration of medication: Yes, no side effects reported at this time Family/Significant other contact made: Aaron EdelmanYes, [name] LePage [phone] (Brother/Legal Guardian) Patient understands diagnosis: No, limited  insight Discussing patient identified problems/goals with staff: Yes Medical problems stabilized or resolved: Yes Denies suicidal/homicidal ideation: Yes Issues/concerns per patient self-inventory: None Other: N/A  New problem(s) identified: None identified at this time.   New Short Term/Long Term Goal(s): "I'm not supposed to be here.  I am supposed to be [facility]".   Discharge Plan or Barriers: Upon discharge [facility] and [facility].   Reason for Continuation of Hospitalization: Delusions  Hallucinations Medication stabilization   Estimated Length of Stay: 07/01/17  Attendees: Patient:  06/26/2017  9:49 AM  Physician: Jolyne Loahristopher Rainville, MD 06/26/2017  9:49 AM  Nursing: Estella HuskElizabeth Awofabeju, RN 06/26/2017  9:49 AM  RN Care Manager: Onnie BoerJennifer Clark, RN 06/26/2017  9:49 AM  Social Worker: Richelle Itood Gergory Biello, LCSW; Melba CoonAngel Webster, Social Work Intern 06/26/2017  9:49 AM  Recreational Therapist: Caroll RancherMarjette [name], LRT 06/26/2017  9:49 AM  Other: Tomasita Morrowelora Sutton, P4CC 06/26/2017  9:49 AM  Other:  06/26/2017  9:49 AM  Other: 06/26/2017  9:49 AM    Scribe for Treatment Team: Ida Rogueodney B Callia Swim, LCSW 06/26/2017 9:49 AM

## 2017-06-27 NOTE — Progress Notes (Signed)
DAR NOTE: Patient presents with anxious affect with mood lability.  Continues to be disorganized, paranoid and tangential thoughts process.  Denies suicidal thoughts, pain, auditory and visual hallucinations.  Described energy level as normal and concentration as good.  Rates depression at 0, hopelessness at 0, and anxiety at 0.  Maintained on routine safety checks.  Argumentative with staff before taking her prescribed medications. Threatened to sue staff and the hospital.  Support and encouragement offered as needed.  Patient remained withdrawn and isolates to her room.  Minimal interaction with staff.

## 2017-06-27 NOTE — Progress Notes (Signed)
D: Pt very confrontational and verbally threatening. " you are a Black man talking over me" . 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 Pt was informed of her IVC status and the forced medication order by the doctor . Pt continued to be confrontational , wanting pt last name . "I bet you were in the military". Pt talked very condescending to Clinical research associatewriter, pt was informed that Clinical research associatewriter was here to help her and if she did not need anything our conversation was over, pt continued to try to argue with Clinical research associatewriter, it was informed to pt that I would not be engaging in an argument with Clinical research associatewriter.   A: Pt was offered support and encouragement. Pt was given scheduled medications. Pt was encourage to attend groups. Q 15 minute checks were done for safety.   R:Pt attends groups and interacts well with peers and staff. Pt is taking medication. .Pt receptive to treatment and safety maintained on unit.

## 2017-06-27 NOTE — Progress Notes (Signed)
Pt told me to check my bank account, that her brother has the account of every [employer] employee and he authorized them to take out $200.00 dollars our my account

## 2017-06-27 NOTE — Progress Notes (Signed)
[facility] MD Progress Note  06/27/2017 1:18 PM Sherri [name]  MRN:  [id number] Subjective:    Sherri [name] is a 62 y/o F with history of schizophrenia who was admitted with worening psychosis and paranoia. Pt has a guardian whom is her brother. She has received a second opinion for forced medications which has been renewed multiple times during her admission - most recently on 06/25/17.  She remains paranoid, guarded, and uncooperative with the treatment team. SW team has been exploring option of transfer to [facility] inpatient psychiatry unit, but that remains to be an unavailable option at this time. Pt was changed from zyprexa to haldol due to lack of efficacy regarding her presenting symptoms and availability of haldol as a long-acting injectable form. Pt has been accepting of oral haldol only when reminded she will have to take oral form or receive forced injection of haldol.  Upon evaluation today, pt was evaluated in her room with female RN staff present for comfort of the patient. Similar to previous encounters, pt is pressured, irritable, paranoid, and becomes mildly agitated during the interview. She denies any physical complaints. She denies SI/HI/AH/VH. She then refuses to answer any further questions. She demands for immediate discharge and threatens this provider that I am now under a law suit as well as the hospital. Pt referenced delusional content that this provider is a CNA who is illegally keeping her against her will and against the will of her brother (guardian). Pt becomes increasingly irritable and hostile to this provider, stating, "What's your Halloween costume a fat sumo with a donut ball?" and "You sound like a retard!" Attempted to discuss with patient that we would like to plan for a phone conference with her brother on Monday 06/30/17 but pt then attempted to slam her room door in this provider's face, so interview was concluded.  Principal Problem: Schizophrenia,  disorganized, chronic (HCC) Diagnosis:   Patient Active Problem List   Diagnosis Date Noted  . 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 Schizoaffective disorder, bipolar type (HCC) [F25.0] 05/31/2017  . 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 Bipolar 1 disorder (HCC)   . Hypertension   . Prediabetes   . Schizophrenia (HCC)   . Tachycardia     Past Surgical History:  Procedure Laterality Date  . COLON SURGERY     Family History: History reviewed. No pertinent family history. Family Psychiatric  History: see H&P Social History:  Social History   Substance and Sexual Activity  Alcohol Use No  . Frequency: Never     Social History   Substance and Sexual Activity  Drug Use No    Social History   Socioeconomic History  . Marital status: Divorced    Spouse name: None  . Number of children: None  . Years of education: None  . Highest education level: None  Social Needs  . Financial resource strain: None  . Food insecurity - worry: None  . Food insecurity - inability: None  . Transportation needs - medical: None  . Transportation needs - non-medical: None  Occupational History  . None  Tobacco Use  . Smoking status: Never Smoker  . Smokeless tobacco: Never Used  Substance and Sexual Activity  . Alcohol use: No    Frequency: Never  . Drug use: No  .  Sexual activity: None  Other Topics Concern  . None  Social History Narrative  . None   Additional Social History:                         Sleep: Good  Appetite:  Good  Current Medications: Current Facility-Administered Medications  Medication Dose Route Frequency Provider Last Rate Last Dose  . acetaminophen (TYLENOL) tablet 650 mg  650 mg Oral Q6H PRN Laveda AbbeParks, Laurie Britton, NP      . alum & mag  hydroxide-simeth (MAALOX/MYLANTA) 200-200-20 MG/5ML suspension 30 mL  30 mL Oral Q4H PRN Laveda AbbeParks, Laurie Britton, NP      . calcium-vitamin D (OSCAL WITH D) 500-200 MG-UNIT per tablet 1 tablet  1 tablet Oral Q breakfast Laveda AbbeParks, Laurie Britton, NP   1 tablet at 06/27/17 [id number]  . haloperidol (HALDOL) tablet 5 mg  5 mg Oral BID Jolyne Loaainville, Gaylan Fauver T, MD   5 mg at 06/27/17 [id number]   Or  . haloperidol lactate (HALDOL) injection 5 mg  5 mg Intramuscular BID Jolyne Loaainville, Jacquelina Hewins T, MD      . haloperidol (HALDOL) tablet 5 mg  5 mg Oral Q6H PRN Micheal Likensainville, Scotti Motter T, MD       Or  . haloperidol lactate (HALDOL) injection 5 mg  5 mg Intramuscular Q6H PRN Micheal Likensainville, Paxton Binns T, MD      . hydrOXYzine (ATARAX/VISTARIL) tablet 25 mg  25 mg Oral Q8H Laveda AbbeParks, Laurie Britton, NP   25 mg at 06/26/17 2059  . losartan (COZAAR) tablet 100 mg  100 mg Oral Daily Laveda AbbeParks, Laurie Britton, NP   100 mg at 06/27/17 0743  . magnesium hydroxide (MILK OF MAGNESIA) suspension 30 mL  30 mL Oral Daily PRN Laveda AbbeParks, Laurie Britton, NP      . metFORMIN (GLUCOPHAGE) tablet 500 mg  500 mg Oral BID WC Laveda AbbeParks, Laurie Britton, NP   500 mg at 06/27/17 0743  . traZODone (DESYREL) tablet 50 mg  50 mg Oral QHS PRN Micheal Likensainville, Isyss Espinal T, MD        Lab Results: No results found for this or any previous visit (from the past 48 hour(s)).  Blood Alcohol level:  Lab Results  Component Value Date   ETH <10 05/30/2017   ETH <10 04/11/2017    Metabolic Disorder Labs: No results found for: HGBA1C, MPG No results found for: PROLACTIN No results found for: CHOL, TRIG, HDL, CHOLHDL, VLDL, LDLCALC  Physical Findings: AIMS: Facial and Oral Movements Muscles of Facial Expression: None, normal Lips and Perioral Area: None, normal Jaw: None, normal Tongue: None, normal,Extremity Movements Upper (arms, wrists, hands, fingers): None, normal Lower (legs, knees, ankles, toes): None, normal, Trunk Movements Neck, shoulders, hips: None, normal,  Overall Severity Severity of abnormal movements (highest score from questions above): None, normal Incapacitation due to abnormal movements: None, normal Patient's awareness of abnormal movements (rate only patient's report): No Awareness, Dental Status Current problems with teeth and/or dentures?: No Does patient usually wear dentures?: No  CIWA:  CIWA-Ar Total: 1 COWS:     Musculoskeletal: Strength & Muscle Tone: within normal limits Gait & Station: normal Patient leans: N/A  Psychiatric Specialty Exam: Physical Exam  Nursing note and vitals reviewed.   Review of Systems  Constitutional: Negative for chills and fever.  Respiratory: Negative for cough and shortness of breath.   Cardiovascular: Negative for chest pain.  Gastrointestinal: Negative for abdominal pain, heartburn, nausea and vomiting.  Psychiatric/Behavioral: Negative for depression, hallucinations and suicidal  ideas. The patient is not nervous/anxious and does not have insomnia.     Blood pressure (!) 163/74, pulse (!) 102, temperature 97.8 F (36.6 C), temperature source Oral, resp. rate 16, height 4' 11.45" (1.51 m), weight 62.1 kg (137 lb), SpO2 97 %.Body mass index is 27.25 kg/m.  General Appearance: Casual and Fairly Groomed  Eye Contact:  Good  Speech:  Clear and Coherent and Pressured  Volume:  Increased  Mood:  Angry, Dysphoric and Irritable  Affect:  Blunt, Congruent and Labile  Thought Process:  Coherent, Goal Directed and Descriptions of Associations: Loose  Orientation:  Full (Time, Place, and Person)  Thought Content:  Illogical, Delusions, Ideas of Reference:   Paranoia Delusions, Paranoid Ideation and Rumination  Suicidal Thoughts:  No  Homicidal Thoughts:  No  Memory:  Immediate;   Fair Recent;   Fair Remote;   Poor  Judgement:  Poor  Insight:  Lacking  Psychomotor Activity:  Normal  Concentration:  Concentration: Fair  Recall:  [employer] of Knowledge:  Fair  Language:  Fair  Akathisia:   No  Handed:    AIMS (if indicated):     Assets:  Resilience  ADL's:  Intact  Cognition:  WNL  Sleep:  Number of Hours: 6    Treatment Plan Summary: Daily contact with patient to assess and evaluate symptoms and progress in treatment and Medication management   - Second opinion for forced medications last renewed on 06/25/17   -Schizophrenia          - Continue Haldol 5mg  po/IM BID (only give IM if pt refuses PO form)  -Anxiety/Agitation -Continueatarax 25mg  po q8h prn anxiety   - Continue Haldol 5mg  po/IM q6h prn agitation  - HTN -Continuelosartan 100mg  po qDay  - DMII -Continuemetformin 500mg  po BID  - Insomnia -Continuetrazodone 50mg  po qhs prn insomnia  -Encourage participation in groups and the therapeutic milieu  -Discharge planning will be ongoing   Micheal Likens, MD 06/27/2017, 1:18 PM

## 2017-06-27 NOTE — Plan of Care (Signed)
  Safety: Periods of time without injury will increase 06/27/2017 2026 - Progressing by Delos HaringPhillips, Roxan Yamamoto A, RN Note Pt safe on the unit at this time

## 2017-06-27 NOTE — Progress Notes (Signed)
Recreation Therapy Notes  Date: 06/27/17 Time: 1000 Location: 500 Hall Dayroom  Group Topic: Communication, Team Building, Problem Solving  Goal Area(s) Addresses:  Patient will effectively work with peer towards shared goal.  Patient will identify skill used to make activity successful.  Patient will identify how skills used during activity can be used to reach post d/c goals.   Intervention: STEM Activity   Activity: [employer]er Tower. In teams, patients were asked to build the tallest freestanding tower possible out of 15 pipe cleaners. Systematically resources were removed, for example patient ability to use both hands and patient ability to verbally communicate.    Education: Pharmacist, communityocial Skills, Building control surveyorDischarge Planning.   Education Outcome: Acknowledges education/In group clarification offered/Needs additional education.   Clinical Observations/Feedback: Pt did not attend group.    Caroll RancherMarjette Ashliegh Parekh, [name]         Lillia AbedLindsay, [name] A 06/27/2017 11:08 AM

## 2017-06-28 NOTE — [facility] Group Notes (Signed)
[facility] Group Notes:  (Nursing/MHT/Case Management/Adjunct)  Date:  06/28/2017  Time:  9:55 AM  Type of Therapy:  Psychoeducational Skills  Participation Level:  Did Not Attend  Participation Quality:  Did not attend  Affect:  Did not attend  Cognitive:  Did not attend  Insight:  None  Engagement in Group:  Did not attend  Modes of Intervention:  Did not attend  Summary of Progress/Problems: Pt did not attend Psychoeducational group with topic anger management.   Jacquelyne BalintForrest, Connee Ikner Shanta 06/28/2017, 9:55 AM

## 2017-06-28 NOTE — [facility] Group Notes (Signed)
[facility] Group Notes:  (Nursing/MHT/Case Management/Adjunct)  Date:  06/28/2017  Time:  9:04 AM  Type of Therapy:  Orientation/Goals group  Participation Level:  Did Not Attend  Participation Quality:  Did Not Attend  Affect:  Did Not Attend  Cognitive:  Did Not Attend  Insight:  None  Engagement in Group:  Did Not Attend  Modes of Intervention:  Did Not Attend  Summary of Progress/Problems: Pt did not attend patient self inventory group/orientation group.   Jacquelyne BalintForrest, Karliah Kowalchuk Shanta 06/28/2017, 9:04 AM

## 2017-06-28 NOTE — BHH Counselor (Signed)
Clinical Social Work Note  Phone conversation held with patient's brother/legal guardian Sherri [name] who called for a check on patient.  He cannot talk on Monday due to work conflicts but would be happy to have a phone conference on Tuesday or Wednesday next week.  He gave background information on patient that is likely already known by Dr. Altamese Carolinaainville and weekday CSW.  Sherri MantleMareida Grossman-Orr, LCSW 06/28/2017, 2:00 PM

## 2017-06-28 NOTE — Progress Notes (Signed)
Patient has been isolative to her room and only came out to take her medication and get snack before returning to her room. She is delusional and suspicious reporting that her brother is a Sales executivehacker and our computer will be hacked. Safety maintained on unit with 15 min checks.

## 2017-06-28 NOTE — Progress Notes (Signed)
[facility] MD Progress Note  06/28/2017 2:58 PM Sherri [name]  MRN:  [id number]  Subjective: Sherri [name] reports, "Do you know that my brother is wondering why I'm still here. I am only here for medication evaluation for the [place]. I'm suppose to go to the [place] in [place] or [place]. The charges are now reversed. I'm not suppose to go by a civilian transportation. That is the part all of you here are not understanding. Do you know that I have over stayed in this place beyond that allowable time for any veteran".    Objective: Sherri [name] is a 62 y/o F with history of schizophrenia who was admitted with worening psychosis and paranoia. Pt has a guardian whom is her brother. She has received a second opinion for forced medications which has been renewed multiple times during her admission - most recently on 06/25/17.  She remains paranoid, guarded, and uncooperative with the treatment team. SW team has been exploring option of transfer to [facility] inpatient psychiatry unit, but that remains to be an unavailable option at this time. Pt was changed from zyprexa to haldol due to lack of efficacy regarding her presenting symptoms and availability of haldol as a long-acting injectable form. Pt has been accepting of oral haldol only when reminded she will have to take oral form or receive forced injection of haldol.  Upon evaluation today, pt was evaluated in her room. Similar to previous encounters, pt is pressured, irritable, paranoid, and becomes mildly agitated during the interview. She denies any physical complaints. She denies SI/HI/AH/VH.  She continues to demand for immediate discharge and threatens that this hospital is now under a law suit filed by her brother. A plan for a phone conference with her brother is on Monday 06/30/17. She is walking up & down the hall way in no apparent distress.  Principal Problem: Schizophrenia, disorganized, chronic (HCC) Diagnosis:   Patient Active Problem List    Diagnosis Date Noted  . 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 Schizoaffective disorder, bipolar type (HCC) [F25.0] 05/31/2017  . 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 Bipolar 1 disorder (HCC)   . Hypertension   . Prediabetes   . Schizophrenia (HCC)   . Tachycardia     Past Surgical History:  Procedure Laterality Date  . COLON SURGERY     Family History: History reviewed. No pertinent family history.  Family Psychiatric  History: See H&P  Social History:  Social History   Substance and Sexual Activity  Alcohol Use No  . Frequency: Never     Social History   Substance and Sexual Activity  Drug Use No    Social History   Socioeconomic History  . Marital status: Divorced    Spouse name: None  . Number of children: None  . Years of education: None  . Highest education level: None  Social Needs  . Financial resource strain: None  . Food insecurity - worry: None  . Food insecurity - inability: None  . Transportation needs - medical: None  . Transportation needs - non-medical: None  Occupational History  . None  Tobacco Use  . Smoking status: Never Smoker  . Smokeless tobacco: Never Used  Substance and Sexual Activity  . Alcohol use: No    Frequency: Never  . Drug use: No  .  Sexual activity: None  Other Topics Concern  . None  Social History Narrative  . None   Additional Social History:   Sleep: Good  Appetite:  Good  Current Medications: Current Facility-Administered Medications  Medication Dose Route Frequency Provider Last Rate Last Dose  . acetaminophen (TYLENOL) tablet 650 mg  650 mg Oral Q6H PRN Laveda AbbeParks, Laurie Britton, NP      . alum & mag hydroxide-simeth (MAALOX/MYLANTA) 200-200-20 MG/5ML suspension 30 mL  30 mL Oral Q4H PRN Laveda AbbeParks, Laurie  Britton, NP      . calcium-vitamin D (OSCAL WITH D) 500-200 MG-UNIT per tablet 1 tablet  1 tablet Oral Q breakfast Laveda AbbeParks, Laurie Britton, NP   1 tablet at 06/28/17 0813  . haloperidol (HALDOL) tablet 5 mg  5 mg Oral BID Jolyne Loaainville, Christopher T, MD   5 mg at 06/28/17 0813   Or  . haloperidol lactate (HALDOL) injection 5 mg  5 mg Intramuscular BID Jolyne Loaainville, Christopher T, MD      . haloperidol (HALDOL) tablet 5 mg  5 mg Oral Q6H PRN Micheal Likensainville, Christopher T, MD       Or  . haloperidol lactate (HALDOL) injection 5 mg  5 mg Intramuscular Q6H PRN Micheal Likensainville, Christopher T, MD      . hydrOXYzine (ATARAX/VISTARIL) tablet 25 mg  25 mg Oral Q8H Laveda AbbeParks, Laurie Britton, NP   25 mg at 06/28/17 0615  . losartan (COZAAR) tablet 100 mg  100 mg Oral Daily Laveda AbbeParks, Laurie Britton, NP   100 mg at 06/28/17 0813  . magnesium hydroxide (MILK OF MAGNESIA) suspension 30 mL  30 mL Oral Daily PRN Laveda AbbeParks, Laurie Britton, NP      . metFORMIN (GLUCOPHAGE) tablet 500 mg  500 mg Oral BID WC Laveda AbbeParks, Laurie Britton, NP   500 mg at 06/28/17 0813  . traZODone (DESYREL) tablet 50 mg  50 mg Oral QHS PRN Micheal Likensainville, Christopher T, MD        Lab Results: No results found for this or any previous visit (from the past 48 hour(s)).  Blood Alcohol level:  Lab Results  Component Value Date   ETH <10 05/30/2017   ETH <10 04/11/2017    Metabolic Disorder Labs: No results found for: HGBA1C, MPG No results found for: PROLACTIN No results found for: CHOL, TRIG, HDL, CHOLHDL, VLDL, LDLCALC  Physical Findings: AIMS: Facial and Oral Movements Muscles of Facial Expression: None, normal Lips and Perioral Area: None, normal Jaw: None, normal Tongue: None, normal,Extremity Movements Upper (arms, wrists, hands, fingers): None, normal Lower (legs, knees, ankles, toes): None, normal, Trunk Movements Neck, shoulders, hips: None, normal, Overall Severity Severity of abnormal movements (highest score from questions above): None,  normal Incapacitation due to abnormal movements: None, normal Patient's awareness of abnormal movements (rate only patient's report): No Awareness, Dental Status Current problems with teeth and/or dentures?: No Does patient usually wear dentures?: No  CIWA:  CIWA-Ar Total: 1 COWS:     Musculoskeletal: Strength & Muscle Tone: within normal limits Gait & Station: normal Patient leans: N/A  Psychiatric Specialty Exam: Physical Exam  Nursing note and vitals reviewed.   Review of Systems  Constitutional: Negative for chills and fever.  Respiratory: Negative for cough and shortness of breath.   Cardiovascular: Negative for chest pain.  Gastrointestinal: Negative for abdominal pain, heartburn, nausea and vomiting.  Psychiatric/Behavioral: Negative for depression, hallucinations and suicidal ideas. The patient is not nervous/anxious and does not have insomnia.     Blood pressure (!) 142/66, pulse (!) 109,  temperature 97.8 F (36.6 C), temperature source Oral, resp. rate 16, height 4' 11.45" (1.51 m), weight 62.1 kg (137 lb), SpO2 97 %.Body mass index is 27.25 kg/m.  General Appearance: Casual and Fairly Groomed  Eye Contact:  Good  Speech:  Clear and Coherent and Pressured  Volume:  Increased  Mood:  Angry, Dysphoric and Irritable  Affect:  Blunt, Congruent and Labile  Thought Process:  Coherent, Goal Directed and Descriptions of Associations: Loose  Orientation:  Full (Time, Place, and Person)  Thought Content:  Illogical, Delusions, Ideas of Reference:   Paranoia Delusions, Paranoid Ideation and Rumination  Suicidal Thoughts:  No  Homicidal Thoughts:  No  Memory:  Immediate;   Fair Recent;   Fair Remote;   Poor  Judgement:  Poor  Insight:  Lacking  Psychomotor Activity:  Normal  Concentration:  Concentration: Fair  Recall:  [employer] of Knowledge:  Fair  Language:  Fair  Akathisia:  No  Handed:    AIMS (if indicated):     Assets:  Resilience  ADL's:  Intact   Cognition:  WNL  Sleep:  Number of Hours: 4.75   Treatment Plan Summary: Daily contact with patient to assess and evaluate symptoms and progress in treatment and Medication management  - Will continue today 06/28/2017 plan as below except where it is noted.  - Second opinion for forced medications last renewed on 06/25/17   -Schizophrenia          - Continue Haldol 5mg  po/IM BID (only give IM if pt refuses PO form)  -Anxiety/Agitation -Continueatarax 25mg  po q8h prn anxiety   - Continue Haldol 5mg  po/IM q6h prn agitation  - HTN -Continuelosartan 100mg  po qDay  - DMII -Continuemetformin 500mg  po BID  - Insomnia -Continuetrazodone 50mg  po qhs prn insomnia  -Encourage participation in groups and the therapeutic milieu  -Discharge planning will be ongoing   Armandina Stammer, NP, PMHNP, FNP-BC. 06/28/2017, 2:58 PMPatient ID: Sherri [name], female   DOB: 1955-09-03, 62 y.o.   MRN: [id number]

## 2017-06-28 NOTE — [facility] Group Notes (Signed)
LCSW Group Therapy Note  06/28/2017 10:00AM - 11:15 - [address], 11:15-12:00 - [address]  Type of Therapy and Topic:  Group Therapy: Anger Cues and Responses  Participation Level:  Did Not Attend   Description of Group:   In this group, patients learned how to recognize the physical, cognitive, emotional, and behavioral responses they have to anger-provoking situations.  They identified a recent time they became angry and how they reacted.  They analyzed how their reaction was possibly beneficial and how it was possibly unhelpful.  The group discussed a variety of healthier coping skills that could help with such a situation in the future.  Deep breathing was practiced briefly.  Therapeutic Goals: 1. Patients will remember their last incident of anger and how they felt emotionally and physically, what their thoughts were at the time, and how they behaved. 2. Patients will identify how their behavior at that time worked for them, as well as how it worked against them. 3. Patients will explore possible new behaviors to use in future anger situations. 4. Patients will learn that anger itself is normal and cannot be eliminated, and that healthier reactions can assist with resolving conflict rather than worsening situations.  Summary of Patient Progress:  N/A  Therapeutic Modalities:   Cognitive Behavioral Therapy  Lynnell ChadMareida J Grossman-Orr  06/28/2017 9:08 AM

## 2017-06-28 NOTE — Progress Notes (Signed)
Did not attend group 

## 2017-06-29 NOTE — Progress Notes (Signed)
Writer has observed patient lying in bed resting but not asleep. She has been isolative to her room. She was informed of her medications and was compliant.She requested soap, got her snack and returned to her room. Safety maintained on unit with 15 min checks.

## 2017-06-29 NOTE — Progress Notes (Signed)
D: [name] has been isolative to her room. She denied SI, HI, and AVH. She tried to refuse having her blood pressure measured initially, just as she initially refused to take meds, but she eventually consented to both. She remains paranoid, telling staff that they will be sued and that money will be coming out of their bank accounts as a result within a week.   A: Meds given as ordered. Q15 safety checks maintained. Support/encouragement offered.  R: Pt remains free from harm and continues with treatment. Will continue to monitor for needs/safety.

## 2017-06-29 NOTE — [facility] Group Notes (Signed)
Pt did not attend wrap up group this evening. Pt stayed in their room instead 

## 2017-06-29 NOTE — Progress Notes (Signed)
[facility] MD Progress Note  06/29/2017 4:01 PM CHIANTI GOH  MRN:  [id number]  Subjective: Sherri [name] reports, "Do you know how long I have been in this hospital? When am I getting discharged, do you know? Do you know that there is a law suit against this hospital for keeping here this long?   Objective: Sherri [name] is a 62 y/o F with history of schizophrenia who was admitted with worening psychosis and paranoia. Pt has a guardian whom is her brother. She has received a second opinion for forced medications which has been renewed multiple times during her admission - most recently on 06/25/17.  She remains paranoid, guarded, and uncooperative with the treatment team. SW team has been exploring option of transfer to [facility] inpatient psychiatry unit, but that remains to be an unavailable option at this time. Pt was changed from zyprexa to haldol due to lack of efficacy regarding her presenting symptoms and availability of haldol as a long-acting injectable form. Pt has been accepting of oral haldol only when reminded she will have to take oral form or receive forced injection of haldol.  Upon evaluation today, pt was evaluated in her room. Similar to previous encounters, pt is pressured, irritable, paranoid, and becomes mildly agitated during the interview. She denies any physical complaints. She denies SI/HI/AH/VH.  She continues to demand for immediate discharge and threatens that this hospital is now under a law suit filed by her brother. A plan for a phone conference with her brother is on Monday 06/30/17. She is walking up & down the hall way in no apparent distress.  Principal Problem: Schizophrenia, disorganized, chronic (HCC) Diagnosis:   Patient Active Problem List   Diagnosis Date Noted  . 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 Schizoaffective disorder, bipolar type (HCC) [F25.0] 05/31/2017  . 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 Bipolar 1 disorder (HCC)   . Hypertension   . Prediabetes   . Schizophrenia (HCC)   . Tachycardia     Past Surgical History:  Procedure Laterality Date  . COLON SURGERY     Family History: History reviewed. No pertinent family history.  Family Psychiatric  History: See H&P  Social History:  Social History   Substance and Sexual Activity  Alcohol Use No  . Frequency: Never     Social History   Substance and Sexual Activity  Drug Use No    Social History   Socioeconomic History  . Marital status: Divorced    Spouse name: None  . Number of children: None  . Years of education: None  . Highest education level: None  Social Needs  . Financial resource strain: None  . Food insecurity - worry: None  . Food insecurity - inability: None  . Transportation needs - medical: None  . Transportation needs - non-medical: None  Occupational History  . None  Tobacco Use  . Smoking status: Never Smoker  . Smokeless tobacco: Never Used  Substance and Sexual Activity  . Alcohol use: No    Frequency: Never  . Drug use: No  . Sexual activity: None  Other Topics Concern  . None  Social History Narrative  . None   Additional Social History:   Sleep: Good  Appetite:  Good  Current Medications: Current Facility-Administered Medications  Medication Dose Route Frequency  Provider Last Rate Last Dose  . acetaminophen (TYLENOL) tablet 650 mg  650 mg Oral Q6H PRN Laveda Abbe, NP      . alum & mag hydroxide-simeth (MAALOX/MYLANTA) 200-200-20 MG/5ML suspension 30 mL  30 mL Oral Q4H PRN Laveda Abbe, NP      . calcium-vitamin D (OSCAL WITH D) 500-200 MG-UNIT per tablet 1 tablet  1 tablet Oral Q breakfast Laveda Abbe, NP   1 tablet at 06/29/17 0745  . haloperidol (HALDOL) tablet 5 mg  5 mg Oral  BID Jolyne Loa T, MD   5 mg at 06/29/17 0745   Or  . haloperidol lactate (HALDOL) injection 5 mg  5 mg Intramuscular BID Jolyne Loa T, MD      . haloperidol (HALDOL) tablet 5 mg  5 mg Oral Q6H PRN Micheal Likens, MD       Or  . haloperidol lactate (HALDOL) injection 5 mg  5 mg Intramuscular Q6H PRN Micheal Likens, MD      . hydrOXYzine (ATARAX/VISTARIL) tablet 25 mg  25 mg Oral Q8H Laveda Abbe, NP   25 mg at 06/29/17 1610  . losartan (COZAAR) tablet 100 mg  100 mg Oral Daily Laveda Abbe, NP   100 mg at 06/29/17 0759  . magnesium hydroxide (MILK OF MAGNESIA) suspension 30 mL  30 mL Oral Daily PRN Laveda Abbe, NP      . metFORMIN (GLUCOPHAGE) tablet 500 mg  500 mg Oral BID WC Laveda Abbe, NP   500 mg at 06/29/17 0759  . traZODone (DESYREL) tablet 50 mg  50 mg Oral QHS PRN Micheal Likens, MD       Lab Results: No results found for this or any previous visit (from the past 48 hour(s)).  Blood Alcohol level:  Lab Results  Component Value Date   ETH <10 05/30/2017   ETH <10 04/11/2017   Metabolic Disorder Labs: No results found for: HGBA1C, MPG No results found for: PROLACTIN No results found for: CHOL, TRIG, HDL, CHOLHDL, VLDL, LDLCALC  Physical Findings: AIMS: Facial and Oral Movements Muscles of Facial Expression: None, normal Lips and Perioral Area: None, normal Jaw: None, normal Tongue: None, normal,Extremity Movements Upper (arms, wrists, hands, fingers): None, normal Lower (legs, knees, ankles, toes): None, normal, Trunk Movements Neck, shoulders, hips: None, normal, Overall Severity Severity of abnormal movements (highest score from questions above): None, normal Incapacitation due to abnormal movements: None, normal Patient's awareness of abnormal movements (rate only patient's report): No Awareness, Dental Status Current problems with teeth and/or dentures?: No Does patient usually  wear dentures?: No  CIWA:  CIWA-Ar Total: 1 COWS:     Musculoskeletal: Strength & Muscle Tone: within normal limits Gait & Station: normal Patient leans: N/A  Psychiatric Specialty Exam: Physical Exam  Nursing note and vitals reviewed.   Review of Systems  Constitutional: Negative for chills and fever.  Respiratory: Negative for cough and shortness of breath.   Cardiovascular: Negative for chest pain.  Gastrointestinal: Negative for abdominal pain, heartburn, nausea and vomiting.  Psychiatric/Behavioral: Negative for depression, hallucinations and suicidal ideas. The patient is not nervous/anxious and does not have insomnia.     Blood pressure (!) 147/69, pulse (!) 107, temperature 97.8 F (36.6 C), temperature source Oral, resp. rate 16, height 4' 11.45" (1.51 m), weight 62.1 kg (137 lb), SpO2 97 %.Body mass index is 27.25 kg/m.  General Appearance: Casual and Fairly Groomed  Eye Contact:  Good  Speech:  Clear and Coherent and Pressured  Volume:  Increased  Mood:  Angry, Dysphoric and Irritable  Affect:  Blunt, Congruent and Labile  Thought Process:  Coherent, Goal Directed and Descriptions of Associations: Loose  Orientation:  Full (Time, Place, and Person)  Thought Content:  Illogical, Delusions, Ideas of Reference:   Paranoia Delusions, Paranoid Ideation and Rumination  Suicidal Thoughts:  No  Homicidal Thoughts:  No  Memory:  Immediate;   Fair Recent;   Fair Remote;   Poor  Judgement:  Poor  Insight:  Lacking  Psychomotor Activity:  Normal  Concentration:  Concentration: Fair  Recall:  [employer]  Fund of Knowledge:  Fair  Language:  Fair  Akathisia:  No  Handed:    AIMS (if indicated):     Assets:  Resilience  ADL's:  Intact  Cognition:  WNL  Sleep:  Number of Hours: 6.5   Treatment Plan Summary: Daily contact with patient to assess and evaluate symptoms and progress in treatment and Medication management  - Will continue today 06/29/2017 plan as below except  where it is noted.  - Second opinion for forced medications last renewed on 06/25/17   -Schizophrenia          - Continue Haldol 5mg  po/IM BID (only give IM if pt refuses PO form)  -Anxiety/Agitation -Continueatarax 25mg  po q8h prn anxiety   - Continue Haldol 5mg  po/IM q6h prn agitation  - HTN -Continuelosartan 100mg  po qDay  - DMII -Continuemetformin 500mg  po BID  - Insomnia -Continuetrazodone 50mg  po qhs prn insomnia  -Encourage participation in groups and the therapeutic milieu  -Discharge planning will be ongoing   Armandina StammerAgnes Nwoko, NP, PMHNP, FNP-BC. 06/29/2017, 4:01 PMPatient ID: Sherri [name], female   DOB: 10-15-1955, 62 y.o.   MRN: [id number]

## 2017-06-29 NOTE — Plan of Care (Signed)
Pt remains delusional.

## 2017-06-29 NOTE — [facility] Group Notes (Signed)
[facility]  Date/Time:  06/29/2017  11:00AM-12:00PM  Type of Therapy and Topic:  Group Therapy:  Music and Mood  Participation Level:  Active   Description of Group: In this process group, members listened to a variety of genres of music and identified that different types of music evoke different responses.  Patients were encouraged to identify music that was soothing for them and music that was energizing for them.  Patients discussed how this knowledge can help with wellness and recovery in various ways including managing depression and anxiety as well as encouraging healthy sleep habits.    Therapeutic Goals: 1. Patients will explore the impact of different varieties of music on mood 2. Patients will verbalize the thoughts they have when listening to different types of music 3. Patients will identify music that is soothing to them as well as music that is energizing to them 4. Patients will discuss how to use this knowledge to assist in maintaining wellness and recovery 5. Patients will explore the use of music as a coping skill  Summary of Patient Progress:  At the beginning of group, patient expressed that she felt focused, and she listened attentively but quietly, then said she still felt focused and now felt awake at the end of group.  Therapeutic Modalities: Solution Focused Brief Therapy Activity   Ambrose MantleMareida Grossman-Orr, LCSW

## 2017-06-30 NOTE — Progress Notes (Signed)
[facility] MD Progress Note  06/30/2017 11:41 AM [facility]  MRN:  [id number] Subjective:    Sherri [name] is a 62 y/o F with history of schizophrenia who was admitted with worening psychosis and paranoia. Pt has a guardian whom is her brother. She has received a second opinion for forced medicationswhich has been renewed multiple times during her admission - most recently on 06/25/17.She remains paranoid, guarded, and uncooperative with the treatment team. SW team has been exploring option of transfer to [facility] inpatient psychiatry unit, but that remains to be an unavailable option at this time. Pt was changed from zyprexa to haldol due to lack of efficacy regarding her presenting symptoms and availability of haldol as a long-acting injectable form. Pt has been accepting of oral haldol only when reminded she will have to take oral form or receive forced injection of haldol. Her symptoms of paranoia and other delusions have persisted.  Upon evaluation today, pt is interviewed in the doorway of the office as she refuses to enter the room with female NP staff present (for pt comfort). Pt shares, "I'm fine." Pt answers a few other basic questions including denying any physical complaints and denying SI/HI/AH/VH, but she is otherwise uncooperative with the interview. She interrupts this provider multiple times and demands immediate discharge, explaining that her uncle has filed a Architect the hospital. Pt continues to address this provider as "Perini" and references to me as a "CNA" whom has been working to keep her in the hospital. Attempted to explain to patient that we are working towards her discharge and re-identified myself as her physician, but pt grew increasingly irritable during interview. Attempted to offer to patient to have phone conference with her brother (guardian) tomorrow so that we could all coordinate the best way to help her, and pt abruptly left the  interview.   Principal Problem: Schizophrenia, disorganized, chronic (HCC) Diagnosis:   Patient Active Problem List   Diagnosis Date Noted  . 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 Schizoaffective disorder, bipolar type (HCC) [F25.0] 05/31/2017  . 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 Bipolar 1 disorder (HCC)   . Hypertension   . Prediabetes   . Schizophrenia (HCC)   . Tachycardia     Past Surgical History:  Procedure Laterality Date  . COLON SURGERY     Family History: History reviewed. No pertinent family history. Family Psychiatric  History: see H&P Social History:  Social History   Substance and Sexual Activity  Alcohol Use No  . Frequency: Never     Social History   Substance and Sexual Activity  Drug Use No    Social History   Socioeconomic History  . Marital status: Divorced    Spouse name: None  . Number of children: None  . Years of education: None  . Highest education level: None  Social Needs  . Financial resource strain: None  . Food insecurity - worry: None  . Food insecurity - inability: None  . Transportation needs - medical: None  . Transportation needs - non-medical: None  Occupational History  . None  Tobacco Use  . Smoking status: Never Smoker  . Smokeless tobacco: Never Used  Substance and Sexual Activity  . Alcohol use: No    Frequency: Never  . Drug use: No  .  Sexual activity: None  Other Topics Concern  . None  Social History Narrative  . None   Additional Social History:                         Sleep: Good  Appetite:  Good  Current Medications: Current Facility-Administered Medications  Medication Dose Route Frequency Provider Last Rate Last Dose  . acetaminophen (TYLENOL) tablet 650 mg  650 mg Oral Q6H  PRN Laveda AbbeParks, Laurie Britton, NP      . alum & mag hydroxide-simeth (MAALOX/MYLANTA) 200-200-20 MG/5ML suspension 30 mL  30 mL Oral Q4H PRN Laveda AbbeParks, Laurie Britton, NP      . calcium-vitamin D (OSCAL WITH D) 500-200 MG-UNIT per tablet 1 tablet  1 tablet Oral Q breakfast Laveda AbbeParks, Laurie Britton, NP   1 tablet at 06/30/17 [id number]  . haloperidol (HALDOL) tablet 5 mg  5 mg Oral BID Jolyne Loaainville, Maryan Sivak T, MD   5 mg at 06/30/17 [id number]   Or  . haloperidol lactate (HALDOL) injection 5 mg  5 mg Intramuscular BID Jolyne Loaainville, Alazay Leicht T, MD      . haloperidol (HALDOL) tablet 5 mg  5 mg Oral Q6H PRN Micheal Likensainville, Tavaras Goody T, MD       Or  . haloperidol lactate (HALDOL) injection 5 mg  5 mg Intramuscular Q6H PRN Micheal Likensainville, Arslan Kier T, MD      . hydrOXYzine (ATARAX/VISTARIL) tablet 25 mg  25 mg Oral Q8H Laveda AbbeParks, Laurie Britton, NP   25 mg at 06/30/17 [id number]  . losartan (COZAAR) tablet 100 mg  100 mg Oral Daily Laveda AbbeParks, Laurie Britton, NP   100 mg at 06/30/17 [id number]  . magnesium hydroxide (MILK OF MAGNESIA) suspension 30 mL  30 mL Oral Daily PRN Laveda AbbeParks, Laurie Britton, NP      . metFORMIN (GLUCOPHAGE) tablet 500 mg  500 mg Oral BID WC Laveda AbbeParks, Laurie Britton, NP   500 mg at 06/30/17 0752  . traZODone (DESYREL) tablet 50 mg  50 mg Oral QHS PRN Micheal Likensainville, Sundus Pete T, MD        Lab Results: No results found for this or any previous visit (from the past 48 hour(s)).  Blood Alcohol level:  Lab Results  Component Value Date   ETH <10 05/30/2017   ETH <10 04/11/2017    Metabolic Disorder Labs: No results found for: HGBA1C, MPG No results found for: PROLACTIN No results found for: CHOL, TRIG, HDL, CHOLHDL, VLDL, LDLCALC  Physical Findings: AIMS: Facial and Oral Movements Muscles of Facial Expression: None, normal Lips and Perioral Area: None, normal Jaw: None, normal Tongue: None, normal,Extremity Movements Upper (arms, wrists, hands, fingers): None, normal Lower (legs, knees, ankles, toes): None, normal, Trunk  Movements Neck, shoulders, hips: None, normal, Overall Severity Severity of abnormal movements (highest score from questions above): None, normal Incapacitation due to abnormal movements: None, normal Patient's awareness of abnormal movements (rate only patient's report): No Awareness, Dental Status Current problems with teeth and/or dentures?: No Does patient usually wear dentures?: No  CIWA:  CIWA-Ar Total: 1 COWS:     Musculoskeletal: Strength & Muscle Tone: within normal limits Gait & Station: normal Patient leans: N/A  Psychiatric Specialty Exam: Physical Exam  Nursing note and vitals reviewed.   Review of Systems  Constitutional: Negative for chills and fever.  Respiratory: Negative for cough.   Cardiovascular: Negative for chest pain.  Gastrointestinal: Negative for abdominal pain, heartburn, nausea and vomiting.  Psychiatric/Behavioral: Negative for depression, hallucinations and suicidal ideas. The patient is  not nervous/anxious.     Blood pressure (!) 164/64, pulse (!) 101, temperature 97.8 F (36.6 C), temperature source Oral, resp. rate 16, height 4' 11.45" (1.51 m), weight 62.1 kg (137 lb), SpO2 97 %.Body mass index is 27.25 kg/m.  General Appearance: Casual and Fairly Groomed  Eye Contact:  Good  Speech:  Clear and Coherent and Normal Rate  Volume:  Normal  Mood:  Dysphoric  Affect:  Blunt  Thought Process:  Disorganized, Goal Directed and Descriptions of Associations: Loose  Orientation:  Full (Time, Place, and Person)  Thought Content:  Illogical, Delusions, Ideas of Reference:   Paranoia Delusions, Paranoid Ideation and Rumination  Suicidal Thoughts:  No  Homicidal Thoughts:  No  Memory:  Immediate;   Fair Recent;   Fair Remote;   Fair  Judgement:  Poor  Insight:  Lacking  Psychomotor Activity:  Normal  Concentration:  Concentration: Fair  Recall:  [employer] of Knowledge:  Fair  Language:  Fair  Akathisia:  No  Handed:    AIMS (if indicated):      Assets:  Manufacturing systems engineer Physical Health Resilience Social Support  ADL's:  Intact  Cognition:  WNL  Sleep:  Number of Hours: 6.75     Treatment Plan Summary: Daily contact with patient to assess and evaluate symptoms and progress in treatment and Medication management   -Continue inpatient hospitalization  - Second opinion for forced medications last renewed on 06/25/17  -Schizophrenia - Continue Haldol 5mg  po/IM BID (only give IM if pt refuses PO form)  -Anxiety/Agitation -Continueatarax 25mg  po q8h prn anxiety - Continue Haldol 5mg  po/IM q6h prn agitation  - HTN -Continuelosartan 100mg  po qDay  - DMII -Continuemetformin 500mg  po BID  - Insomnia -Continuetrazodone 50mg  po qhs prn insomnia  -Encourage participation in groups and the therapeutic milieu  -Discharge planning will be ongoing  Micheal Likens, MD 06/30/2017, 11:41 AM

## 2017-06-30 NOTE — Progress Notes (Signed)
D: Pt presents with an animated affect and anxious mood. Pt delusional and paranoia on approach. Pt stated that she's going to Herbalistsue writer for forcing her to take Haldol. Writer explained to pt order for force meds. Pt then stated that "[place] is not a doctor, he's a Child psychotherapistsocial worker". Pt took Haldol PO without having to force injections. Pt would only allow MHT to check her blood pressure this morning after she witnessed the MHT clean off the Dinam[employer].  Pt mood is labile. Thoughts are disorganized.  A: Medications reviewed with pt. Medications administered as ordered per MD. Verbal support provided. Pt encouraged to attend groups. 15 minute checks performed for safety. R: Pt resistant to tx.

## 2017-06-30 NOTE — Plan of Care (Signed)
Pt attends scheduled groups. No injuries noted. Pt free of injury.

## 2017-06-30 NOTE — Progress Notes (Signed)
CSW spoke with pt legal guardian, Greta Dooml Lepage, to confirm at phone call.  Plan for 11am [place] time.  Al [place] a few days for work in case [facility] at that time. Garner NashGregory Ailany Koren, MSW, LCSW Clinical Social Worker 06/30/2017 12:00 PM

## 2017-06-30 NOTE — Progress Notes (Signed)
Recreation Therapy Notes  Date: 06/30/17 Time: 0955 Location: [address] Dayroom  Group Topic: Coping Skills  Goal Area(s) Addresses:  Patient will be able to identify positive coping skills. Patient will be able to benefits of using coping skills post d/c.  Behavioral Response: Engaged  Intervention: AT[employer]TWhite board, dry erase marker, worksheet  Activity: Mind map.  LRT and patients filled in the eight boxes together with anxiety, anger, overwhelmed, exhausted, lack of patience, sickness, loss of loved one and relationships.  Patients were to then come up with at least 3 coping skills for each situation.  The group would then reconvene and LRT would write the coping skills on the board.   Education: PharmacologistCoping Skills, Building control surveyorDischarge Planning.   Education Outcome: Acknowledges understanding/In group clarification offered/Needs additional education.   Clinical Observations/Feedback:  Pt was bright and pleasant.  Pt stated some of her coping skills were "retail therapy, eat something I like, family support and faith".     Caroll RancherMarjette Jkai Arwood, [name]     Lillia AbedLindsay, Devarius Nelles A 06/30/2017 1:11 PM

## 2017-06-30 NOTE — [facility] Group Notes (Signed)
[facility] LCSW Group Therapy Note  Date/Time: 06/30/17, 1315  Type of Therapy and Topic:  Group Therapy:  Overcoming Obstacles  Participation Level:  Did not attend  Description of Group:    In this group patients will be encouraged to explore what they see as obstacles to their own wellness and recovery. They will be guided to discuss their thoughts, feelings, and behaviors related to these obstacles. The group will process together ways to cope with barriers, with attention given to specific choices patients can make. Each patient will be challenged to identify changes they are motivated to make in order to overcome their obstacles. This group will be process-oriented, with patients participating in exploration of their own experiences as well as giving and receiving support and challenge from other group members.  Therapeutic Goals: 1. Patient will identify personal and current obstacles as they relate to admission. 2. Patient will identify barriers that currently interfere with their wellness or overcoming obstacles.  3. Patient will identify feelings, thought process and behaviors related to these barriers. 4. Patient will identify two changes they are willing to make to overcome these obstacles:    Summary of Patient Progress      Therapeutic Modalities:   Cognitive Behavioral Therapy Solution Focused Therapy Motivational Interviewing Relapse Prevention Therapy  Greg Geary Rufo, LCSW 

## 2017-06-30 NOTE — Progress Notes (Signed)
D: When asked about her day pt stated, "My uncle is working on a [employer] against the hospital.Writer attempted to redirect the pt by asking again. Pt stated, "fine" and walked away. Pt has no questions or concerns.    A:  Support and encouragement was offered. 15 min checks continued for safety.  R: Pt remains safe.

## 2017-07-01 MED ORDER — HALOPERIDOL 5 MG PO TABS
10.0000 mg | ORAL_TABLET | Freq: Every day | ORAL | Status: DC
  Administered 2017-07-01 – 2017-07-15 (×15): 10 mg via ORAL
  Filled 2017-07-01 (×19): qty 2

## 2017-07-01 MED ORDER — HALOPERIDOL LACTATE 5 MG/ML IJ SOLN
10.0000 mg | Freq: Every day | INTRAMUSCULAR | Status: DC
Start: 2017-07-01 — End: 2017-07-07
  Filled 2017-07-01 (×8): qty 2

## 2017-07-01 MED ORDER — HALOPERIDOL 5 MG PO TABS
5.0000 mg | ORAL_TABLET | ORAL | Status: DC
  Administered 2017-07-02 – 2017-07-16 (×15): 5 mg via ORAL
  Filled 2017-07-01 (×8): qty 1
  Filled 2017-07-01: qty 21
  Filled 2017-07-01 (×5): qty 1
  Filled 2017-07-01: qty 21
  Filled 2017-07-01 (×4): qty 1

## 2017-07-01 MED ORDER — HALOPERIDOL LACTATE 5 MG/ML IJ SOLN
5.0000 mg | INTRAMUSCULAR | Status: DC
  Filled 2017-07-01 (×8): qty 1

## 2017-07-01 NOTE — Progress Notes (Signed)
DAR NOTE: Patient presents with anxious affect and mood.  Remained disorganized, paranoid and delusional.  Denies pain, auditory and visual hallucinations.  Rates depression at 0, hopelessness at 0, and anxiety at 0.  Maintained on routine safety checks.  Medications given as prescribed.  Support and encouragement offered as needed. Patient pacing the hallway and observed mumbling to herself. Offered no complaint.

## 2017-07-01 NOTE — Progress Notes (Signed)
Recreation Therapy Notes  Date: 07/01/17 Time: 1000 Location: 500 Hall Dayroom  Group Topic: Self-Esteem  Goal Area(s) Addresses:  Patient will successfully identify positive attributes about themselves.  Patient will successfully identify benefit of improved self-esteem.   Intervention: Magazines, markers, colored pencils, construction paper, scissors, glue sticks  Activity: Collage about Me.  Patients were to create a collage that emphasized the positive qualities they posses, goals they hope to accomplish or what is important to them.  Education:  Self-Esteem, Building control surveyorDischarge Planning.   Education Outcome: Acknowledges education/In group clarification offered/Needs additional education  Clinical Observations/Feedback: Pt did not attend group.    Caroll RancherMarjette Jovani Colquhoun, [name]         Caroll RancherLindsay, Analleli Gierke A 07/01/2017 11:47 AM

## 2017-07-01 NOTE — Progress Notes (Signed)
Did not attend group 

## 2017-07-01 NOTE — [facility] Group Notes (Signed)
LCSW Group Therapy Notes 07/01/2017 1:15pm Type of Therapy and Topic:  Group Therapy:  Communication Participation Level:  Did Not Attend  Description of Group: Patients will identify how individuals communicate with one another appropriately and inappropriately.  Patients will be guided to discuss their thoughts, feelings and behaviors related to barriers when communicating.  The group will process together ways to execute positive and appropriate communication with attention given to how one uses behavior, tone and body language.  Patients will be encouraged to reflect on a situation where they were successfully able to communicate and what made this example successful.  Group will identify specific changes they are motivated to make in order to overcome communication barriers with self, peers, authority, and parents.  This group will be process-oriented with patients participating in exploration of their own experiences, giving and receiving support, and challenging self and other group members.   Therapeutic Goals 1. Patient will identify how people communicate (body language, facial expression, and electronics).  Group will also discuss tone, voice and how these impact what is communicated and what is received. 2. Patient will identify feelings (such as fear or worry), thought process and behaviors related to why people internalize feelings rather than express self openly. 3. Patient will identify two changes they are willing to make to overcome communication barriers 4. Members will then practice through role play how to communicate using I statements, I feel statements, and acknowledging feelings rather than displacing feelings on others Summary of Patient Progress:   Therapeutic Modalities Cognitive Behavioral Therapy Motivational Interviewing Solution Focused Therapy  Sherri RogueRodney B Kaylynne Andres, LCSW 07/01/2017 1:20 PM

## 2017-07-01 NOTE — Progress Notes (Signed)
[facility] MD Progress Note  07/01/2017 12:33 PM MCKINZI ERIKSEN  MRN:  [id number] Subjective:    Sherri [name] is a 62 y/o F with history of schizophrenia who was admitted with worening psychosis and paranoia. Pt has a guardian whom is her brother. She has received a second opinion for forced medicationswhich has been renewed multiple times during her admission - most recently on 06/25/17.She remains paranoid, guarded, and uncooperative with the treatment team. SW team has been exploring option of transfer to [facility] inpatient psychiatry unit, but thatremains to bean unavailable option at this time. Pt was changed from zyprexa to haldol due to lack of efficacy regarding her presenting symptoms and availability of haldol as a long-acting injectable form. Pt has been accepting of oral haldol only when reminded she will have to take oral form or receive forced injection of haldol. Her symptoms of paranoia and other delusions have persisted.  Upon evaluation today, pt's brother (guardian) was contacted via telephone and conference call was started in the office with RN staff present and then this provider later attempted to join the conversation in hopes of facilitating communication between treatment team, patient, and guardian. However, pt remained pressured, disorganized, and paranoid. Upon this provider joining the conversation, pt immediately stated, "That's it, I'm done," and she walked out of the interview room. Pt's brother provides collateral that pt was demanding to be discharged and was not able to speak rationally about how he has been attempting to help her during her hospitalization. As pt refused to participate in the interview due to paranoia, interview was concluded. We will plan to increase her evening dose of haldol tonight. Pt's brother reports he is planning to come to [facility] area on 3/19 for family conference if pt is still admitted at that time. SW team continues to explore  option of transfer to [place], if available.  Principal Problem: Schizophrenia, disorganized, chronic (HCC) Diagnosis:   Patient Active Problem List   Diagnosis Date Noted  . 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 Schizoaffective disorder, bipolar type (HCC) [F25.0] 05/31/2017  . 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 Bipolar 1 disorder (HCC)   . Hypertension   . Prediabetes   . Schizophrenia (HCC)   . Tachycardia     Past Surgical History:  Procedure Laterality Date  . COLON SURGERY     Family History: History reviewed. No pertinent family history. Family Psychiatric  History: see H&P Social History:  Social History   Substance and Sexual Activity  Alcohol Use No  . Frequency: Never     Social History   Substance and Sexual Activity  Drug Use No    Social History   Socioeconomic History  . Marital status: Divorced    Spouse name: None  . Number of children: None  . Years of education: None  . Highest education level: None  Social Needs  . Financial resource strain: None  . Food insecurity - worry: None  . Food insecurity - inability: None  . Transportation needs - medical: None  . Transportation needs - non-medical: None  Occupational History  . None  Tobacco Use  . Smoking status: Never Smoker  . Smokeless tobacco: Never Used  Substance and Sexual Activity  . Alcohol use: No    Frequency: Never  . Drug use: No  .  Sexual activity: None  Other Topics Concern  . None  Social History Narrative  . None   Additional Social History:                         Sleep: Fair  Appetite:  Good  Current Medications: Current Facility-Administered Medications  Medication Dose Route Frequency Provider Last Rate Last Dose  . acetaminophen (TYLENOL)  tablet 650 mg  650 mg Oral Q6H PRN Laveda AbbeParks, Laurie Britton, NP      . alum & mag hydroxide-simeth (MAALOX/MYLANTA) 200-200-20 MG/5ML suspension 30 mL  30 mL Oral Q4H PRN Laveda AbbeParks, Laurie Britton, NP      . calcium-vitamin D (OSCAL WITH D) 500-200 MG-UNIT per tablet 1 tablet  1 tablet Oral Q breakfast Laveda AbbeParks, Laurie Britton, NP   1 tablet at 07/01/17 [id number]  . haloperidol (HALDOL) tablet 5 mg  5 mg Oral BID Micheal Likensainville, Hazen Brumett T, MD   5 mg at 07/01/17 [id number]   Or  . haloperidol lactate (HALDOL) injection 5 mg  5 mg Intramuscular BID Jolyne Loaainville, Lashonna Rieke T, MD      . haloperidol (HALDOL) tablet 5 mg  5 mg Oral Q6H PRN Micheal Likensainville, Revere Maahs T, MD       Or  . haloperidol lactate (HALDOL) injection 5 mg  5 mg Intramuscular Q6H PRN Micheal Likensainville, Tilton Marsalis T, MD      . hydrOXYzine (ATARAX/VISTARIL) tablet 25 mg  25 mg Oral Q8H Laveda AbbeParks, Laurie Britton, NP   25 mg at 07/01/17 [phone]  . losartan (COZAAR) tablet 100 mg  100 mg Oral Daily Laveda AbbeParks, Laurie Britton, NP   100 mg at 07/01/17 0743  . magnesium hydroxide (MILK OF MAGNESIA) suspension 30 mL  30 mL Oral Daily PRN Laveda AbbeParks, Laurie Britton, NP      . metFORMIN (GLUCOPHAGE) tablet 500 mg  500 mg Oral BID WC Laveda AbbeParks, Laurie Britton, NP   500 mg at 07/01/17 0743  . traZODone (DESYREL) tablet 50 mg  50 mg Oral QHS PRN Micheal Likensainville, Nadeen Shipman T, MD        Lab Results: No results found for this or any previous visit (from the past 48 hour(s)).  Blood Alcohol level:  Lab Results  Component Value Date   ETH <10 05/30/2017   ETH <10 04/11/2017    Metabolic Disorder Labs: No results found for: HGBA1C, MPG No results found for: PROLACTIN No results found for: CHOL, TRIG, HDL, CHOLHDL, VLDL, LDLCALC  Physical Findings: AIMS: Facial and Oral Movements Muscles of Facial Expression: None, normal Lips and Perioral Area: None, normal Jaw: None, normal Tongue: None, normal,Extremity Movements Upper (arms, wrists, hands, fingers): None, normal Lower (legs, knees, ankles,  toes): None, normal, Trunk Movements Neck, shoulders, hips: None, normal, Overall Severity Severity of abnormal movements (highest score from questions above): None, normal Incapacitation due to abnormal movements: None, normal Patient's awareness of abnormal movements (rate only patient's report): No Awareness, Dental Status Current problems with teeth and/or dentures?: No Does patient usually wear dentures?: No  CIWA:  CIWA-Ar Total: 1 COWS:     Musculoskeletal: Strength & Muscle Tone: within normal limits Gait & Station: normal Patient leans: N/A  Psychiatric Specialty Exam: Physical Exam  Nursing note and vitals reviewed.   Review of Systems  Constitutional: Negative for chills and fever.  Respiratory: Negative for cough.   Cardiovascular: Negative for chest pain.  Gastrointestinal: Negative for abdominal pain, heartburn, nausea and vomiting.  Psychiatric/Behavioral: Negative for depression, hallucinations, substance abuse and suicidal ideas.  Blood pressure (!) 164/64, pulse (!) 101, temperature 97.8 F (36.6 C), temperature source Oral, resp. rate 16, height 4' 11.45" (1.51 m), weight 62.1 kg (137 lb), SpO2 97 %.Body mass index is 27.25 kg/m.  General Appearance: Casual and Fairly Groomed  Eye Contact:  Good  Speech:  Clear and Coherent and Normal Rate  Volume:  Normal  Mood:  Dysphoric  Affect:  Congruent and Labile  Thought Process:  Disorganized and Descriptions of Associations: Loose  Orientation:  Full (Time, Place, and Person)  Thought Content:  Illogical, Delusions, Ideas of Reference:   Paranoia Delusions and Paranoid Ideation  Suicidal Thoughts:  unable to evaluate  Homicidal Thoughts:  unable to evaluate  Memory:  Immediate;   Good Recent;   Good Remote;   Good  Judgement:  Poor  Insight:  Lacking  Psychomotor Activity:  Normal  Concentration:  Concentration: Fair  Recall:  [employer] of Knowledge:  Fair  Language:  Fair  Akathisia:  No  Handed:     AIMS (if indicated):     Assets:  Resilience  ADL's:  Intact  Cognition:  WNL  Sleep:  Number of Hours: 5.75   Treatment Plan Summary: Daily contact with patient to assess and evaluate symptoms and progress in treatment and Medication management   -Continue inpatient hospitalization  - Second opinion for forced medications last renewed on 06/25/17  -Schizophrenia - Change Haldol 5mg  po/IM BID (only give IM if pt refuses PO form) to haldol 5mg  po/IM qAM + haldol 10mg  po/IM qhs (give IM if pt refuses oral form).   -Anxiety/Agitation -Continueatarax 25mg  po q8h prn anxiety -ContinueHaldol 5mg  po/IM q6h prn agitation  - HTN -Continuelosartan 100mg  po qDay  - DMII -Continuemetformin 500mg  po BID  - Insomnia -Continuetrazodone 50mg  po qhs prn insomnia  -Encourage participation in groups and the therapeutic milieu  -Discharge planning will be ongoing  Micheal Likens, MD 07/01/2017, 12:33 PM

## 2017-07-01 NOTE — Tx Team (Signed)
Interdisciplinary Treatment and Diagnostic Plan Update  07/01/2017 Time of Session: 1:26 PM  Sherri [name] MRN: [id number]  Principal Diagnosis: Schizophrenia, disorganized, chronic (HCC)  Secondary Diagnoses: Principal Problem:   Schizophrenia, disorganized, chronic (HCC)   Current Medications:  Current Facility-Administered Medications  Medication Dose Route Frequency Provider Last Rate Last Dose  . acetaminophen (TYLENOL) tablet 650 mg  650 mg Oral Q6H PRN Laveda Abbe, NP      . alum & mag hydroxide-simeth (MAALOX/MYLANTA) 200-200-20 MG/5ML suspension 30 mL  30 mL Oral Q4H PRN Laveda Abbe, NP      . calcium-vitamin D (OSCAL WITH D) 500-200 MG-UNIT per tablet 1 tablet  1 tablet Oral Q breakfast Laveda Abbe, NP   1 tablet at 07/01/17 7829  . haloperidol (HALDOL) tablet 5 mg  5 mg Oral BID Micheal Likens, MD   5 mg at 07/01/17 5621   Or  . haloperidol lactate (HALDOL) injection 5 mg  5 mg Intramuscular BID Jolyne Loa T, MD      . haloperidol (HALDOL) tablet 5 mg  5 mg Oral Q6H PRN Micheal Likens, MD       Or  . haloperidol lactate (HALDOL) injection 5 mg  5 mg Intramuscular Q6H PRN Micheal Likens, MD      . hydrOXYzine (ATARAX/VISTARIL) tablet 25 mg  25 mg Oral Q8H Laveda Abbe, NP   25 mg at 07/01/17 [phone]  . losartan (COZAAR) tablet 100 mg  100 mg Oral Daily Laveda Abbe, NP   100 mg at 07/01/17 0743  . magnesium hydroxide (MILK OF MAGNESIA) suspension 30 mL  30 mL Oral Daily PRN Laveda Abbe, NP      . metFORMIN (GLUCOPHAGE) tablet 500 mg  500 mg Oral BID WC Laveda Abbe, NP   500 mg at 07/01/17 0743  . traZODone (DESYREL) tablet 50 mg  50 mg Oral QHS PRN Micheal Likens, MD        PTA Medications: Medications Prior to Admission  Medication Sig Dispense Refill Last Dose  . acetaminophen (TYLENOL) 500 MG tablet Take 1,000 mg by mouth every 8 (eight) hours as  needed for mild pain.   unk  . calcium-vitamin D (OSCAL WITH D) 500-200 MG-UNIT tablet Take 1 tablet by mouth daily with breakfast.   Past Week at Unknown time  . chlorhexidine (PERIDEX) 0.12 % solution Use as directed 15 mLs in the mouth or throat 2 (two) times daily.   Past Week at Unknown time  . cholecalciferol (VITAMIN D) 1000 units tablet Take 2,000 Units by mouth daily.   Past Week at Unknown time  . Cinnamon 500 MG capsule Take 500 mg by mouth daily.   Past Week at Unknown time  . Cranberry (CRANBERRY CONCENTRATE) 500 MG CAPS Take 1 capsule by mouth daily.   Past Week at Unknown time  . ferrous sulfate 325 (65 FE) MG tablet Take 325 mg by mouth daily with breakfast.   Past Week at Unknown time  . guaifenesin (HUMIBID E) 400 MG TABS tablet Take 400 mg by mouth every 8 (eight) hours as needed (mucus relief).   unk  . guaiFENesin-dextromethorphan (ROBITUSSIN DM) 100-10 MG/5ML syrup Take 10 mLs by mouth every 4 (four) hours as needed for cough.   unk  . hydrOXYzine (ATARAX/VISTARIL) 25 MG tablet Take 1 tablet (25 mg total) by mouth every 6 (six) hours as needed for anxiety. (Patient taking differently: Take 25 mg by mouth every  8 (eight) hours. ) 30 tablet 0 Past Week at Unknown time  . loratadine (CLARITIN) 10 MG tablet Take 10 mg by mouth daily as needed for allergies.   unk at Unknown time  . losartan (COZAAR) 100 MG tablet Take 100 mg by mouth daily.   Past Week at Unknown time  . metFORMIN (GLUCOPHAGE) 500 MG tablet Take 500 mg by mouth 2 (two) times daily with a meal.   Past Week at Unknown time  . Multiple Vitamin (MULTIVITAMIN WITH MINERALS) TABS tablet Take 1 tablet by mouth daily.   Past Week at Unknown time  . risperiDONE (RISPERDAL) 0.5 MG tablet Take 1 tablet (0.5 mg total) by mouth 2 (two) times daily. 60 tablet 0 Past Week at Unknown time    Treatment Modalities: Medication Management, Group therapy, Case management,  1 to 1 session with clinician, Psychoeducation, Recreational  therapy.  Patient Stressors: Medication change or noncompliance Traumatic event  Patient Strengths: Manufacturing systems engineerCommunication skills Supportive family/friends   Physician Treatment Plan for Primary Diagnosis: Schizophrenia, disorganized, chronic (HCC) Long Term Goal(s): Improvement in symptoms so as ready for discharge  Short Term Goals: Ability to identify and develop effective coping behaviors will improve Compliance with prescribed medications will improve Ability to identify and develop effective coping behaviors will improve  Medication Management: Evaluate patient's response, side effects, and tolerance of medication regimen.  Therapeutic Interventions: 1 to 1 sessions, Unit Group sessions and Medication administration.  Evaluation of Outcomes: Progressing  Physician Treatment Plan for Secondary Diagnosis: Principal Problem:   Schizophrenia, disorganized, chronic (HCC)  Long Term Goal(s): Improvement in symptoms so as ready for discharge  Short Term Goals: Ability to identify and develop effective coping behaviors will improve Compliance with prescribed medications will improve Ability to identify and develop effective coping behaviors will improve  Medication Management: Evaluate patient's response, side effects, and tolerance of medication regimen.  Therapeutic Interventions: 1 to 1 sessions, Unit Group sessions and Medication administration.  Evaluation of Outcomes: Progressing   2/13: Pt remains paranoid, delusional, agitated and aggressive at time. She has poor insight. She has been refusing medications. We will change from Invega to zyprexa (PO or IM if pt refuses PO).   -DiscontinueInvega 6mg  po qDay             - Start zyprexa 5mg  po/IM qAM + 10mg  po/IM qhs (only give IM if pt refuses oral form)  -Anxiety/Agitation -Continuegabapentin 200mg  po BID -Continueatarax 25mg  po q8h prn anxiety -Continueagitation protocol  with zydis/ativan/geodon  - HTN -Continuelosartan 100mg  po qDay  - DMII -Continuemetformin 500mg  po BID  2/21: Pt remains delusional, pressured, labile, and paranoid. She is unwilling to engage with treatment team. We will continue her current medication regimen and continue to investigate possibility of [place]. [facility] due to their level of acuity.  - Second opinion for forced medications last renewed on 2/18 by Dr. Jackquline BerlinIzediuno  -Schizophrenia -Continuezyprexa 5mg  po/IM qAM + 10mg  po/IM qhs (only give IM if pt refuses oral form)  -Anxiety/Agitation -Continuegabapentin 200mg  po BID -Continueatarax 25mg  po q8h prn anxiety -Continueagitation protocol with zydis/ativan/geodon  - HTN -Continuelosartan 100mg  po qDay  - DMII -Continuemetformin 500mg  po BID  2/26: Second opinion for forced medications last renewed on 2/18 by Dr. Jackquline BerlinIzediuno - Pt now accepting of oral zyprexa at bedtime, but we will consider renewal of second opinion if pt refuses long-acting form of antipsychotic  -Schizophrenia -Continuezyprexa 5mg  po/IM qAM + 10mg  po/IM qhs (only give IM  if pt refuses oral form)  -Anxiety/Agitation -Continuegabapentin 200mg  po BID -Continueatarax 25mg  po q8h prn anxiety -Continueagitation protocol with zydis/ativan/geodon  - HTN -Continuelosartan 100mg  po qDay  - DMII -Continuemetformin 500mg  po BID  - Insomnia -Continuetrazodone 50mg  po qhs prn insomnia  2/28: -Schizophrenia             - DC scheduled Invega Sustenna (pt never received Sustenna injection) - Start Haldol 5mg  po/IM BID (only give IM if pt refuses PO form)  -Anxiety/Agitation -Continuegabapentin 200mg  po  BID -Continueatarax 25mg  po q8h prn anxiety -Discontinueagitation protocol with zydis/ativan/geodon            - Start Haldol 5mg  po/IM q6h prn agitation  3/5: pt is interviewed in the doorway of the office as she refuses to enter the room with female NP staff present (for pt comfort). Pt shares, "I'm fine." Pt answers a few other basic questions including denying any physical complaints and denying SI/HI/AH/VH, but she is otherwise uncooperative with the interview. She interrupts this provider multiple times and demands immediate discharge, explaining that her uncle has filed a Architect the hospital. Pt continues to address this provider as "Perini" and references to me as a "CNA" whom has been working to keep her in the hospital  - Second opinion for forced medications last renewed on 06/25/17  -Schizophrenia - ContinueHaldol 5mg  po/IM BID (only give IM if pt refuses PO form)  -Anxiety/Agitation -Continueatarax 25mg  po q8h prn anxiety -ContinueHaldol 5mg  po/IM q6h prn agitation  - HTN -Continuelosartan 100mg  po qDay  - DMII -Continuemetformin 500mg  po BID          RN Treatment Plan for Primary Diagnosis: Schizophrenia, disorganized, chronic (HCC) Long Term Goal(s): Knowledge of disease and therapeutic regimen to maintain health will improve  Short Term Goals: Ability to participate in decision making will improve, Ability to identify and develop effective coping behaviors will improve and Compliance with prescribed medications will improve  Medication Management: RN will administer medications as ordered by provider, will assess and evaluate patient's response and provide education to patient for prescribed medication. RN will report any adverse and/or side effects to prescribing provider.  Therapeutic Interventions: 1 on 1 counseling sessions, Psychoeducation,  Medication administration, Evaluate responses to treatment, Monitor vital signs and CBGs as ordered, Perform/monitor CIWA, COWS, AIMS and Fall Risk screenings as ordered, Perform wound care treatments as ordered.  Evaluation of Outcomes: Progressing   LCSW Treatment Plan for Primary Diagnosis: Schizophrenia, disorganized, chronic (HCC) Long Term Goal(s): Safe transition to appropriate next level of care at discharge, Engage patient in therapeutic group addressing interpersonal concerns.  Short Term Goals: Engage patient in aftercare planning with referrals and resources, Facilitate acceptance of mental health diagnosis and concerns, Identify triggers associated with mental health/substance abuse issues and Increase skills for wellness and recovery  Therapeutic Interventions: Assess for all discharge needs, 1 to 1 time with Social worker, Explore available resources and support systems, Assess for adequacy in community support network, Educate family and significant other(s) on suicide prevention, Complete Psychosocial Assessment, Interpersonal group therapy.  Evaluation of Outcomes: Progressing   Progress in Treatment: Attending groups: No Participating in groups: No Taking medication as prescribed: Yes Toleration of medication: Yes, no side effects reported at this time Family/Significant other contact made: Aaron Edelman [phone] (Brother/Legal Guardian) Patient understands diagnosis: No, limited insight Discussing patient identified problems/goals with staff: Yes Medical problems stabilized or resolved: Yes Denies suicidal/homicidal ideation: Yes Issues/concerns per patient self-inventory: None Other: N/A  New problem(s) identified: None identified at this time.   New Short Term/Long Term Goal(s): "I'm not supposed to be here.  I am supposed to be evaluated for a medication adjustment at the [place]".   Discharge Plan or Barriers: Upon discharge pt will return to [facility] and will follow up at [facility] and with the [place].   3/8:  2nd IVC hearing today-asked for an additional 21 days [again].  Pt has been rejected at [facility] twice. Called today and they are now on diversion, so not taking any new referrals  Reason for Continuation of Hospitalization: Delusions  Hallucinations Medication stabilization   Estimated Length of Stay: 07/04/17  Attendees: Patient:  07/01/2017  1:26 PM  Physician: Jolyne Loa, MD 07/01/2017  1:26 PM  Nursing: Estella Husk, RN 07/01/2017  1:26 PM  RN Care Manager: Onnie Boer, RN 07/01/2017  1:26 PM  Social Worker: Richelle Ito, LCSW; Melba Coon, Social Work Intern 07/01/2017  1:26 PM  Recreational Therapist: [name], LRT 07/01/2017  1:26 PM  Other: Tomasita Morrow, P4CC 07/01/2017  1:26 PM  Other:  07/01/2017  1:26 PM  Other: 07/01/2017  1:26 PM    Scribe for Treatment Team: Ida Rogue, LCSW 07/01/2017 1:26 PM

## 2017-07-01 NOTE — Progress Notes (Signed)
D   Pt has been in her room during the beginning of the shift     Pt was presented with her medications and started to resist taking them but then took the pill cup and then began to try to distract staff away from watching her take her medication    She made several attempts to do so , using different distractions    She finally did take her medication and staff stayed to talk with her a while after she swallowed to make sure she wasn't cheeking   Pt was not as irritable but continues to talk about a lawsuit her brother has against this hospital system   A   Verbal support given   Redirect as needed    Medications administered and effectiveness monitored   Q 15 min checks R    Pt is safe at present time

## 2017-07-02 NOTE — Progress Notes (Signed)
Pt refused CBG this morning

## 2017-07-02 NOTE — BHH Counselor (Signed)
[facility] [name] [place] is [facility] diversion [facility] today, as they were yesterday as well.

## 2017-07-02 NOTE — Progress Notes (Addendum)
D:  Patient's self inventory sheet, patient has fair sleep, no sleep medication.  Patient has fair appetite, normal energy level, good concentration.  Ptient deneid depression, anxiety, hopeless.  Denied withdrawals.  Legal counsel Veda Canninghomasd Scholl, my uncle,  My name is Imelda PillowMichelle LePage, Verl BlalockMichelle Quizhpi, Greta DoomAl LePage, brother, called yesterday.  Tomma LightningFrankie, release me, he signed felony charges.  Return to hospital transportation, I've been here 29 days 19 days over the limit. 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  Emotional support and encouragement given patient. R:  Safety maintained with 15 minute checks per MD order.   Patient went to dining room for dinner.

## 2017-07-02 NOTE — Progress Notes (Signed)
Patient refused metformin 500 mg at 1700.  Patient stated this is not what the military has scheduled for her.  Patient went to dining room for dinner.

## 2017-07-02 NOTE — Progress Notes (Signed)
[facility] MD Progress Note  07/02/2017 2:41 PM Sherri [name]  MRN:  [id number]  Subjective: Sherri [name] reports, "I'm the same as everyday that I have asked to be released from this hospital. My uncle tommy Salley Slaughter is demanding my immediate release. I'm doing okay except that I need to be released today or tomorrow to go to [place]. I expect to be driven home on a hospital transportation. That is how I got here".   Sherri [name] is a 62 y/o F with history of schizophrenia who was admitted with worening psychosis and paranoia. Pt has a guardian whom is her brother. She has received a second opinion for forced medicationswhich has been renewed multiple times during her admission - most recently on 06/25/17.She remains paranoid, guarded, and uncooperative with the treatment team. SW team has been exploring option of transfer to [facility] inpatient psychiatry unit, but thatremains to bean unavailable option at this time. Pt was changed from zyprexa to haldol due to lack of efficacy regarding her presenting symptoms and availability of haldol as a long-acting injectable form. Pt has been accepting of oral haldol only when reminded she will have to take oral form or receive forced injection of haldol. Her symptoms of paranoia and other delusions have persisted.  Upon evaluation yesterday by the attending psychiatrist, pt's brother (guardian) was contacted via telephone and conference call was started in the office with RN staff present and then the attending psychiatrist later attempted to join the conversation in hopes of facilitating communication between treatment team, patient, and guardian. However, pt remained pressured, disorganized, and paranoid. Upon attending provider joining the conversation, pt immediately stated, "That's it, I'm done," and she walked out of the interview room. Pt's brother provides collateral that pt was demanding to be discharged and was not able to speak rationally about  how he has been attempting to help her during her hospitalization. As pt refused to participate in the interview due to paranoia, interview was concluded. Her evening dose of haldol was increased last night. Pt's brother reports he is planning to come to [facility] area on 3/19 for family conference if pt is still admitted at that time. SW team continues to explore option of transfer to [place], if available.  Principal Problem: Schizophrenia, disorganized, chronic (HCC) Diagnosis:   Patient Active Problem List   Diagnosis Date Noted  . 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 Schizoaffective disorder, bipolar type (HCC) [F25.0] 05/31/2017  . 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 Bipolar 1 disorder (HCC)   . Hypertension   . Prediabetes   . Schizophrenia (HCC)   . Tachycardia     Past Surgical History:  Procedure Laterality Date  . COLON SURGERY     Family History: History reviewed. No pertinent family history. Family Psychiatric  History: see H&P Social History:  Social History   Substance and Sexual Activity  Alcohol Use No  . Frequency: Never     Social History   Substance and Sexual Activity  Drug Use No    Social History   Socioeconomic History  . Marital status: Divorced    Spouse name: None  . Number of children: None  . Years of education: None  . Highest education level: None  Social Needs  . Financial resource strain: None  . Food insecurity - worry:  None  . Food insecurity - inability: None  . Transportation needs - medical: None  . Transportation needs - non-medical: None  Occupational History  . None  Tobacco Use  . Smoking status: Never Smoker  . Smokeless tobacco: Never Used  Substance and Sexual Activity  . Alcohol use: No    Frequency: Never  .  Drug use: No  . Sexual activity: None  Other Topics Concern  . None  Social History Narrative  . None   Additional Social History:   Sleep: Fair  Appetite:  Good  Current Medications: Current Facility-Administered Medications  Medication Dose Route Frequency Provider Last Rate Last Dose  . acetaminophen (TYLENOL) tablet 650 mg  650 mg Oral Q6H PRN Laveda AbbeParks, Laurie Britton, NP      . alum & mag hydroxide-simeth (MAALOX/MYLANTA) 200-200-20 MG/5ML suspension 30 mL  30 mL Oral Q4H PRN Laveda AbbeParks, Laurie Britton, NP      . calcium-vitamin D (OSCAL WITH D) 500-200 MG-UNIT per tablet 1 tablet  1 tablet Oral Q breakfast Laveda AbbeParks, Laurie Britton, NP   1 tablet at 07/02/17 [id number]  . haloperidol (HALDOL) tablet 10 mg  10 mg Oral QHS Micheal Likensainville, Christopher T, MD   10 mg at 07/01/17 2048   Or  . haloperidol lactate (HALDOL) injection 10 mg  10 mg Intramuscular QHS Jolyne Loaainville, Christopher T, MD      . haloperidol (HALDOL) tablet 5 mg  5 mg Oral Q6H PRN Micheal Likensainville, Christopher T, MD       Or  . haloperidol lactate (HALDOL) injection 5 mg  5 mg Intramuscular Q6H PRN Micheal Likensainville, Christopher T, MD      . haloperidol (HALDOL) tablet 5 mg  5 mg Oral Veatrice KellsBH-q7a [place], Christopher T, MD   5 mg at 07/02/17 [id number]   Or  . haloperidol lactate (HALDOL) injection 5 mg  5 mg Intramuscular Billie LadeBH-q7a [place], Christopher T, MD      . hydrOXYzine (ATARAX/VISTARIL) tablet 25 mg  25 mg Oral Q8H Laveda AbbeParks, Laurie Britton, NP   25 mg at 07/02/17 1426  . losartan (COZAAR) tablet 100 mg  100 mg Oral Daily Laveda AbbeParks, Laurie Britton, NP   100 mg at 07/02/17 [id number]  . magnesium hydroxide (MILK OF MAGNESIA) suspension 30 mL  30 mL Oral Daily PRN Laveda AbbeParks, Laurie Britton, NP      . metFORMIN (GLUCOPHAGE) tablet 500 mg  500 mg Oral BID WC Laveda AbbeParks, Laurie Britton, NP   500 mg at 07/02/17 0752  . traZODone (DESYREL) tablet 50 mg  50 mg Oral QHS PRN Micheal Likensainville, Christopher T, MD       Lab Results: No results found for this or any previous visit (from the past 48  hour(s)).  Blood Alcohol level:  Lab Results  Component Value Date   ETH <10 05/30/2017   ETH <10 04/11/2017    Metabolic Disorder Labs: No results found for: HGBA1C, MPG No results found for: PROLACTIN No results found for: CHOL, TRIG, HDL, CHOLHDL, VLDL, LDLCALC  Physical Findings: AIMS: Facial and Oral Movements Muscles of Facial Expression: None, normal Lips and Perioral Area: None, normal Jaw: None, normal Tongue: None, normal,Extremity Movements Upper (arms, wrists, hands, fingers): None, normal Lower (legs, knees, ankles, toes): None, normal, Trunk Movements Neck, shoulders, hips: None, normal, Overall Severity Severity of abnormal movements (highest score from questions above): None, normal Incapacitation due to abnormal movements: None, normal Patient's awareness of abnormal movements (rate only patient's report): No Awareness, Dental Status Current problems with teeth and/or dentures?: No Does  patient usually wear dentures?: No  CIWA:  CIWA-Ar Total: 1 COWS:     Musculoskeletal: Strength & Muscle Tone: within normal limits Gait & Station: normal Patient leans: N/A  Psychiatric Specialty Exam: Physical Exam  Nursing note and vitals reviewed.   Review of Systems  Constitutional: Negative for chills and fever.  Respiratory: Negative for cough.   Cardiovascular: Negative for chest pain.  Gastrointestinal: Negative for abdominal pain, heartburn, nausea and vomiting.  Psychiatric/Behavioral: Negative for depression, hallucinations, substance abuse and suicidal ideas.    Blood pressure (!) 164/64, pulse (!) 101, temperature 97.8 F (36.6 C), temperature source Oral, resp. rate 16, height 4' 11.45" (1.51 m), weight 62.1 kg (137 lb), SpO2 97 %.Body mass index is 27.25 kg/m.  General Appearance: Casual and Fairly Groomed  Eye Contact:  Good  Speech:  Clear and Coherent and Normal Rate  Volume:  Normal  Mood:  Dysphoric  Affect:  Congruent and Labile  Thought  Process:  Disorganized and Descriptions of Associations: Loose  Orientation:  Full (Time, Place, and Person)  Thought Content:  Illogical, Delusions, Ideas of Reference:   Paranoia Delusions and Paranoid Ideation  Suicidal Thoughts:  unable to evaluate  Homicidal Thoughts:  unable to evaluate  Memory:  Immediate;   Good Recent;   Good Remote;   Good  Judgement:  Poor  Insight:  Lacking  Psychomotor Activity:  Normal  Concentration:  Concentration: Fair  Recall:  [employer] of Knowledge:  Fair  Language:  Fair  Akathisia:  No  Handed:    AIMS (if indicated):     Assets:  Resilience  ADL's:  Intact  Cognition:  WNL  Sleep:  Number of Hours: 6.75   Treatment Plan Summary: Daily contact with patient to assess and evaluate symptoms and progress in treatment and Medication management   -Continue inpatient hospitalization.  - Will continue today 07/02/2017 plan as below except where it is noted.  - Second opinion for forced medications last renewed on 06/25/17  -Schizophrenia - Continue haldol 5mg  po/IM qAM + haldol 10mg  po/IM qhs (give IM if pt refuses oral form).   -Anxiety/Agitation -Continueatarax 25mg  po q8h prn anxiety -ContinueHaldol 5mg  po/IM q6h prn agitation  - HTN -Continuelosartan 100mg  po qDay  - DMII -Continuemetformin 500mg  po BID  - Insomnia -Continuetrazodone 50mg  po qhs prn insomnia  -Encourage participation in groups and the therapeutic milieu  -Discharge planning will be ongoing  Armandina Stammer, NP, PMHNP, FNP-BC. 07/02/2017, 2:41 PMPatient ID: Sherri [name], female   DOB: 06/01/1955, 62 y.o.   MRN: [id number]

## 2017-07-02 NOTE — Progress Notes (Signed)
Recreation Therapy Notes  Date: 07/02/17 Time: 0950 Location: [address] Dayroom  Group Topic: Wellness  Goal Area(s) Addresses:  Patient will define components of whole wellness. Patient will verbalize benefit of whole wellness.  Intervention: Music, Exercise  Activity: Exercise.  LRT played music while leading patients through a series of exercises.  LRT lead patients through a round of stretches, then allowed each patient to lead an exercise of their choosing such as (squats, jumping jacks, jogging in place, etc).  LRT closed out group by leading patients through a cool down where they completed more stretches.  Education: Wellness, Building control surveyorDischarge Planning.   Education Outcome: Acknowledges education/In group clarification offered/Needs additional education.   Clinical Observations/Feedback:  Pt did not attend group.     Caroll RancherMarjette Berry Gallacher, [name]         Caroll RancherLindsay, Natasa Stigall A 07/02/2017 11:16 AM

## 2017-07-02 NOTE — Plan of Care (Signed)
Nurse discussed medications with patient.   

## 2017-07-02 NOTE — B[facility] Group Notes (Signed)
LCSW Group Therapy Note  07/02/2017 1:15pm  Type of Therapy/Topic:  Group Therapy:  Balance in Life  Participation Level:  Did Not Attend  Description of Group:    This group will address the concept of balance and how it feels and looks when one is unbalanced. Patients will be encouraged to process areas in their lives that are out of balance and identify reasons for remaining unbalanced. Facilitators will guide patients in utilizing problem-solving interventions to address and correct the stressor making their life unbalanced. Understanding and applying boundaries will be explored and addressed for obtaining and maintaining a balanced life. Patients will be encouraged to explore ways to assertively make their unbalanced needs known to significant others in their lives, using other group members and facilitator for support and feedback.  Therapeutic Goals: 1. Patient will identify two or more emotions or situations they have that consume much of in their lives. 2. Patient will identify signs/triggers that life has become out of balance:  3. Patient will identify two ways to set boundaries in order to achieve balance in their lives:  4. Patient will demonstrate ability to communicate their needs through discussion and/or role plays  Summary of Patient Progress:  Found me in hallway after group talking to another patient-interrupted and yelled about being held against her will, that I was not signing the paperwork that would release her, and threats of lawsuits, both current and future.     Therapeutic Modalities:   Cognitive Behavioral Therapy Solution-Focused Therapy Assertiveness Training  Ida RogueRodney B Shandy Vi, KentuckyLCSW 07/02/2017 2:29 PM

## 2017-07-02 NOTE — Progress Notes (Signed)
Nursing Progress Note: 7p-7a D: Pt currently presents with a isolative/remote/anxious affect and behavior. Pt states "I am fine." Not interacting with the milieu. Pt reports fair sleep during the previous night with current medication regimen. Pt did not attend wrap-up group.  A: Pt provided with medications per providers orders. Pt's labs and vitals were monitored throughout the night. Pt supported emotionally and encouraged to express concerns and questions. Pt educated on medications.  R: Pt's safety ensured with 15 minute and environmental checks. Pt currently denies SI, HI, and AVH. Pt verbally contracts to seek staff if SI,HI, or AVH occurs and to consult with staff before acting on any harmful thoughts. Will continue to monitor.

## 2017-07-02 NOTE — Progress Notes (Signed)
Adult Psychoeducational Group Note  Date:  07/02/2017 Time:  8:39 PM  Group Topic/Focus:  Wrap-Up Group:   The focus of this group is to help patients review their daily goal of treatment and discuss progress on daily workbooks.  Participation Level:  Did Not Attend  Participation Quality:  Did not attend  Affect:  Did not attend  Cognitive:  Lacking  Insight: None  Engagement in Group:  Did not attend  Modes of Intervention:  Did not attend  Additional Comments:  Did not attend  Lyndee HensenGoins, Keyshun Elpers R 07/02/2017, 8:39 PM

## 2017-07-03 NOTE — Progress Notes (Addendum)
[facility] MD Progress Note  07/03/2017 12:53 PM Sherri [name]  MRN:  [id number]  Subjective: Sherri [name] reports, "Are you the one to sign me out of this hospital. I have been in this place x 30 days, it was a 10 days kind of stay. Now, I'm held against my will. I know you got the qualification to sign me out here, you just don't. That [place] guy is not my doctor".  Sherri [name] is a 62 y/o F with history of schizophrenia who was admitted with worening psychosis and paranoia. Pt has a guardian whom is her brother. She has received a second opinion for forced medicationswhich has been renewed multiple times during her admission - most recently on 06/25/17.She remains paranoid, guarded, and uncooperative with the treatment team. SW team has been exploring option of transfer to [facility] inpatient psychiatry unit, but thatremains to bean unavailable option at this time. Pt was changed from zyprexa to haldol due to lack of efficacy regarding her presenting symptoms and availability of haldol as a long-acting injectable form. Pt has been accepting of oral haldol only when reminded she will have to take oral form or receive forced injection of haldol. Her symptoms of paranoia and other delusions have persisted.  Upon evaluation 2 days by the attending psychiatrist, pt's brother (guardian) was contacted via telephone and conference call was started in the office with RN staff present and then the attending psychiatrist later attempted to join the conversation in hopes of facilitating communication between treatment team, patient, and guardian. However, pt remained pressured, disorganized, and paranoid. Upon attending provider joining the conversation, pt immediately stated, "That's it, I'm done," and she walked out of the interview room. Pt's brother provides collateral that pt was demanding to be discharged and was not able to speak rationally about how he has been attempting to help her during her  hospitalization. As pt refused to participate in the interview due to paranoia, interview was concluded. Her evening dose of haldol was increased last night. Pt's brother reports he is planning to come to [facility] area on 3/19 for family conference if pt is still admitted at that time. SW team continues to explore option of transfer to [place], if available.  Principal Problem: Schizophrenia, disorganized, chronic (HCC) Diagnosis:   Patient Active Problem List   Diagnosis Date Noted  . 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 Schizoaffective disorder, bipolar type (HCC) [F25.0] 05/31/2017  . 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 Bipolar 1 disorder (HCC)   . Hypertension   . Prediabetes   . Schizophrenia (HCC)   . Tachycardia     Past Surgical History:  Procedure Laterality Date  . COLON SURGERY     Family History: History reviewed. No pertinent family history.  Family Psychiatric  History: See H&P  Social History:  Social History   Substance and Sexual Activity  Alcohol Use No  . Frequency: Never     Social History   Substance and Sexual Activity  Drug Use No    Social History   Socioeconomic History  . Marital status: Divorced    Spouse name: None  . Number of children: None  . Years of education: None  . Highest education level: None  Social Needs  . Financial resource strain: None  . Food insecurity - worry: None  .  Food insecurity - inability: None  . Transportation needs - medical: None  . Transportation needs - non-medical: None  Occupational History  . None  Tobacco Use  . Smoking status: Never Smoker  . Smokeless tobacco: Never Used  Substance and Sexual Activity  . Alcohol use: No    Frequency: Never  . Drug use: No  . Sexual activity: None  Other  Topics Concern  . None  Social History Narrative  . None   Additional Social History:   Sleep: Fair  Appetite:  Good  Current Medications: Current Facility-Administered Medications  Medication Dose Route Frequency Provider Last Rate Last Dose  . acetaminophen (TYLENOL) tablet 650 mg  650 mg Oral Q6H PRN Laveda Abbe, NP      . alum & mag hydroxide-simeth (MAALOX/MYLANTA) 200-200-20 MG/5ML suspension 30 mL  30 mL Oral Q4H PRN Laveda Abbe, NP      . calcium-vitamin D (OSCAL WITH D) 500-200 MG-UNIT per tablet 1 tablet  1 tablet Oral Q breakfast Laveda Abbe, NP   1 tablet at 07/03/17 0800  . haloperidol (HALDOL) tablet 10 mg  10 mg Oral QHS Jolyne Loa T, MD   10 mg at 07/02/17 2021   Or  . haloperidol lactate (HALDOL) injection 10 mg  10 mg Intramuscular QHS Jolyne Loa T, MD      . haloperidol (HALDOL) tablet 5 mg  5 mg Oral Q6H PRN Micheal Likens, MD       Or  . haloperidol lactate (HALDOL) injection 5 mg  5 mg Intramuscular Q6H PRN Micheal Likens, MD      . haloperidol (HALDOL) tablet 5 mg  5 mg Oral BH-q7a Jolyne Loa T, MD   5 mg at 07/03/17 0800   Or  . haloperidol lactate (HALDOL) injection 5 mg  5 mg Intramuscular Billie Lade T, MD      . hydrOXYzine (ATARAX/VISTARIL) tablet 25 mg  25 mg Oral Q8H Laveda Abbe, NP   25 mg at 07/03/17 0800  . losartan (COZAAR) tablet 100 mg  100 mg Oral Daily Laveda Abbe, NP   100 mg at 07/03/17 0800  . magnesium hydroxide (MILK OF MAGNESIA) suspension 30 mL  30 mL Oral Daily PRN Laveda Abbe, NP      . metFORMIN (GLUCOPHAGE) tablet 500 mg  500 mg Oral BID WC Laveda Abbe, NP   500 mg at 07/03/17 0800  . traZODone (DESYREL) tablet 50 mg  50 mg Oral QHS PRN Micheal Likens, MD       Lab Results: No results found for this or any previous visit (from the past 48 hour(s)).  Blood Alcohol level:  Lab  Results  Component Value Date   ETH <10 05/30/2017   ETH <10 04/11/2017   Metabolic Disorder Labs: No results found for: HGBA1C, MPG No results found for: PROLACTIN No results found for: CHOL, TRIG, HDL, CHOLHDL, VLDL, LDLCALC  Physical Findings: AIMS: Facial and Oral Movements Muscles of Facial Expression: None, normal Lips and Perioral Area: None, normal Jaw: None, normal Tongue: None, normal,Extremity Movements Upper (arms, wrists, hands, fingers): None, normal Lower (legs, knees, ankles, toes): None, normal, Trunk Movements Neck, shoulders, hips: None, normal, Overall Severity Severity of abnormal movements (highest score from questions above): None, normal Incapacitation due to abnormal movements: None, normal Patient's awareness of abnormal movements (rate only patient's report): No Awareness, Dental Status Current problems with teeth and/or dentures?: No Does patient usually wear dentures?:  No  CIWA:  CIWA-Ar Total: 1 COWS:     Musculoskeletal: Strength & Muscle Tone: within normal limits Gait & Station: normal Patient leans: N/A  Psychiatric Specialty Exam: Physical Exam  Nursing note and vitals reviewed.   Review of Systems  Constitutional: Negative for chills and fever.  Respiratory: Negative for cough.   Cardiovascular: Negative for chest pain.  Gastrointestinal: Negative for abdominal pain, heartburn, nausea and vomiting.  Psychiatric/Behavioral: Negative for depression, hallucinations, substance abuse and suicidal ideas.    Blood pressure (!) 164/64, pulse (!) 101, temperature 97.8 F (36.6 C), temperature source Oral, resp. rate 16, height 4' 11.45" (1.51 m), weight 62.1 kg (137 lb), SpO2 97 %.Body mass index is 27.25 kg/m.  General Appearance: Casual and Fairly Groomed  Eye Contact:  Good  Speech:  Clear and Coherent and Normal Rate  Volume:  Normal  Mood:  Dysphoric  Affect:  Congruent and Labile  Thought Process:  Disorganized and Descriptions of  Associations: Loose  Orientation:  Full (Time, Place, and Person)  Thought Content:  Illogical, Delusions, Ideas of Reference:   Paranoia Delusions and Paranoid Ideation  Suicidal Thoughts:  unable to evaluate  Homicidal Thoughts:  unable to evaluate  Memory:  Immediate;   Good Recent;   Good Remote;   Good  Judgement:  Poor  Insight:  Lacking  Psychomotor Activity:  Normal  Concentration:  Concentration: Fair  Recall:  [employer]  Fund of Knowledge:  Fair  Language:  Fair  Akathisia:  No  Handed:    AIMS (if indicated):     Assets:  Resilience  ADL's:  Intact  Cognition:  WNL  Sleep:  Number of Hours: 5.75   Treatment Plan Summary: Daily contact with patient to assess and evaluate symptoms and progress in treatment and Medication management   -Continue inpatient hospitalization.  - Will continue today 07/03/2017 plan as below except where it is noted.  - Second opinion for forced medications last renewed on 06/25/17  -Schizophrenia - Continue haldol 5mg  po/IM qAM + haldol 10mg  po/IM qhs (give IM if pt refuses oral form).   -Anxiety/Agitation -Continueatarax 25mg  po q8h prn anxiety -ContinueHaldol 5mg  po/IM q6h prn agitation  - HTN -Continuelosartan 100mg  po qDay  - DMII -Continuemetformin 500mg  po BID  - Insomnia -Continuetrazodone 50mg  po qhs prn insomnia  -Encourage participation in groups and the therapeutic milieu  -Discharge planning will be ongoing  Armandina StammerAgnes Nwoko, NP, PMHNP, FNP-BC. 07/03/2017, 12:53 PMPatient ID: Darrin LuisMichelle S Mcneff, female   DOB: Mar 30, 1956, 62 y.o.   MRN: [id number] Agree with NP Progress Note

## 2017-07-03 NOTE — Tx Team (Signed)
Interdisciplinary Treatment and Diagnostic Plan Update  07/03/2017 Time of Session: 8:12 AM  DILANA MCPHIE MRN: [id number]  Principal Diagnosis: Schizophrenia, disorganized, chronic (HCC)  Secondary Diagnoses: Principal Problem:   Schizophrenia, disorganized, chronic (HCC)   Current Medications:  Current Facility-Administered Medications  Medication Dose Route Frequency Provider Last Rate Last Dose  . acetaminophen (TYLENOL) tablet 650 mg  650 mg Oral Q6H PRN Laveda Abbe, NP      . alum & mag hydroxide-simeth (MAALOX/MYLANTA) 200-200-20 MG/5ML suspension 30 mL  30 mL Oral Q4H PRN Laveda Abbe, NP      . calcium-vitamin D (OSCAL WITH D) 500-200 MG-UNIT per tablet 1 tablet  1 tablet Oral Q breakfast Laveda Abbe, NP   1 tablet at 07/03/17 0800  . haloperidol (HALDOL) tablet 10 mg  10 mg Oral QHS Jolyne Loa T, MD   10 mg at 07/02/17 2021   Or  . haloperidol lactate (HALDOL) injection 10 mg  10 mg Intramuscular QHS Jolyne Loa T, MD      . haloperidol (HALDOL) tablet 5 mg  5 mg Oral Q6H PRN Micheal Likens, MD       Or  . haloperidol lactate (HALDOL) injection 5 mg  5 mg Intramuscular Q6H PRN Micheal Likens, MD      . haloperidol (HALDOL) tablet 5 mg  5 mg Oral BH-q7a Jolyne Loa T, MD   5 mg at 07/03/17 0800   Or  . haloperidol lactate (HALDOL) injection 5 mg  5 mg Intramuscular Billie Lade T, MD      . hydrOXYzine (ATARAX/VISTARIL) tablet 25 mg  25 mg Oral Q8H Laveda Abbe, NP   25 mg at 07/03/17 0800  . losartan (COZAAR) tablet 100 mg  100 mg Oral Daily Laveda Abbe, NP   100 mg at 07/03/17 0800  . magnesium hydroxide (MILK OF MAGNESIA) suspension 30 mL  30 mL Oral Daily PRN Laveda Abbe, NP      . metFORMIN (GLUCOPHAGE) tablet 500 mg  500 mg Oral BID WC Laveda Abbe, NP   500 mg at 07/03/17 0800  . traZODone (DESYREL) tablet 50 mg  50 mg Oral QHS  PRN Micheal Likens, MD        PTA Medications: Medications Prior to Admission  Medication Sig Dispense Refill Last Dose  . acetaminophen (TYLENOL) 500 MG tablet Take 1,000 mg by mouth every 8 (eight) hours as needed for mild pain.   unk  . calcium-vitamin D (OSCAL WITH D) 500-200 MG-UNIT tablet Take 1 tablet by mouth daily with breakfast.   Past Week at Unknown time  . chlorhexidine (PERIDEX) 0.12 % solution Use as directed 15 mLs in the mouth or throat 2 (two) times daily.   Past Week at Unknown time  . cholecalciferol (VITAMIN D) 1000 units tablet Take 2,000 Units by mouth daily.   Past Week at Unknown time  . Cinnamon 500 MG capsule Take 500 mg by mouth daily.   Past Week at Unknown time  . Cranberry (CRANBERRY CONCENTRATE) 500 MG CAPS Take 1 capsule by mouth daily.   Past Week at Unknown time  . ferrous sulfate 325 (65 FE) MG tablet Take 325 mg by mouth daily with breakfast.   Past Week at Unknown time  . guaifenesin (HUMIBID E) 400 MG TABS tablet Take 400 mg by mouth every 8 (eight) hours as needed (mucus relief).   unk  . guaiFENesin-dextromethorphan (ROBITUSSIN DM) 100-10 MG/5ML syrup Take  10 mLs by mouth every 4 (four) hours as needed for cough.   unk  . hydrOXYzine (ATARAX/VISTARIL) 25 MG tablet Take 1 tablet (25 mg total) by mouth every 6 (six) hours as needed for anxiety. (Patient taking differently: Take 25 mg by mouth every 8 (eight) hours. ) 30 tablet 0 Past Week at Unknown time  . loratadine (CLARITIN) 10 MG tablet Take 10 mg by mouth daily as needed for allergies.   unk at Unknown time  . losartan (COZAAR) 100 MG tablet Take 100 mg by mouth daily.   Past Week at Unknown time  . metFORMIN (GLUCOPHAGE) 500 MG tablet Take 500 mg by mouth 2 (two) times daily with a meal.   Past Week at Unknown time  . Multiple Vitamin (MULTIVITAMIN WITH MINERALS) TABS tablet Take 1 tablet by mouth daily.   Past Week at Unknown time  . risperiDONE (RISPERDAL) 0.5 MG tablet Take 1 tablet (0.5  mg total) by mouth 2 (two) times daily. 60 tablet 0 Past Week at Unknown time    Treatment Modalities: Medication Management, Group therapy, Case management,  1 to 1 session with clinician, Psychoeducation, Recreational therapy.  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  Therapeutic Interventions: 1 to 1 sessions, Unit Group sessions and Medication administration.  Evaluation of Outcomes: Progressing  Physician Treatment Plan for Secondary Diagnosis: Principal Problem:   Schizophrenia, disorganized, chronic (HCC)  Long Term Goal(s): Improvement in symptoms so as ready for discharge  Short Term Goals: Ability to identify and develop effective coping behaviors will improve Compliance with prescribed medications will improve Ability to identify and develop effective coping behaviors will improve  Medication Management: Evaluate patient's response, side effects, and tolerance of medication regimen.  Therapeutic Interventions: 1 to 1 sessions, Unit Group sessions and Medication administration.  Evaluation of Outcomes: Progressing   2/13: Pt remains paranoid, delusional, agitated and aggressive at time. She has poor insight. She has been refusing medications. We will change from Invega to zyprexa (PO or IM if pt refuses PO).   -DiscontinueInvega 6mg  po qDay             - Start zyprexa 5mg  po/IM qAM  + 10mg  po/IM qhs (only give IM if pt refuses oral form)  -Anxiety/Agitation -Continuegabapentin 200mg  po BID -Continueatarax 25mg  po q8h prn anxiety -Continueagitation protocol with zydis/ativan/geodon  - HTN -Continuelosartan 100mg  po qDay  - DMII -Continuemetformin 500mg  po BID  2/21: Pt remains delusional, pressured, labile, and paranoid. She is unwilling to engage with treatment team. We will continue her current medication regimen and continue to investigate possibility of transfer to [place]. Pt was rejected yesterday at [facility] due to their level of acuity.  - Second opinion for forced medications last renewed on 2/18 by Dr. Jackquline Berlin  -Schizophrenia -Continuezyprexa 5mg  po/IM qAM + 10mg  po/IM qhs (only give IM if pt refuses oral form)  -Anxiety/Agitation -Continuegabapentin 200mg  po BID -Continueatarax 25mg  po q8h prn anxiety -Continueagitation protocol with zydis/ativan/geodon  - HTN -Continuelosartan 100mg  po qDay  - DMII -Continuemetformin 500mg  po BID  2/26: Second opinion  for forced medications last renewed on 2/18 by Dr. Jackquline BerlinIzediuno - Pt now accepting of oral zyprexa at bedtime, but we will consider renewal of second opinion if pt refuses long-acting form of antipsychotic  -Schizophrenia -Continuezyprexa 5mg  po/IM qAM + 10mg  po/IM qhs (only give IM if pt refuses oral form)  -Anxiety/Agitation -Continuegabapentin 200mg  po BID -Continueatarax 25mg  po q8h prn anxiety -Continueagitation protocol with zydis/ativan/geodon  - HTN -Continuelosartan 100mg  po qDay  - DMII -Continuemetformin 500mg  po BID  - Insomnia -Continuetrazodone 50mg  po qhs prn insomnia  2/28: -Schizophrenia             - DC scheduled  Invega Sustenna (pt never received Sustenna injection) - Start Haldol 5mg  po/IM BID (only give IM if pt refuses PO form)  -Anxiety/Agitation -Continuegabapentin 200mg  po BID -Continueatarax 25mg  po q8h prn anxiety -Discontinueagitation protocol with zydis/ativan/geodon            - Start Haldol 5mg  po/IM q6h prn agitation  3/5: pt is interviewed in the doorway of the office as she refuses to enter the room with female NP staff present (for pt comfort). Pt shares, "I'm fine." Pt answers a few other basic questions including denying any physical complaints and denying SI/HI/AH/VH, but she is otherwise uncooperative with the interview. She interrupts this provider multiple times and demands immediate discharge, explaining that her uncle has filed a Architectlaw suit against the hospital. Pt continues to address this provider as "Perini" and references to me as a "CNA" whom has been working to keep her in the hospital  - Second opinion for forced medications last renewed on 06/25/17  -Schizophrenia - ContinueHaldol 5mg  po/IM BID (only give IM if pt refuses PO form)  -Anxiety/Agitation -Continueatarax 25mg  po q8h prn anxiety -ContinueHaldol 5mg  po/IM q6h prn agitation  - HTN -Continuelosartan 100mg  po qDay  - DMII -Continuemetformin 500mg  po BID  3/7:  "Are you the one to sign me out of this hospital. I have been in this place x 30 days, it was a 10 days kind of stay. Now, I'm held against my will. I know you got the qualification to sign me out here, you just don't. That [place] guy is not my doctor". - Second opinion for forced medications last renewed on 06/25/17  -Schizophrenia - Continue haldol 5mg  po/IM qAM + haldol 10mg  po/IM qhs (give IM if pt refuses oral form).   -Anxiety/Agitation -Continueatarax 25mg  po q8h prn  anxiety -ContinueHaldol 5mg  po/IM q6h prn agitation      RN Treatment Plan for Primary Diagnosis: Schizophrenia, disorganized, chronic (HCC) Long Term Goal(s): Knowledge of disease and therapeutic regimen to maintain health will improve  Short Term Goals: Ability to participate in decision making will improve, Ability to identify and develop effective coping behaviors will improve and Compliance with prescribed medications will improve  Medication Management: RN will administer medications as ordered by provider, will assess and evaluate patient's response and provide education to patient for prescribed medication. RN will report any adverse and/or side effects to prescribing provider.  Therapeutic Interventions: 1 on 1 counseling sessions, Psychoeducation, Medication administration, Evaluate responses to treatment, Monitor vital signs and CBGs as ordered, Perform/monitor CIWA, COWS, AIMS and Fall Risk screenings as ordered, Perform wound care treatments as ordered.  Evaluation of Outcomes: Progressing   LCSW Treatment Plan for Primary Diagnosis: Schizophrenia, disorganized, chronic (HCC) Long Term Goal(s): Safe transition to appropriate next level of care at discharge, Engage patient in therapeutic group addressing interpersonal concerns.  Short Term Goals: Engage patient  in aftercare planning with referrals and resources, Facilitate acceptance of mental health diagnosis and concerns, Identify triggers associated with mental health/substance abuse issues and Increase skills for wellness and recovery  Therapeutic Interventions: Assess for all discharge needs, 1 to 1 time with Social worker, Explore available resources and support systems, Assess for adequacy in community support network, Educate family and significant other(s) on suicide prevention, Complete Psychosocial Assessment, Interpersonal group therapy.  Evaluation of Outcomes: Progressing   Progress in  Treatment: Attending groups: No Participating in groups: No Taking medication as prescribed: Yes Toleration of medication: Yes, no side effects reported at this time Family/Significant other contact made: Aaron Edelman [phone] (Brother/Legal Guardian) Patient understands diagnosis: No, limited insight Discussing patient identified problems/goals with staff: Yes Medical problems stabilized or resolved: Yes Denies suicidal/homicidal ideation: Yes Issues/concerns per patient self-inventory: None Other: N/A  New problem(s) identified: None identified at this time.   New Short Term/Long Term Goal(s): "I'm not supposed to be here.  I am supposed to be evaluated for a medication adjustment at the [place]".   Discharge Plan or Barriers: Upon discharge pt will return to [facility] and will follow up at [facility] and with the [place].   3/8:  2nd IVC hearing today-asked for an additional 21 days [again].  Pt has been rejected at [facility] twice. Called today and they are now on diversion, so not taking any new referrals  Reason for Continuation of Hospitalization: Delusions  Mood Instability Medication stabilization   Estimated Length of Stay: 07/08/17  Attendees: Patient:  07/03/2017  8:12 AM  Physician: Jolyne Loa, MD 07/03/2017  8:12 AM  Nursing: Estella Husk, RN 07/03/2017  8:12 AM  RN Care Manager: Onnie Boer, RN 07/03/2017  8:12 AM  Social Worker: Richelle Ito, LCSW; Melba Coon, Social Work Intern 07/03/2017  8:12 AM  Recreational Therapist: [name], LRT 07/03/2017  8:12 AM  Other: Tomasita Morrow, P4CC 07/03/2017  8:12 AM  Other:  07/03/2017  8:12 AM  Other: 07/03/2017  8:12 AM    Scribe for Treatment Team: Ida Rogue, LCSW 07/03/2017 8:12 AM

## 2017-07-03 NOTE — Progress Notes (Signed)
Patient did not attend wrap up group. 

## 2017-07-03 NOTE — BHH Counselor (Signed)
[facility] beds [facility].  April is the transfer coordinator.  [phone]

## 2017-07-03 NOTE — [facility] Group Notes (Signed)
[facility]  07/03/2017 , 12:59 PM    Type of Therapy:  Mental [employer] Presentation  Participation Level:  Active  Participation Quality:  Attentive  Affect:  Blunted  Cognitive:  Oriented  Insight:  Limited  Engagement in Therapy:  Engaged  Modes of Intervention:  Discussion, Education and Socialization  Summary of Progress/Problems:  Tammi  from [employer] came to present her recovery story, encourage group  members to share something about their story, and present information about the MHA.   Invited.  Chose to not attend.  Daryel Geraldorth, Garrie Woodin B 07/03/2017 , 12:59 PM

## 2017-07-03 NOTE — Progress Notes (Signed)
Recreation Therapy Notes  Date: 07/03/17 Time: 1000 Location: 500 Hall Dayroom  Group Topic: Communication  Goal Area(s) Addresses:  Patient will effectively communicate with peers in group.  Patient will verbalize benefit of healthy communication. Patient will verbalize positive effect of healthy communication on post d/c goals.  Patient will identify communication techniques that made activity effective for group.   Behavioral Response: Minimal  Intervention: Geometrical designs, pencils, blank paper, chairs  Activity: Back to Back Drawings.  LRT divided patients into pairs of two.  One person was designated the speaker while the other person was the listener.  The speaker was given a picture they were to describe to their partner.  Their partner was to then draw the picture as it was described to them.  The listener was not allowed to ask any questions.  Once done they would compare pictures.  The two would then switch roles and LRT would give them a new picture to draw.  Education: Communication, Discharge Planning  Education Outcome: Acknowledges understanding/In group clarification offered/Needs additional education.   Clinical Observations/Feedback:  Pt stated body language was a part of communication.  Pt was bright.  Pt left early and did not return.     Caroll RancherMarjette Kristinia Leavy, [name]     Lillia AbedLindsay, Marnette Perkins A 07/03/2017 10:53 AM

## 2017-07-03 NOTE — Progress Notes (Signed)
D: When asked about her day pt stated, "my doctor told me he's not a doctor he's a Child psychotherapistsocial worker". Informed the writer that she shouldn't have to take the scheduled meds. Also informed writer that she was should only be taking "one haldol". Writer informed pt that the order was changed by her doctor from 5 mg to 10 mg,  Pt appeared to be irritated and attempted to be sarcastic with the Clinical research associatewriter, asking the writer "how tall she is", and saying "it must be hard for writer to get a husband or boyfriend". While walking away pt yelled to the writer that Clinical research associatewriter "would have been good working in a prison". Pt has no other questions or concerns.    A:  Support and encouragement was offered. 15 min checks continued for safety.  R: Pt remains safe.

## 2017-07-03 NOTE — Progress Notes (Signed)
Patient ID: Sherri [name], female   DOB: [birth year], 62 y.o.   MRN: [id number]  DAR: Pt. Denies SI/HI and A/V Hallucinations.  She refuses to fill out half of her self inventory sheet simply putting N/A. She reports, "this is actually a felony kidnapping." Patient speaks to Clinical research associatewriter in a curt tone. She reports that staff here will personally be drained of their personal bank accounts by her brother. She reports she is given medications like "Haldon" that are not what the military has prescribed. Patient is somewhat loose with associations. Patient does not report any pain or discomfort at this time. Support and encouragement provided to the patient however patient is not receptive to this. Patient is seen in the dayroom this morning watching television and intermittently talking to her peers. Q15 minute checks are maintained for safety.

## 2017-07-03 NOTE — BHH Group Notes (Signed)
B[facility] Group Notes:  (Nursing/MHT/Case Management/Adjunct)  Date:  07/03/2017  Time:  6:29 PM  Type of Therapy:  Psychoeducational Skills  Participation Level:  Did Not Attend  Participation Quality:  DID NOT ATTEND  Affect:  DID NOT ATTEND  Cognitive:  DID NOT ATTEND  Insight:  None  Engagement in Group:  DID NOT ATTEND  Modes of Intervention:  DID NOT ATTEND  Summary of Progress/Problems: Pt did not attend patient self inventory group.    Bethann PunchesJane O Fantasy Donald 07/03/2017, 6:29 PM

## 2017-07-04 NOTE — Progress Notes (Signed)
Pt did not attend Psychoeducational group. 

## 2017-07-04 NOTE — Plan of Care (Signed)
Pt does not participate in activities, isolate in the room most of the time except for meds and meals.

## 2017-07-04 NOTE — Progress Notes (Signed)
Adult Psychoeducational Group Note  Date:  07/04/2017 Time:  9:42 PM  Group Topic/Focus:  Wrap-Up Group:   The focus of this group is to help patients review their daily goal of treatment and discuss progress on daily workbooks.  Participation Level:  Active  Participation Quality:  Appropriate  Affect:  Blunted  Cognitive:  Oriented  Insight: Limited  Engagement in Group:  Engaged  Modes of Intervention:  Socialization and Support  Additional Comments:  Patient attended and participated in group tonight. She reports that she talked about time limits today. She proceeded to talk about something totally off topic. This Clinical research associatewriter redirected her several time however she just continue talking and walked out of the group  Scot DockFrancis, Brittnie Lewey Dacosta 07/04/2017, 9:42 PM

## 2017-07-04 NOTE — Progress Notes (Signed)
Psychoeducational Group Note  Date:  07/04/2017 Time:  1001  Group Topic/Focus:  Goals Group:   The focus of this group is to help patients establish daily goals to achieve during treatment and discuss how the patient can incorporate goal setting into their daily lives to aide in recovery.  Participation Level: Did Not Attend  Participation Quality:  Not Applicable  Affect:  Not Applicable  Cognitive:  Not Applicable  Insight:  Not Applicable  Engagement in Group: Not Applicable  Additional Comments:  Pt did not attend the Goals Group this morning.  Sherri [name] 07/04/2017, 10:01 AM

## 2017-07-04 NOTE — Progress Notes (Signed)
DAR NOTE: Pt present with flat affect and depressed mood in the unit. Pt has been isolating herself and has been bed most of the time. Pt denies physical pain, took all her meds as scheduled, but refused metphomine. As per self inventory, pt had a good night sleep, good appetite, normal energy, and good concentration. Pt rate depression at 0, hopeless ness at 0, and anxiety at 0. Pt's safety ensured with 15 minute and environmental checks. Pt currently denies SI/HI and A/V hallucinations. Pt verbally agrees to seek staff if SI/HI or A/VH occurs and to consult with staff before acting on these thoughts. Will continue POC.

## 2017-07-04 NOTE — Progress Notes (Signed)
Recreation Therapy Notes  Date: 07/04/17 Time: 1000 Location: 500 Hall  Group Topic: Teambuilding  Goal Area(s) Addresses:  Patient will effectively work with peers in group.  Patient will verbalize benefit of teamwork. Patient will verbalize positive effect of teamwork post d/c goals.  Patient will identify techniques that made activity effective for group.   Behavioral Response: None  Intervention: Social workerubber Disc  Activity: [place] in [employer].  Each patient was given a rubber disc and the group as a whole was given one disc.  Patients were to use the discs to [facility] group from the nurse's station to the end of the hall and back to the nurse's station.  Education: Teambuilding, Discharge Planning  Education Outcome: Acknowledges understanding/In group clarification offered/Needs additional education.   Clinical Observations/Feedback: Pt did not participate in activity.  Pt sat and listened for a while during processing before leaving and not returning.    Caroll RancherMarjette Pierrette Scheu, [name]         Lillia AbedLindsay, Edon Hoadley A 07/04/2017 10:58 AM

## 2017-07-04 NOTE — Progress Notes (Signed)
[facility]  07/04/2017 11:27 AM Sherri [name]  MRN:  [id number]  Subjective: Sherri [name] reports, "I'm expecting you to be working on my release today or tomorrow. I all I need after that is a hospital transportation to my home in [place]".  Sherri [name] is a 62 y/o F with history of schizophrenia who was admitted with worening psychosis and paranoia. Pt has a guardian whom is her brother. She has received a second opinion for forced medicationswhich has been renewed multiple times during her admission - most recently on 06/25/17.She remains paranoid, guarded, and uncooperative with the treatment team. SW team has been exploring [facility], but thatremains to bean unavailable option at this time. Pt was changed from zyprexa to haldol due to lack of efficacy regarding her presenting symptoms and availability of haldol as a long-acting injectable form. Pt has been accepting of oral haldol only when reminded she will have to take oral form or receive forced injection of haldol. Her symptoms of paranoia and other delusions have persisted.  Today, Sherri [name] is seen, chart reviewed. She has shown no changes in her symptoms. She remains delusional. Upon evaluation 3 days ago by the attending psychiatrist, pt's brother (guardian) was contacted via telephone and conference call was started in the office with RN staff present and then the attending psychiatrist later attempted to join the conversation in hopes of facilitating communication between treatment team, patient, and guardian. However, pt remained pressured, disorganized, and paranoid. Upon attending provider joining the conversation, pt immediately stated, "That's it, I'm done," and she walked out of the interview room. Pt's brother provides collateral that pt was demanding to be discharged and was not able to speak rationally about how he has been attempting to help her during her  hospitalization. As pt refused to participate in the interview due to paranoia, interview was concluded. Her evening dose of haldol was increased last night. Pt's [place] if pt is still admitted at that time. SW team [facility] to [facility]. No changes made on her current plan of care.  Principal Problem: Schizophrenia, disorganized, chronic (HCC) Diagnosis:   Patient Active Problem List   Diagnosis Date Noted  . 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 Schizoaffective disorder, bipolar type (HCC) [F25.0] 05/31/2017  . 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 Bipolar 1 disorder (HCC)   . Hypertension   . Prediabetes   . Schizophrenia (HCC)   . Tachycardia     Past Surgical History:  Procedure Laterality Date  . COLON SURGERY     Family History: History reviewed. No pertinent family history.  Family Psychiatric  History: See H&P  Social History:  Social History   Substance and Sexual Activity  Alcohol Use No  . Frequency: Never     Social History   Substance and Sexual Activity  Drug Use No    Social History   Socioeconomic History  . Marital status: Divorced    Spouse name: None  . Number of children: None  . Years of education: None  . Highest education level: None  Social Needs  . Financial resource strain: None  . Food insecurity - worry: None  .  Food insecurity - inability: None  . Transportation needs - medical: None  . Transportation needs - non-medical: None  Occupational History  . None  Tobacco Use  . Smoking status: Never Smoker  . Smokeless tobacco: Never Used  Substance and Sexual Activity  . Alcohol use: No    Frequency: Never  .  Drug use: No  . Sexual activity: None  Other Topics Concern  . None  Social History Narrative  . None   Additional Social History:   Sleep: Fair  Appetite:  Good  Current Medications: Current Facility-Administered Medications  Medication Dose Route Frequency Provider Last Rate Last Dose  . acetaminophen (TYLENOL) tablet 650 mg  650 mg Oral Q6H PRN Laveda Abbe, NP      . alum & mag hydroxide-simeth (MAALOX/MYLANTA) 200-200-20 MG/5ML suspension 30 mL  30 mL Oral Q4H PRN Laveda Abbe, NP      . calcium-vitamin D (OSCAL WITH D) 500-200 MG-UNIT per tablet 1 tablet  1 tablet Oral Q breakfast Laveda Abbe, NP   1 tablet at 07/04/17 0820  . haloperidol (HALDOL) tablet 10 mg  10 mg Oral QHS Jolyne Loa T, MD   10 mg at 07/03/17 2213   Or  . haloperidol lactate (HALDOL) injection 10 mg  10 mg Intramuscular QHS Jolyne Loa T, MD      . haloperidol (HALDOL) tablet 5 mg  5 mg Oral Q6H PRN Micheal Likens, MD       Or  . haloperidol lactate (HALDOL) injection 5 mg  5 mg Intramuscular Q6H PRN Micheal Likens, MD      . haloperidol (HALDOL) tablet 5 mg  5 mg Oral Billie Lade T, MD   5 mg at 07/04/17 7425   Or  . haloperidol lactate (HALDOL) injection 5 mg  5 mg Intramuscular Billie Lade T, MD      . hydrOXYzine (ATARAX/VISTARIL) tablet 25 mg  25 mg Oral Q8H Laveda Abbe, NP   25 mg at 07/04/17 9563  . losartan (COZAAR) tablet 100 mg  100 mg Oral Daily Laveda Abbe, NP   100 mg at 07/04/17 0820  . magnesium hydroxide (MILK OF MAGNESIA) suspension 30 mL  30 mL Oral Daily PRN Laveda Abbe, NP      . metFORMIN (GLUCOPHAGE) tablet 500 mg  500 mg Oral BID WC Laveda Abbe, NP   500 mg at 07/04/17 0820  . traZODone (DESYREL) tablet 50 mg  50 mg Oral QHS PRN Micheal Likens, MD       Lab Results: No results found for this or any previous visit (from the past 48  hour(s)).  Blood Alcohol level:  Lab Results  Component Value Date   ETH <10 05/30/2017   ETH <10 04/11/2017   Metabolic Disorder Labs: No results found for: HGBA1C, MPG No results found for: PROLACTIN No results found for: CHOL, TRIG, HDL, CHOLHDL, VLDL, LDLCALC  Physical Findings: AIMS: Facial and Oral Movements Muscles of Facial Expression: None, normal Lips and Perioral Area: None, normal Jaw: None, normal Tongue: None, normal,Extremity Movements Upper (arms, wrists, hands, fingers): None, normal Lower (legs, knees, ankles, toes): None, normal, Trunk Movements Neck, shoulders, hips: None, normal, Overall Severity Severity of abnormal movements (highest score from questions above): None, normal Incapacitation due to abnormal movements: None, normal Patient's awareness of abnormal movements (rate only patient's report): No Awareness, Dental Status Current problems with teeth and/or dentures?: No Does patient usually wear dentures?:  No  CIWA:  CIWA-Ar Total: 1 COWS:     Musculoskeletal: Strength & Muscle Tone: within normal limits Gait & Station: normal Patient leans: N/A  Psychiatric Specialty Exam: Physical Exam  Nursing note and vitals reviewed.   Review of Systems  Constitutional: Negative for chills and fever.  Respiratory: Negative for cough.   Cardiovascular: Negative for chest pain.  Gastrointestinal: Negative for abdominal pain, heartburn, nausea and vomiting.  Psychiatric/Behavioral: Negative for depression, hallucinations, substance abuse and suicidal ideas.    Blood pressure (!) 164/64, pulse (!) 101, temperature 97.8 F (36.6 C), temperature source Oral, resp. rate 16, height 4' 11.45" (1.51 m), weight 62.1 kg (137 lb), SpO2 97 %.Body mass index is 27.25 kg/m.  General Appearance: Casual and Fairly Groomed  Eye Contact:  Good  Speech:  Clear and Coherent and Normal Rate  Volume:  Normal  Mood:  Dysphoric  Affect:  Congruent and Labile  Thought  Process:  Disorganized and Descriptions of Associations: Loose  Orientation:  Full (Time, Place, and Person)  Thought Content:  Illogical, Delusions, Ideas of Reference:   Paranoia Delusions and Paranoid Ideation  Suicidal Thoughts:  unable to evaluate  Homicidal Thoughts:  unable to evaluate  Memory:  Immediate;   Good Recent;   Good Remote;   Good  Judgement:  Poor  Insight:  Lacking  Psychomotor Activity:  Normal  Concentration:  Concentration: Fair  Recall:  [employer] of Knowledge:  Fair  Language:  Fair  Akathisia:  No  Handed:    AIMS (if indicated):     Assets:  Resilience  ADL's:  Intact  Cognition:  WNL  Sleep:  Number of Hours: 6.75   Treatment Plan Summary: Daily contact with patient to assess and evaluate symptoms and progress in treatment and Medication management   -Continue inpatient hospitalization.  - Will continue today 07/04/2017 plan as below except where it is noted.  - Second opinion for forced medications last renewed on 06/25/17  -Schizophrenia - Continue haldol 5mg  po/IM qAM + haldol 10mg  po/IM qhs (give IM if pt refuses oral form).   -Anxiety/Agitation -Continueatarax 25mg  po q8h prn anxiety -ContinueHaldol 5mg  po/IM q6h prn agitation  - HTN -Continuelosartan 100 mg po qDay  - DMII -Continuemetformin 500mg  po BID  - Insomnia -Continuetrazodone 50mg  po qhs prn insomnia  -Encourage participation in groups and the therapeutic milieu  -Discharge planning will be ongoing  Sherri Stammer, NP, PMHNP, FNP-BC. 07/04/2017, 11:27 AMPatient ID: Sherri Luis, female   DOB: 1955/06/18, 62 y.o.   MRN: [id number]

## 2017-07-04 NOTE — [facility] Group Notes (Signed)
[facility] LCSW Group Therapy  07/04/2017  1:05 PM  Type of Therapy:  Group therapy  Participation Level:  Active  Participation Quality:  Attentive  Affect:  Flat  Cognitive:  Oriented  Insight:  Limited  Engagement in Therapy:  Limited  Modes of Intervention:  Discussion, Socialization  Summary of Progress/Problems:  Chaplain was here to lead a group on themes of hope and courage. Invited.  Chose to not attend.  Sherri [name], Liany Mumpower B 07/04/2017 12:52 PM

## 2017-07-04 NOTE — [facility] Counselor (Signed)
Today Sherri [name] is on "capacity alert" and is not receiving referrals.

## 2017-07-05 NOTE — Progress Notes (Addendum)
[facility] MD Progress Note  07/05/2017 11:00 AM Sherri [name]  MRN:  [id number]  Subjective: Mylia reports " I need to leave now, I don't know why I have to stay her any longer."   Objective: Sherri [name] is awake, alert and oriented to self, place and date. Continues to present with delusions and paranoia.   Patent seen resting in bedroom. Noted that patient isolates for most of the day. found attending group session.  Denies suicidal or homicidal ideations. Sherri [name] reports take medications, continues to state that they [facility] not [facility]. Patient is requesting to be discharge today. Patient is observe thought blocking and preoccupied. Support, encouragement and reassurance was provided.     Per the HPI assessment- Sherri [name] is a 62 y/o F with history of schizophrenia who was admitted with worsening psychosis and paranoia. Pt has a guardian whom is her brother. She has received a second opinion for forced medicationswhich has been renewed multiple times during her admission - most recently on 06/25/17.She remains paranoid, guarded, and uncooperative with the treatment team. SW team has been exploring option of transfer [facility] psychiatry unit, but thatremains to bean unavailable option at this time. Pt was changed from Zyprexa to haldol due to lack of efficacy regarding her presenting symptoms and availability of haldol as a long-acting injectable form. Pt has been accepting of oral haldol only when reminded she will have to take oral form or receive forced injection of haldol. Her symptoms of paranoia and other delusions have persisted.   Principal Problem: Schizophrenia, disorganized, chronic (HCC) Diagnosis:   Patient Active Problem List   Diagnosis Date Noted  . 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 Schizoaffective disorder, bipolar type (HCC) [F25.0] 05/31/2017  . Psychosis in elderly, with behavioral  disturbance [F03.91] 04/12/2017   Total Time spent with patient: 15 minutes  Past Psychiatric History: See H&P  Past Medical History:  Past Medical History:  Diagnosis Date  . Bipolar 1 disorder (HCC)   . Hypertension   . Prediabetes   . Schizophrenia (HCC)   . Tachycardia     Past Surgical History:  Procedure Laterality Date  . COLON SURGERY     Family History: History reviewed. No pertinent family history.  Family Psychiatric  History: See H&P  Social History:  Social History   Substance and Sexual Activity  Alcohol Use No  . Frequency: Never     Social History   Substance and Sexual Activity  Drug Use No    Social History   Socioeconomic History  . Marital status: Divorced    Spouse name: None  . Number of children: None  . Years of education: None  . Highest education level: None  Social Needs  . Financial resource strain: None  . Food insecurity - worry: None  . Food insecurity - inability: None  . Transportation needs - medical: None  . Transportation needs - non-medical: None  Occupational History  . None  Tobacco Use  . Smoking status: Never Smoker  . Smokeless tobacco: Never Used  Substance and Sexual Activity  . Alcohol use: No    Frequency: Never  . Drug use: No  . Sexual activity: None  Other Topics Concern  . None  Social History Narrative  . None   Additional Social History:   Sleep: Fair  Appetite:  Good  Current Medications: Current Facility-Administered Medications  Medication Dose Route Frequency Provider Last Rate Last Dose  .  acetaminophen (TYLENOL) tablet 650 mg  650 mg Oral Q6H PRN Laveda Abbe, NP      . alum & mag hydroxide-simeth (MAALOX/MYLANTA) 200-200-20 MG/5ML suspension 30 mL  30 mL Oral Q4H PRN Laveda Abbe, NP      . calcium-vitamin D (OSCAL WITH D) 500-200 MG-UNIT per tablet 1 tablet  1 tablet Oral Q breakfast Laveda Abbe, NP   1 tablet at 07/05/17 1610  . haloperidol (HALDOL)  tablet 10 mg  10 mg Oral QHS Jolyne Loa T, MD   10 mg at 07/04/17 2153   Or  . haloperidol lactate (HALDOL) injection 10 mg  10 mg Intramuscular QHS Jolyne Loa T, MD      . haloperidol (HALDOL) tablet 5 mg  5 mg Oral Q6H PRN Micheal Likens, MD       Or  . haloperidol lactate (HALDOL) injection 5 mg  5 mg Intramuscular Q6H PRN Micheal Likens, MD      . haloperidol (HALDOL) tablet 5 mg  5 mg Oral Veatrice Kells, MD   5 mg at 07/05/17 9604   Or  . haloperidol lactate (HALDOL) injection 5 mg  5 mg Intramuscular Billie Lade T, MD      . hydrOXYzine (ATARAX/VISTARIL) tablet 25 mg  25 mg Oral Q8H Laveda Abbe, NP   25 mg at 07/05/17 5409  . losartan (COZAAR) tablet 100 mg  100 mg Oral Daily Laveda Abbe, NP   100 mg at 07/05/17 8119  . magnesium hydroxide (MILK OF MAGNESIA) suspension 30 mL  30 mL Oral Daily PRN Laveda Abbe, NP      . metFORMIN (GLUCOPHAGE) tablet 500 mg  500 mg Oral BID WC Laveda Abbe, NP   500 mg at 07/05/17 0811  . traZODone (DESYREL) tablet 50 mg  50 mg Oral QHS PRN Micheal Likens, MD       Lab Results: No results found for this or any previous visit (from the past 48 hour(s)).  Blood Alcohol level:  Lab Results  Component Value Date   ETH <10 05/30/2017   ETH <10 04/11/2017   Metabolic Disorder Labs: No results found for: HGBA1C, MPG No results found for: PROLACTIN No results found for: CHOL, TRIG, HDL, CHOLHDL, VLDL, LDLCALC  Physical Findings: AIMS: Facial and Oral Movements Muscles of Facial Expression: None, normal Lips and Perioral Area: None, normal Jaw: None, normal Tongue: None, normal,Extremity Movements Upper (arms, wrists, hands, fingers): None, normal Lower (legs, knees, ankles, toes): None, normal, Trunk Movements Neck, shoulders, hips: None, normal, Overall Severity Severity of abnormal movements (highest score from questions  above): None, normal Incapacitation due to abnormal movements: None, normal Patient's awareness of abnormal movements (rate only patient's report): No Awareness, Dental Status Current problems with teeth and/or dentures?: No Does patient usually wear dentures?: No  CIWA:  CIWA-Ar Total: 1 COWS:     Musculoskeletal: Strength & Muscle Tone: within normal limits Gait & Station: normal Patient leans: N/A  Psychiatric Specialty Exam: Physical Exam  Nursing note and vitals reviewed.   Review of Systems  Constitutional: Negative for chills and fever.  Respiratory: Negative for cough.   Cardiovascular: Negative for chest pain.  Gastrointestinal: Negative for abdominal pain, heartburn, nausea and vomiting.  Psychiatric/Behavioral: Negative for depression, hallucinations, substance abuse and suicidal ideas.    Blood pressure (!) 164/64, pulse (!) 101, temperature 97.8 F (36.6 C), temperature source Oral, resp. rate 16, height 4' 11.45" (1.51 m),  weight 62.1 kg (137 lb), SpO2 97 %.Body mass index is 27.25 kg/m.  General Appearance: Casual paper scrubs   Eye Contact:  Good  Speech:  Clear and Coherent and Normal Rate  Volume:  Normal  Mood:  Dysphoric  Affect:  Congruent and Labile  Thought Process:  Disorganized and Descriptions of Associations: Loose  Orientation:  Full (Time, Place, and Person)  Thought Content:  Illogical, Delusions, Ideas of Reference:   Paranoia Delusions and Paranoid Ideation  Suicidal Thoughts:  unable to evaluate  Homicidal Thoughts:  unable to evaluate  Memory:  Immediate;   Good Recent;   Good Remote;   Good  Judgement:  Poor  Insight:  Lacking  Psychomotor Activity:  Normal  Concentration:  Concentration: Fair  Recall:  [employer]  Fund of Knowledge:  Fair  Language:  Fair  Akathisia:  No  Handed:    AIMS (if indicated):     Assets:  Resilience  ADL's:  Intact  Cognition:  WNL  Sleep:  Number of Hours: 6   Treatment Plan Summary: Daily contact  with patient to assess and evaluate symptoms and progress in treatment and Medication management    - Will continue today 07/05/2017 plan as below except where it is noted.  - Second opinion for forced medications last renewed on 06/25/17  -Schizophrenia - Continue haldol 5mg  po/IM qAM + haldol 10mg  po/IM qhs (give IM if pt refuses oral form).   -Anxiety/Agitation -Continueatarax 25mg  po q8h prn anxiety -ContinueHaldol 5mg  po/IM q6h prn agitation  - HTN -Continuelosartan 100 mg po qDay  - DMII -Continuemetformin 500mg  po BID  - Insomnia -Continuetrazodone 50mg  po qhs prn insomnia  -Encourage participation in groups and the therapeutic milieu -Discharge planning will be ongoing  Oneta Rackanika N Lewis, NP 07/05/2017, 11:00 AM Agree with NP Progress Note

## 2017-07-05 NOTE — [facility] Group Notes (Signed)
[facility] Group Notes:  (Nursing/MHT/Case Management/Adjunct)  Date:  07/05/2017  Time:  9:39 AM  Type of Therapy:  Orientation/Goals group  Participation Level:  Did Not Attend  Participation Quality:  Did Not Attend  Affect:  Did Not Attend  Cognitive:  Did Not Attend  Insight:  None  Engagement in Group:  Did Not Attend  Modes of Intervention:  Did Not Attend  Summary of Progress/Problems: Pt did not attend patient self inventory group/orientation group.   Jacquelyne BalintForrest, Viktoria Gruetzmacher Shanta 07/05/2017, 9:39 AM

## 2017-07-05 NOTE — [facility] Group Notes (Signed)
[facility] Group Notes:  (Nursing/MHT/Case Management/Adjunct)  Date:  07/05/2017  Time:  10:20 AM  Type of Therapy:  Psychoeducational Skills  Participation Level:  Did Not Attend  Participation Quality:  Did not attend  Affect:  Did not attend  Cognitive:  Did not attend  Insight:  None  Engagement in Group:  Did not attend  Modes of Intervention:  Did not attend  Summary of Progress/Problems: Pt did not attend Psychoeducational group with topic anger management.  Jacquelyne BalintForrest, Canaan Prue Shanta 07/05/2017, 10:20 AM

## 2017-07-05 NOTE — Progress Notes (Signed)
D: Patient seen in her room awake. Per report, patient has been isolating in her room. Patient refused the offer to attend group/sit out on dayroom. Patient stated "i'm here so I can think". Patient cooperative with bedtime meds. Denies pain, SI/HI, AH/VH at this time. Verbalized no concern. No behavior issues noted.   A: Staff offered support and encouragement as needed. Routine safety checks maintained. Will continue to monitor patient.   R: Patient remains safe on unit.

## 2017-07-05 NOTE — Progress Notes (Signed)
Patient ID: Sherri LuisMichelle S Gerard, female   DOB: 1955/11/20, 62 y.o.   MRN: [id number]   Pt refused am scheduled CBG.

## 2017-07-05 NOTE — Progress Notes (Signed)
D Patient is seen sitting on her bed..completing her daily assessment. SHe has a manic-like style in which she answers the questions on the assessment , ie " I was signed in Feb 4- March 39 days over the legal limit". A She endorses disorganized thinking, ie seclusive and isolative to her room , in line with her previous days' behavior. R Safety is in place .

## 2017-07-05 NOTE — [facility] Group Notes (Signed)
[facility] Group Notes:  (Nursing/MHT/Case Management/Adjunct)  Date:  07/05/2017  Time:  3:01 PM  Type of Therapy:  Psychoeducational Skills  Participation Level:  Did Not Attend  Participation Quality:  Did not attend  Affect:  Did not attend  Cognitive:  Did not attend  Insight:  None  Engagement in Group:  Did not attend  Modes of Intervention:  Did not attend  Summary of Progress/Problems: Pt did not attend Psychoeducational group with music.  Jacquelyne BalintForrest, Ezrael Sam Shanta 07/05/2017, 3:01 PM

## 2017-07-05 NOTE — Progress Notes (Signed)
Did not attend group 

## 2017-07-05 NOTE — [facility] Group Notes (Signed)
LCSW Group Therapy Note  07/05/2017   9:30-10:30am (300 hall)                10:30-11:30am (400 hall)                11:30am-12:00pm (500 hall)  Type of Therapy and Topic:  Group Therapy: Anger Cues and Responses  Participation Level:  Active   Description of Group:   In this group, patients learned how to recognize the physical, cognitive, emotional, and behavioral responses they have to anger-provoking situations.  They identified a recent time they became angry and how they reacted.  They analyzed how their reaction was possibly beneficial and how it was possibly unhelpful.  The group discussed a variety of healthier coping skills that could help with such a situation in the future.  Deep breathing was practiced briefly.  Therapeutic Goals: 1. Patients will remember their last incident of anger and how they felt emotionally and physically, what their thoughts were at the time, and how they behaved. 2. Patients will identify how their behavior at that time worked for them, as well as how it worked against them. 3. Patients will explore possible new behaviors to use in future anger situations. 4. Patients will learn that anger itself is normal and cannot be eliminated, and that healthier reactions can assist with resolving conflict rather than worsening situations.  Summary of Patient Progress:  The patient shared that her most recent time of anger was this week and said her brother who is her guardian has discovered that because of the cameras we have all over the hospital, the female satff can see her in the shower, and he is bringing a lawsuit.  CSW negated this and told her there are no cameras in the bathrooms.  She seemed to accept that statement but did not retract it.  She was engaged and seemed insightful during the group.  Therapeutic Modalities:   Cognitive Behavioral Therapy  Lynnell ChadMareida J Grossman-Orr  07/05/2017 12:00pm

## 2017-07-05 NOTE — Progress Notes (Signed)
Patient ID: Sherri [name], female   DOB: 06-12-1955, 62 y.o.   MRN: [id number]  D: Patient in room on approach. Per report pt has been isolating in her room most of the time.  Pt was observed sitting in dayroom talking to another pt for quite some time. Pt reports she is tolerating medication well. Pt denies SI/HI/AVH and pain. Pt attended evening wrap up group but left after a short while. Cooperative with assessment. No acute distressed noted at this time.   A: Medications administered as prescribed. Support and encouragement provided to attend groups and engage in milieu. Pt encouraged to discuss feelings and come to staff with any question or concerns.   R: Patient remains safe and complaint with medications.

## 2017-07-06 DIAGNOSIS — Z79899 Other long term (current) drug therapy: Secondary | ICD-10-CM

## 2017-07-06 DIAGNOSIS — Z7984 Long term (current) use of oral hypoglycemic drugs: Secondary | ICD-10-CM

## 2017-07-06 NOTE — Progress Notes (Signed)
[facility] MD Progress Note  07/06/2017 10:35 AM Sherri [name]  MRN:  [id number]  Subjective: patient states " I need you to process my discharge right now, you are not my Doctor".  Objective:  I have reviewed chart notes and have met with patient . Patient presents in hallway, able to interact with writer for brief period of time before walking away. She is not psycho-motorically agitated or threatening and she presents less irritable .  Focuses on being discharged, states that because of her [place] background her physicians are at the [place] and that she does not consider Probation officer to be her Doctor. Focuses on being discharged and asks writer if I am aware of  some of the people she knows, requests to see my ID badge, and is generally suspicious. Denies suicidal ideations, and states " I am fine ".  As per staff she has been accepting medications . Currently does not endorse medication side effects. Remains isolative with limited milieu or group participation.     Principal Problem: Schizophrenia, disorganized, chronic ([place]) Diagnosis:   Patient Active Problem List   Diagnosis Date Noted  . Schizophrenia, disorganized, chronic ([place]) [F20.1] 06/03/2017  . Schizoaffective disorder, bipolar type ([place]) [F25.0] 05/31/2017  . 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 Bipolar 1 disorder ([place])   . Hypertension   . Prediabetes   . Schizophrenia ([place])   . Tachycardia     Past Surgical History:  Procedure Laterality Date  . COLON SURGERY     Family History: History reviewed. No pertinent family history.  Family Psychiatric  History: See H&P  Social History:  Social History   Substance and Sexual Activity  Alcohol Use No  . Frequency: Never     Social History   Substance and Sexual Activity  Drug Use No     Social History   Socioeconomic History  . Marital status: Divorced    Spouse name: None  . Number of children: None  . Years of education: None  . Highest education level: None  Social Needs  . Financial resource strain: None  . Food insecurity - worry: None  . Food insecurity - inability: None  . Transportation needs - medical: None  . Transportation needs - non-medical: None  Occupational History  . None  Tobacco Use  . Smoking status: Never Smoker  . Smokeless tobacco: Never Used  Substance and Sexual Activity  . Alcohol use: No    Frequency: Never  . Drug use: No  . Sexual activity: None  Other Topics Concern  . None  Social History Narrative  . None   Additional Social History:   Sleep: Fair  Appetite:  Good  Current Medications: Current Facility-Administered Medications  Medication Dose Route Frequency Provider Last Rate Last Dose  . acetaminophen (TYLENOL) tablet 650 mg  650 mg Oral Q6H PRN Ethelene Hal, NP      . alum & mag hydroxide-simeth (MAALOX/MYLANTA) 200-200-20 MG/5ML suspension 30 mL  30 mL Oral Q4H PRN Ethelene Hal, NP      . calcium-vitamin D (OSCAL WITH D) 500-200 MG-UNIT per tablet 1 tablet  1 tablet Oral Q breakfast Ethelene Hal, NP   1 tablet at 07/06/17 3818  . haloperidol (HALDOL) tablet 10 mg  10 mg Oral QHS Pennelope Bracken, MD   10  mg at 07/05/17 2121   Or  . haloperidol lactate (HALDOL) injection 10 mg  10 mg Intramuscular QHS Maris Berger T, MD      . haloperidol (HALDOL) tablet 5 mg  5 mg Oral Q6H PRN Pennelope Bracken, MD       Or  . haloperidol lactate (HALDOL) injection 5 mg  5 mg Intramuscular Q6H PRN Pennelope Bracken, MD      . haloperidol (HALDOL) tablet 5 mg  5 mg Oral Rockne Menghini, MD   5 mg at 07/06/17 7564   Or  . haloperidol lactate (HALDOL) injection 5 mg  5 mg Intramuscular Tana Conch T, MD      . hydrOXYzine (ATARAX/VISTARIL)  tablet 25 mg  25 mg Oral Q8H Ethelene Hal, NP   25 mg at 07/06/17 0615  . losartan (COZAAR) tablet 100 mg  100 mg Oral Daily Ethelene Hal, NP   100 mg at 07/06/17 0830  . magnesium hydroxide (MILK OF MAGNESIA) suspension 30 mL  30 mL Oral Daily PRN Ethelene Hal, NP      . metFORMIN (GLUCOPHAGE) tablet 500 mg  500 mg Oral BID WC Ethelene Hal, NP   500 mg at 07/06/17 0830  . traZODone (DESYREL) tablet 50 mg  50 mg Oral QHS PRN Pennelope Bracken, MD       Lab Results: No results found for this or any previous visit (from the past 48 hour(s)).  Blood Alcohol level:  Lab Results  Component Value Date   ETH <10 05/30/2017   ETH <10 33/29/5188   Metabolic Disorder Labs: No results found for: HGBA1C, MPG No results found for: PROLACTIN No results found for: CHOL, TRIG, HDL, CHOLHDL, VLDL, LDLCALC  Physical Findings: AIMS: Facial and Oral Movements Muscles of Facial Expression: None, normal Lips and Perioral Area: None, normal Jaw: None, normal Tongue: None, normal,Extremity Movements Upper (arms, wrists, hands, fingers): None, normal Lower (legs, knees, ankles, toes): None, normal, Trunk Movements Neck, shoulders, hips: None, normal, Overall Severity Severity of abnormal movements (highest score from questions above): None, normal Incapacitation due to abnormal movements: None, normal Patient's awareness of abnormal movements (rate only patient's report): No Awareness, Dental Status Current problems with teeth and/or dentures?: No Does patient usually wear dentures?: No  CIWA:  CIWA-Ar Total: 1 COWS:     Musculoskeletal: Strength & Muscle Tone: within normal limits Gait & Station: normal Patient leans: N/A  Psychiatric Specialty Exam: Physical Exam  Nursing note and vitals reviewed.   Review of Systems  Constitutional: Negative for chills and fever.  Respiratory: Negative for cough.   Cardiovascular: Negative for chest pain.   Gastrointestinal: Negative for abdominal pain, heartburn, nausea and vomiting.  Psychiatric/Behavioral: Negative for depression, hallucinations, substance abuse and suicidal ideas.  Denies chest pain, no shortness of breath, no vomiting   Blood pressure (!) 164/64, pulse (!) 101, temperature 97.8 F (36.6 C), temperature source Oral, resp. rate 16, height 4' 11.45" (1.51 m), weight 62.1 kg (137 lb), SpO2 97 %.Body mass index is 27.25 kg/m.  General Appearance: improving grooming    Eye Contact:  Good  Speech:  Normal Rate  Volume:  Normal  Mood:  denies feeling depressed   Affect:  remains guarded, vaguely irritable   Thought Process:  Disorganized and Descriptions of Associations: Circumstantial  Orientation:  Other:  fully alert and attentive   Thought Content:  denies hallucinations, remains guarded, paranoid  Suicidal Thoughts:  No denies suicidal  ideations  Homicidal Thoughts:  No does not express any HI   Memory:  recent and remote fair   Judgement:  Poor  Insight:  limited   Psychomotor Activity:  Normal  Concentration:  Concentration: Fair and Attention Span: Fair  Recall:  [employer] of Knowledge:  Fair  Language:  Fair  Akathisia:  No  Handed:    AIMS (if indicated):     Assets:  Resilience  ADL's:  Intact  Cognition:  WNL  Sleep:  Number of Hours: 5.5    Assessment - patient remains guarded, suspicious, but overall appears less significantly irritable , more visible in milieu, and has not been refusing medications as before. Insight remains limited, remains focused on discharge, and tolerates only short interactions with staff/writer before walking away . Treatment Plan Summary:  Daily contact with patient to assess and evaluate symptoms and progress in treatment and Medication management  Treatment plan reviewed as below today 3/10   -Schizophrenia - Continue Haloperidol  67m PO/IM qAM + haldol 190mPO/IM qhs (give IM if pt refuses oral form).    -Anxiety/Agitation -ContinueAtarax 2513mO  q8h prn anxiety -ContinueHaldol 5mg54m/IM q6h prn agitation  - HTN -Continuelosartan 100 mg QDAY   - DMII -Continuemetformin 500mg34mD  - Insomnia -Continuetrazodone 50mg 71mPRN insomnia  -Encourage participation in groups and the therapeutic milieu -Treatment team working on disposition planning options   [facility]/01/2018, 10:35 AM   Patient ID: MichelLady Deutscherle   DOB: 5/28/106/10/1955.o68  MRN: [id number]

## 2017-07-06 NOTE — [facility] Group Notes (Signed)
[facility] Group Notes:  (Nursing/MHT/Case Management/Adjunct)  Date:  07/06/2017  Time:  10:13 AM  Type of Therapy:  Orientation/Goals group  Participation Level:  Did Not Attend  Participation Quality:  Did Not Attend  Affect:  Did Not Attend  Cognitive:  Did Not Attend  Insight:  None  Engagement in Group:  Did Not Attend  Modes of Intervention:  Did Not Attend  Summary of Progress/Problems: Pt did not attend patient self inventory group/orientation group.    Sherri [name] Shanta 07/06/2017, 10:13 AM 

## 2017-07-06 NOTE — Progress Notes (Signed)
Pt presents with a flat affect and anxious mood. Pt remains paranoid, disorganized and labile. Pt resistant to meds but will take meds with encouragement. Pt isolative to her room but will attend groups with encouragement. Pt continues to refuse Metformin, CBG and V/S due to paranoia. Orders reviewed. Verbal support provided. Pt encouraged to attend groups. 15 minute checks performed for safety.

## 2017-07-06 NOTE — [facility] Group Notes (Signed)
[facility]  Date/Time:  07/06/2017  11:00AM-12:00PM  Type of Therapy and Topic:  Group Therapy:  Music and Mood  Participation Level:  Active   Description of Group: In this process group, members listened to a variety of genres of music and identified that different types of music evoke different responses.  Patients were encouraged to identify music that was soothing for them and music that was energizing for them.  Patients discussed how this knowledge can help with wellness and recovery in various ways including managing depression and anxiety as well as encouraging healthy sleep habits.    Therapeutic Goals: 1. Patients will explore the impact of different varieties of music on mood 2. Patients will verbalize the thoughts they have when listening to different types of music 3. Patients will identify music that is soothing to them as well as music that is energizing to them 4. Patients will discuss how to use this knowledge to assist in maintaining wellness and recovery 5. Patients will explore the use of music as a coping skill  Summary of Patient Progress:  At the beginning of group, patient expressed feeling pretty good and at the end of group said she felt worn out, after asking to hear a patriotic song, saying it made her tired to remember herself as a Cytogeneticistveteran.  She talked to herself quite a bit throughout group.  She stated she enjoyed group.  Therapeutic Modalities: Solution Focused Brief Therapy Activity   Ambrose MantleMareida Grossman-Orr, LCSW

## 2017-07-06 NOTE — [facility] Group Notes (Signed)
[facility] Group Notes:  (Nursing/MHT/Case Management/Adjunct)  Date:  07/06/2017  Time:  10:17 AM  Type of Therapy:  Psychoeducational Skills  Participation Level:  Did Not Attend  Participation Quality:  Did not attend  Affect:  Did not attend  Cognitive:  Did not attend  Insight:  None  Engagement in Group:  Did not attend  Modes of Intervention:  Did not attend  Summary of Progress/Problems: Pt did not attend Psychoeducational group with topic healthy support systems.   Jacquelyne BalintForrest, Aleksi Brummet Shanta 07/06/2017, 10:17 AM

## 2017-07-06 NOTE — [facility] Group Notes (Signed)
[facility] Group Notes:  (Nursing/MHT/Case Management/Adjunct)  Date:  07/06/2017  Time:  2:54 PM  Type of Therapy:  Psychoeducational Skills  Participation Level:  Did Not Attend  Participation Quality:  Did not attend  Affect:  Did not attend  Cognitive:  Did not attend  Insight:  None  Engagement in Group:  Did not attend  Modes of Intervention:  Did not attend  Summary of Progress/Problems: Pt did not attend Psychoeducational group with topic love languages.  Jacquelyne BalintForrest, Glenyce Randle Shanta 07/06/2017, 2:54 PM

## 2017-07-07 NOTE — Progress Notes (Signed)
[facility]  07/07/2017 3:25 PM Sherri [name]  MRN:  [id number] Subjective:    Sherri BlalockMichelle [name] is a 62 y/o F with history of schizophrenia who was admitted with worening psychosis and paranoia. Pt has a guardian whom is her brother. She has received a second opinion for forced medicationswhich has been renewed multiple times during her admission - most recently on 06/25/17, but it was allowed to expire as pt has been recently adherent to prescribed oral haldol.She remains paranoid, guarded, and mostly uncooperative with the treatment team. SW team [facility], but thatremains to bean unavailable option at this time. Pt was changed from zyprexa to haldol due to lack of efficacy regarding her presenting symptoms and availability of haldol as a long-acting injectable form.   Today upon evaluation, pt was evaluated in her room with female RN staff present to ease pt's anxiety. She remains fixated her discharge and other topics regarding the illegality of her stay. Attempted to explain with patient that she is under IVC and her brother (guardian) has continued to be in agreement with plan to continue hospitalization. Pt denies SI/HI/AH/VH. She addresses this provider as a physician today as contrasted to previous encounters when she asserted that this provider was a CNA whom had been working against her for the past several months. Pt was asked directly about that delusional content, and she explains, "Your name was on a watch list several months ago, and I think you know what that is about." Pt agrees to continue her current regimen, and she refused to answer and further questions.    Principal Problem: Schizophrenia, disorganized, chronic ([facility]) Diagnosis:   Patient Active Problem List   Diagnosis Date Noted  . 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 Schizoaffective disorder, bipolar type ([facility])  [F25.0] 05/31/2017  . 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 Bipolar 1 disorder ([facility])   . Hypertension   . Prediabetes   . Schizophrenia ([facility])   . Tachycardia     Past Surgical History:  Procedure Laterality Date  . COLON SURGERY     Family History: History reviewed. No pertinent family history. Family Psychiatric  History: see H&P Social History:  Social History   Substance and Sexual Activity  Alcohol Use No  . Frequency: Never     Social History   Substance and Sexual Activity  Drug Use No    Social History   Socioeconomic History  . Marital status: Divorced    Spouse name: None  . Number of children: None  . Years of education: None  . Highest education level: None  Social Needs  . Financial resource strain: None  . Food insecurity - worry: None  . Food insecurity - inability: None  . Transportation needs - medical: None  . Transportation needs - non-medical: None  Occupational History  . None  Tobacco Use  . Smoking status: Never Smoker  . Smokeless tobacco: Never Used  Substance and Sexual Activity  . Alcohol use: No    Frequency: Never  . Drug use: No  . Sexual activity: None  Other Topics Concern  . None  Social History Narrative  . None   Additional Social History:  Sleep: Good  Appetite:  Good  Current Medications: Current Facility-Administered Medications  Medication Dose Route Frequency Provider Last Rate Last Dose  . acetaminophen (TYLENOL) tablet 650 mg  650 mg Oral Q6H PRN Laveda Abbe, NP      . alum & mag hydroxide-simeth (MAALOX/MYLANTA) 200-200-20 MG/5ML suspension 30 mL  30 mL Oral Q4H PRN Laveda Abbe, NP      . calcium-vitamin D (OSCAL WITH D) 500-200 MG-UNIT per tablet 1 tablet  1 tablet Oral Q  breakfast Laveda Abbe, NP   1 tablet at 07/07/17 0755  . haloperidol (HALDOL) tablet 10 mg  10 mg Oral QHS Jolyne Loa T, MD   10 mg at 07/06/17 2150   Or  . haloperidol lactate (HALDOL) injection 10 mg  10 mg Intramuscular QHS Jolyne Loa T, MD      . haloperidol (HALDOL) tablet 5 mg  5 mg Oral Q6H PRN Micheal Likens, MD       Or  . haloperidol lactate (HALDOL) injection 5 mg  5 mg Intramuscular Q6H PRN Micheal Likens, MD      . haloperidol (HALDOL) tablet 5 mg  5 mg Oral Veatrice Kells, MD   5 mg at 07/07/17 1610   Or  . haloperidol lactate (HALDOL) injection 5 mg  5 mg Intramuscular Billie Lade T, MD      . hydrOXYzine (ATARAX/VISTARIL) tablet 25 mg  25 mg Oral Q8H Laveda Abbe, NP   25 mg at 07/07/17 1412  . losartan (COZAAR) tablet 100 mg  100 mg Oral Daily Laveda Abbe, NP   100 mg at 07/07/17 0755  . magnesium hydroxide (MILK OF MAGNESIA) suspension 30 mL  30 mL Oral Daily PRN Laveda Abbe, NP      . metFORMIN (GLUCOPHAGE) tablet 500 mg  500 mg Oral BID WC Laveda Abbe, NP   500 mg at 07/07/17 0755  . traZODone (DESYREL) tablet 50 mg  50 mg Oral QHS PRN Micheal Likens, MD        Lab Results: No results found for this or any previous visit (from the past 48 hour(s)).  Blood Alcohol level:  Lab Results  Component Value Date   ETH <10 05/30/2017   ETH <10 04/11/2017    Metabolic Disorder Labs: No results found for: HGBA1C, MPG No results found for: PROLACTIN No results found for: CHOL, TRIG, HDL, CHOLHDL, VLDL, LDLCALC  Physical Findings: AIMS: Facial and Oral Movements Muscles of Facial Expression: None, normal Lips and Perioral Area: None, normal Jaw: None, normal Tongue: None, normal,Extremity Movements Upper (arms, wrists, hands, fingers): None, normal Lower (legs, knees, ankles, toes): None, normal, Trunk Movements Neck, shoulders, hips:  None, normal, Overall Severity Severity of abnormal movements (highest score from questions above): None, normal Incapacitation due to abnormal movements: None, normal Patient's awareness of abnormal movements (rate only patient's report): No Awareness, Dental Status Current problems with teeth and/or dentures?: No Does patient usually wear dentures?: No  CIWA:  CIWA-Ar Total: 1 COWS:     Musculoskeletal: Strength & Muscle Tone: within normal limits Gait & Station: normal Patient leans: N/A  Psychiatric Specialty Exam: Physical Exam  Nursing note and vitals reviewed.   Review of Systems  Constitutional: Negative for fever.  Respiratory: Negative for cough and shortness of breath.   Cardiovascular: Negative for chest pain.  Gastrointestinal: Negative for abdominal pain, heartburn, nausea and vomiting.  Psychiatric/Behavioral: Negative for depression, hallucinations and suicidal ideas.  The patient is not nervous/anxious.     Blood pressure (!) 164/64, pulse (!) 101, temperature 97.8 F (36.6 C), temperature source Oral, resp. rate 16, height 4' 11.45" (1.51 m), weight 62.1 kg (137 lb), SpO2 97 %.Body mass index is 27.25 kg/m.  General Appearance: Casual  Eye Contact:  Good  Speech:  Clear and Coherent and Normal Rate  Volume:  Normal  Mood:  Irritable  Affect:  Congruent and Constricted  Thought Process:  Goal Directed and Descriptions of Associations: Loose  Orientation:  Full (Time, Place, and Person)  Thought Content:  Delusions and Ideas of Reference:   Paranoia Delusions  Suicidal Thoughts:  No  Homicidal Thoughts:  No  Memory:  Immediate;   Fair Recent;   Fair Remote;   Fair  Judgement:  Poor  Insight:  Lacking  Psychomotor Activity:  Normal  Concentration:  Concentration: Fair  Recall:  [employer] of Knowledge:  Fair  Language:  Fair  Akathisia:  No  Handed:    AIMS (if indicated):     Assets:  Communication Skills Resilience Social Support  ADL's:  Intact   Cognition:  WNL  Sleep:  Number of Hours: 6.75   Treatment Plan Summary: Daily contact with patient to assess and evaluate symptoms and progress in treatment and Medication management   -Continue inpatient hospitalization  - Second opinion for forced medications last renewed on 06/25/17  -Schizophrenia - Continue haldol 5mg  po qAM + haldol 10mg  po qhs  -Anxiety/Agitation -Continueatarax 25mg  po q8h prn anxiety -ContinueHaldol 5mg  po/IM q6h prn agitation  - HTN -Continuelosartan 100mg  po qDay  - DMII -Continuemetformin 500mg  po BID  - Insomnia -Continuetrazodone 50mg  po qhs prn insomnia  -Encourage participation in groups and the therapeutic milieu  -Discharge planning will be ongoing     Micheal Likens, MD 07/07/2017, 3:25 PM

## 2017-07-07 NOTE — Progress Notes (Signed)
Psychoeducational Group Note  Date:  07/07/2017 Time:  2044  Group Topic/Focus:  Wrap-Up Group:   The focus of this group is to help patients review their daily goal of treatment and discuss progress on daily workbooks.  Participation Level: Did Not Attend  Participation Quality:  Not Applicable  Affect:  Not Applicable  Cognitive:  Not Applicable  Insight:  Not Applicable  Engagement in Group: Not Applicable  Additional Comments:  The patient refused to attend group this evening.   Hazle CocaGOODMAN, Kyleena Scheirer S 07/07/2017, 8:44 PM

## 2017-07-07 NOTE — Progress Notes (Signed)
Patient ID: [name], female   DOB: 1955-08-18, 62 y.o.   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  Pt denied +SI to this RN but wrote it on her self inventory sheet. Follow up was made, and pt denied SI/HI/AVH, and verbally contracts for safety on the unit.    Pt reports her sleep quality last night as being fair, reports her energy level as fair, reports her concentration level as good, rates her depression today as "2" (10 being the worst, rates her hopelessness level as "1" (10 being the worst), and rates her anxiety level as "1" (10 being the worst). Pt reports that her goal for today is "talk with doctor and social worker".  A: Pt maintained on Q15 minute safety checks, and empathy provided.  Pt educated on coping mechanisms, and verbalizes understanding.  Pt educated on her medications and took all meds as scheduled.  R: Will continue to monitor.

## 2017-07-07 NOTE — [facility] Group Notes (Signed)
LCSW Group Therapy Note   07/07/2017 1:15pm   Type of Therapy and Topic:  Group Therapy:  Overcoming Obstacles   Participation Level:  Did Not Attend   Description of Group:    In this group patients will be encouraged to explore what they see as obstacles to their own wellness and recovery. They will be guided to discuss their thoughts, feelings, and behaviors related to these obstacles. The group will process together ways to cope with barriers, with attention given to specific choices patients can make. Each patient will be challenged to identify changes they are motivated to make in order to overcome their obstacles. This group will be process-oriented, with patients participating in exploration of their own experiences as well as giving and receiving support and challenge from other group members.   Therapeutic Goals: 1. Patient will identify personal and current obstacles as they relate to admission. 2. Patient will identify barriers that currently interfere with their wellness or overcoming obstacles.  3. Patient will identify feelings, thought process and behaviors related to these barriers. 4. Patient will identify two changes they are willing to make to overcome these obstacles:      Summary of Patient Progress      Therapeutic Modalities:   Cognitive Behavioral Therapy Solution Focused Therapy Motivational Interviewing Relapse Prevention Therapy  Sherri RogueRodney B Judaea Burgoon, LCSW 07/07/2017 1:59 PM

## 2017-07-07 NOTE — Progress Notes (Addendum)
Recreation Therapy Notes  Date: 3.11.19 Time: 10:00 am Location: [address] Day[address]   Group Topic: Coping Skills   Goal Area(s) Addresses:  Goal 1.1: To improve coping skills  . Patient will Identify what a coping skills is  . Patient will identify personal coping skills  . Patient will be able to identify how coping skills can improve their wellness  Behavioral Response: Patient did not attend   Sheryle Hailarian Kylin Genna, Recreation Therapy Intern  Sheryle HailDarian Tyra Gural 07/07/2017 11:19 AM

## 2017-07-07 NOTE — Progress Notes (Signed)
Psychoeducational Group Note  Date:  07/07/2017 Time:  0916  Group Topic/Focus:  Goals Group:   The focus of this group is to help patients establish daily goals to achieve during treatment and discuss how the patient can incorporate goal setting into their daily lives to aide in recovery.  Participation Level: Did Not Attend  Participation Quality:  Not Applicable  Affect:  Not Applicable  Cognitive:  Not Applicable  Insight:  Not Applicable  Engagement in Group: Not Applicable  Additional Comments:  Pt refused to attend group this morning.  Luise Yamamoto E 07/07/2017, 9:16 AM

## 2017-07-07 NOTE — BHH Counselor (Addendum)
[phone].

## 2017-07-08 NOTE — Progress Notes (Signed)
Recreation Therapy Notes  Date: 3.12.19 Time: 10:00 a.m. Location: [address] Dayroom   Group Topic: Self-Esteem   Goal Area(s) Addresses:  Goal 1.1: To increase self-esteem  - Group will improve mood through participation during Recreation Therapy tx.  - Group will identify at least two positive traits by the end of therapy session.   - Group will identify the importance of self-esteem  Intervention:Craft/Art    Activity: Positive Affirmation Banner: Patients had the opportunity to color in a positive affirmation banner of their choice using the color pencils and markers provided.    Education: Self-Esteem   Education Outcome: Acknowledges Education  Clinical Observations/Feedback: Patient did not attend   Sheryle HailDarian Macrae [name], Recreation Therapy Intern   Sheryle HailDarian Marea [name] 07/08/2017 10:58 AM

## 2017-07-08 NOTE — Progress Notes (Signed)
D: Patient denies SI, HI or AVH. Patient presents as anxious and forwards little.  Pt. Reports that she slept well and appetite is good.  She became slightly agitated about not having some of her grooming requests met immediately but was redirectable and reassured that her needs would be met as soon as possible.  Pt. Is not fully participating during group time and has a disorganized thought process.  Pt. Is being closely monitored during medication pass for suspicion of cheeking her medication.    A: Patient given emotional support from RN. Patient encouraged to come to staff with concerns and/or questions. Patient's medication routine continued. Patient's orders and plan of care reviewed.   R: Patient remains appropriate and cooperative. Will continue to monitor patient q15 minutes for safety.

## 2017-07-08 NOTE — Tx Team (Signed)
Interdisciplinary Treatment and Diagnostic Plan Update  07/08/2017 Time of Session: 9:53 AM  Sherri [name] MRN: [id number]  Principal Diagnosis: Schizophrenia, disorganized, chronic (HCC)  Secondary Diagnoses: Principal Problem:   Schizophrenia, disorganized, chronic (HCC)   Current Medications:  Current Facility-Administered Medications  Medication Dose Route Frequency Provider Last Rate Last Dose  . acetaminophen (TYLENOL) tablet 650 mg  650 mg Oral Q6H PRN Laveda Abbe, NP      . alum & mag hydroxide-simeth (MAALOX/MYLANTA) 200-200-20 MG/5ML suspension 30 mL  30 mL Oral Q4H PRN Laveda Abbe, NP      . calcium-vitamin D (OSCAL WITH D) 500-200 MG-UNIT per tablet 1 tablet  1 tablet Oral Q breakfast Laveda Abbe, NP   1 tablet at 07/08/17 0800  . haloperidol (HALDOL) tablet 10 mg  10 mg Oral QHS Jolyne Loa T, MD   10 mg at 07/07/17 2200  . haloperidol (HALDOL) tablet 5 mg  5 mg Oral Q6H PRN Micheal Likens, MD       Or  . haloperidol lactate (HALDOL) injection 5 mg  5 mg Intramuscular Q6H PRN Micheal Likens, MD      . haloperidol (HALDOL) tablet 5 mg  5 mg Oral Veatrice Kells, MD   5 mg at 07/08/17 0644  . hydrOXYzine (ATARAX/VISTARIL) tablet 25 mg  25 mg Oral Q8H Laveda Abbe, NP   25 mg at 07/08/17 0644  . losartan (COZAAR) tablet 100 mg  100 mg Oral Daily Laveda Abbe, NP   100 mg at 07/08/17 0800  . magnesium hydroxide (MILK OF MAGNESIA) suspension 30 mL  30 mL Oral Daily PRN Laveda Abbe, NP      . metFORMIN (GLUCOPHAGE) tablet 500 mg  500 mg Oral BID WC Laveda Abbe, NP   500 mg at 07/08/17 0800  . traZODone (DESYREL) tablet 50 mg  50 mg Oral QHS PRN Micheal Likens, MD        PTA Medications: Medications Prior to Admission  Medication Sig Dispense Refill Last Dose  . acetaminophen (TYLENOL) 500 MG tablet Take 1,000 mg by mouth every 8 (eight) hours as  needed for mild pain.   unk  . calcium-vitamin D (OSCAL WITH D) 500-200 MG-UNIT tablet Take 1 tablet by mouth daily with breakfast.   Past Week at Unknown time  . chlorhexidine (PERIDEX) 0.12 % solution Use as directed 15 mLs in the mouth or throat 2 (two) times daily.   Past Week at Unknown time  . cholecalciferol (VITAMIN D) 1000 units tablet Take 2,000 Units by mouth daily.   Past Week at Unknown time  . Cinnamon 500 MG capsule Take 500 mg by mouth daily.   Past Week at Unknown time  . Cranberry (CRANBERRY CONCENTRATE) 500 MG CAPS Take 1 capsule by mouth daily.   Past Week at Unknown time  . ferrous sulfate 325 (65 FE) MG tablet Take 325 mg by mouth daily with breakfast.   Past Week at Unknown time  . guaifenesin (HUMIBID E) 400 MG TABS tablet Take 400 mg by mouth every 8 (eight) hours as needed (mucus relief).   unk  . guaiFENesin-dextromethorphan (ROBITUSSIN DM) 100-10 MG/5ML syrup Take 10 mLs by mouth every 4 (four) hours as needed for cough.   unk  . hydrOXYzine (ATARAX/VISTARIL) 25 MG tablet Take 1 tablet (25 mg total) by mouth every 6 (six) hours as needed for anxiety. (Patient taking differently: Take 25 mg by mouth every 8 (  eight) hours. ) 30 tablet 0 Past Week at Unknown time  . loratadine (CLARITIN) 10 MG tablet Take 10 mg by mouth daily as needed for allergies.   unk at Unknown time  . losartan (COZAAR) 100 MG tablet Take 100 mg by mouth daily.   Past Week at Unknown time  . metFORMIN (GLUCOPHAGE) 500 MG tablet Take 500 mg by mouth 2 (two) times daily with a meal.   Past Week at Unknown time  . Multiple Vitamin (MULTIVITAMIN WITH MINERALS) TABS tablet Take 1 tablet by mouth daily.   Past Week at Unknown time  . risperiDONE (RISPERDAL) 0.5 MG tablet Take 1 tablet (0.5 mg total) by mouth 2 (two) times daily. 60 tablet 0 Past Week at Unknown time    Treatment Modalities: Medication Management, Group therapy, Case management,  1 to 1 session with clinician, Psychoeducation, Recreational  therapy.  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  Therapeutic Interventions: 1 to 1 sessions, Unit Group sessions and Medication administration.  Evaluation of Outcomes: Progressing  Physician Treatment Plan for Secondary Diagnosis: Principal Problem:   Schizophrenia, disorganized, chronic (HCC)  Long Term Goal(s): Improvement in symptoms so as ready for discharge  Short Term Goals: Ability to identify and develop effective coping behaviors will improve Compliance with prescribed medications will improve Ability to identify and develop effective coping behaviors will improve  Medication Management: Evaluate patient's response, side effects, and tolerance of medication regimen.  Therapeutic Interventions: 1 to 1 sessions, Unit Group sessions and Medication administration.  Evaluation of Outcomes: Progressing   2/13: Pt remains paranoid, delusional, agitated and aggressive at time. She has poor insight. She has been refusing medications. We will change from Invega to zyprexa (PO or IM if pt refuses PO).   -DiscontinueInvega 6mg  po qDay             - Start zyprexa 5mg  po/IM qAM + 10mg  po/IM qhs (only give IM if pt refuses oral form)  -Anxiety/Agitation -Continuegabapentin 200mg  po BID -Continueatarax 25mg  po q8h prn anxiety -Continueagitation protocol  with zydis/ativan/geodon  - HTN -Continuelosartan 100mg  po qDay  - DMII -Continuemetformin 500mg  po BID  2/21: Pt remains delusional, pressured, labile, and paranoid. She is unwilling to engage with treatment team. We will continue her current medication regimen and continue to investigate possibility of transfer to [place]. Pt was rejected yesterday at [facility] due to their level of acuity.  - Second opinion for forced medications last renewed on 2/18 by Dr. Jackquline Berlin  -Schizophrenia -Continuezyprexa 5mg  po/IM qAM + 10mg  po/IM qhs (only give IM if pt refuses oral form)  -Anxiety/Agitation -Continuegabapentin 200mg  po BID -Continueatarax 25mg  po q8h prn anxiety -Continueagitation protocol with zydis/ativan/geodon  - HTN -Continuelosartan 100mg  po qDay  - DMII -Continuemetformin 500mg  po BID  2/26: Second opinion for forced medications last renewed on 2/18 by Dr. Jackquline Berlin - Pt now accepting of oral zyprexa at bedtime, but we will consider renewal of second opinion if pt refuses long-acting form of antipsychotic  -Schizophrenia -Continuezyprexa 5mg  po/IM qAM + 10mg  po/IM qhs (only give IM if  pt refuses oral form)  -Anxiety/Agitation -Continuegabapentin 200mg  po BID -Continueatarax 25mg  po q8h prn anxiety -Continueagitation protocol with zydis/ativan/geodon  - HTN -Continuelosartan 100mg  po qDay  - DMII -Continuemetformin 500mg  po BID  - Insomnia -Continuetrazodone 50mg  po qhs prn insomnia  2/28: -Schizophrenia             - DC scheduled Invega Sustenna (pt never received Sustenna injection) - Start Haldol 5mg  po/IM BID (only give IM if pt refuses PO form)  -Anxiety/Agitation -Continuegabapentin 200mg  po  BID -Continueatarax 25mg  po q8h prn anxiety -Discontinueagitation protocol with zydis/ativan/geodon            - Start Haldol 5mg  po/IM q6h prn agitation  3/5: pt is interviewed in the doorway of the office as she refuses to enter the room with female NP staff present (for pt comfort). Pt shares, "I'm fine." Pt answers a few other basic questions including denying any physical complaints and denying SI/HI/AH/VH, but she is otherwise uncooperative with the interview. She interrupts this provider multiple times and demands immediate discharge, explaining that her uncle has filed a Architectlaw suit against the hospital. Pt continues to address this provider as "Perini" and references to me as a "CNA" whom has been working to keep her in the hospital  - Second opinion for forced medications last renewed on 06/25/17  -Schizophrenia - ContinueHaldol 5mg  po/IM BID (only give IM if pt refuses PO form)  -Anxiety/Agitation -Continueatarax 25mg  po q8h prn anxiety -ContinueHaldol 5mg  po/IM q6h prn agitation  - HTN -Continuelosartan 100mg  po qDay  - DMII -Continuemetformin 500mg  po BID  3/7:  "Are you the one to sign me out of this hospital. I have been in this place x 30 days, it was a 10 days kind of stay. Now, I'm held against my will. I know you got the qualification to sign me out here, you just don't. That [place] guy is not my doctor". - Second opinion for forced medications last renewed on 06/25/17  -Schizophrenia - Continue haldol 5mg  po/IM qAM + haldol 10mg  po/IM qhs (give IM if pt refuses oral form).   -Anxiety/Agitation -Continueatarax 25mg  po q8h prn anxiety -ContinueHaldol 5mg  po/IM q6h prn agitation  3/12: Sherri [name] addresses this provider as a physician today as contrasted to previous encounters when she asserted that this provider was a CNA whom  had been working against her for the past several months. Pt was asked directly about that delusional content, and she explains, "Your name was on a watch list several months ago, and I think you know what that is about."  After giving pt morning medication pt appeared to be cheeking her medication, pt appeared to put the medications in her upper lip and immediately went back to her room.     RN Treatment Plan for Primary Diagnosis: Schizophrenia, disorganized, chronic (HCC) Long Term Goal(s): Knowledge of disease and therapeutic regimen to maintain health will improve  Short Term Goals: Ability to participate in decision making will improve, Ability to identify and develop effective coping behaviors will improve and Compliance with prescribed medications will improve  Medication Management: RN will administer medications as ordered by provider, will assess and evaluate patient's response and provide education to patient for prescribed medication. RN will report any adverse and/or side effects to prescribing provider.  Therapeutic Interventions: 1 on 1 counseling sessions, Psychoeducation, Medication administration, Evaluate responses to treatment, Monitor vital signs and CBGs as ordered, Perform/monitor CIWA, COWS, AIMS and Fall Risk screenings as ordered, Perform wound care treatments  as ordered.  Evaluation of Outcomes: Progressing   LCSW Treatment Plan for Primary Diagnosis: Schizophrenia, disorganized, chronic (HCC) Long Term Goal(s): Safe transition to appropriate next level of care at discharge, Engage patient in therapeutic group addressing interpersonal concerns.  Short Term Goals: Engage patient in aftercare planning with referrals and resources, Facilitate acceptance of mental health diagnosis and concerns, Identify triggers associated with mental health/substance abuse issues and Increase skills for wellness and recovery  Therapeutic Interventions: Assess for all discharge needs, 1  to 1 time with Social worker, Explore available resources and support systems, Assess for adequacy in community support network, Educate family and significant other(s) on suicide prevention, Complete Psychosocial Assessment, Interpersonal group therapy.  Evaluation of Outcomes: Progressing   Progress in Treatment: Attending groups: No Participating in groups: No Taking medication as prescribed: Yes Toleration of medication: Yes, no side effects reported at this time Family/Significant other contact made: Aaron Edelman [phone] (Brother/Legal Guardian) Patient understands diagnosis: No, limited insight Discussing patient identified problems/goals with staff: Yes Medical problems stabilized or resolved: Yes Denies suicidal/homicidal ideation: Yes Issues/concerns per patient self-inventory: None Other: N/A  New problem(s) identified: None identified at this time.   New Short Term/Long Term Goal(s): "I'm not supposed to be here.  I am supposed to be evaluated for a medication adjustment at the [place]".   Discharge Plan or Barriers: Upon discharge pt will return to [facility] and will follow up at [facility] and with the [place].   3/8:  2nd IVC hearing today-asked for an additional 21 days [again].  [facility]. Called today and they are now on diversion, so not taking any new referrals  3/11:  [facility]  If not [facility], their beds have been full.  Reason for Continuation of Hospitalization: Delusions  Mood Instability Medication stabilization   Estimated Length of Stay: 07/11/17  Attendees: Patient:  07/08/2017  9:53 AM  Physician: Jolyne Loa, MD 07/08/2017  9:53 AM  Nursing: Lissa Hoard  RN 07/08/2017  9:53 AM  RN Care Manager: Onnie Boer, RN 07/08/2017  9:53 AM  Social Worker: Richelle Ito, LCSW; Melba Coon, Social Work Intern 07/08/2017  9:53 AM  Recreational Therapist:  [name], LRT 07/08/2017  9:53 AM  Other: Tomasita Morrow, P4CC 07/08/2017  9:53 AM  Other:  07/08/2017  9:53 AM  Other: 07/08/2017  9:53 AM    Scribe for Treatment Team: Ida Rogue, LCSW 07/08/2017 9:53 AM

## 2017-07-08 NOTE — Progress Notes (Signed)
[facility] MD Progress Note  07/08/2017 4:16 PM Sherri [name]  MRN:  [id number]  Subjective: Sherri [name] reports, "I have been here now 43 days. When am I getting discharged?ibuprofen talked to Dr. Altamese Warren yesterday, he was quite understanding. He gave me his word that I will have a ride to get home once I get discharged from this place. No, I will not take any haldol injection whether monthly or every 6 months. I will rather stay on the oral medications at this time".   Sherri [name] is a 62 y/o F with history of schizophrenia who was admitted with worening psychosis and paranoia. Pt has a guardian whom is her brother. She has received a second opinion for forced medicationswhich has been renewed multiple times during her admission - most recently on 06/25/17, but it was allowed to expire as pt has been recently adherent to prescribed oral haldol.She remains paranoid, guarded, and mostly uncooperative with the treatment team. SW team has been exploring option of transfer to [facility] inpatient psychiatry unit, but thatremains to bean unavailable option at this time. Pt was changed from zyprexa to haldol due to lack of efficacy regarding her presenting symptoms and availability of haldol as a long-acting injectable form.   Today 07-08-17, upon evaluation, pt was evaluated in her room by this female provider. She remains fixated her discharge and other topics regarding the illegality of her stay. The attending psychiatrist had attempted to explain with patient that she is under IVC petition and her brother (guardian) has continued to be in agreement with plan to continue hospitalization. Pt denies SI/HI/AH/VH. However, when approached about starting the injectable Haldol decanoate which is done on monthly basis to help manage her symptoms which are now chronic, she bluntly declines citing she is a veteran, that her uncle has already sued this hospital for her involuntary confinement. The nursing staff  reports indicated that patient may have been cheeking her medications. Although, maintained a delusional statement about being confined in this hospital for 43 days, patient was able to engage well during this follow-up care assessment, making good eye contact without any outburst of anger. No changes made on this current plan of care.  Principal Problem: Schizophrenia, disorganized, chronic (HCC)  Diagnosis:   Patient Active Problem List   Diagnosis Date Noted  . 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 Schizoaffective disorder, bipolar type (HCC) [F25.0] 05/31/2017  . 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 Bipolar 1 disorder (HCC)   . Hypertension   . Prediabetes   . Schizophrenia (HCC)   . Tachycardia     Past Surgical History:  Procedure Laterality Date  . COLON SURGERY     Family History: History reviewed. No pertinent family history.  Family Psychiatric  History: See H&P  Social History:  Social History   Substance and Sexual Activity  Alcohol Use No  . Frequency: Never     Social History   Substance and Sexual Activity  Drug Use No    Social History   Socioeconomic History  . Marital status: Divorced    Spouse name: None  . Number of children: None  . Years of education: None  . Highest education level: None  Social Needs  . Financial resource strain: None  . Food insecurity - worry: None  . Food insecurity -  inability: None  . Transportation needs - medical: None  . Transportation needs - non-medical: None  Occupational History  . None  Tobacco Use  . Smoking status: Never Smoker  . Smokeless tobacco: Never Used  Substance and Sexual Activity  . Alcohol use: No    Frequency: Never  . Drug use: No  . Sexual activity: None  Other Topics Concern  .  None  Social History Narrative  . None   Additional Social History:   Sleep: Good  Appetite:  Good  Current Medications: Current Facility-Administered Medications  Medication Dose Route Frequency Provider Last Rate Last Dose  . acetaminophen (TYLENOL) tablet 650 mg  650 mg Oral Q6H PRN Laveda Abbe, NP      . alum & mag hydroxide-simeth (MAALOX/MYLANTA) 200-200-20 MG/5ML suspension 30 mL  30 mL Oral Q4H PRN Laveda Abbe, NP      . calcium-vitamin D (OSCAL WITH D) 500-200 MG-UNIT per tablet 1 tablet  1 tablet Oral Q breakfast Laveda Abbe, NP   1 tablet at 07/08/17 0800  . haloperidol (HALDOL) tablet 10 mg  10 mg Oral QHS Jolyne Loa T, MD   10 mg at 07/07/17 2200  . haloperidol (HALDOL) tablet 5 mg  5 mg Oral Q6H PRN Micheal Likens, MD       Or  . haloperidol lactate (HALDOL) injection 5 mg  5 mg Intramuscular Q6H PRN Micheal Likens, MD      . haloperidol (HALDOL) tablet 5 mg  5 mg Oral Veatrice Kells, MD   5 mg at 07/08/17 0644  . hydrOXYzine (ATARAX/VISTARIL) tablet 25 mg  25 mg Oral Q8H Laveda Abbe, NP   25 mg at 07/08/17 1311  . losartan (COZAAR) tablet 100 mg  100 mg Oral Daily Laveda Abbe, NP   100 mg at 07/08/17 0800  . magnesium hydroxide (MILK OF MAGNESIA) suspension 30 mL  30 mL Oral Daily PRN Laveda Abbe, NP      . metFORMIN (GLUCOPHAGE) tablet 500 mg  500 mg Oral BID WC Laveda Abbe, NP   500 mg at 07/08/17 0800  . traZODone (DESYREL) tablet 50 mg  50 mg Oral QHS PRN Micheal Likens, MD        Lab Results: No results found for this or any previous visit (from the past 48 hour(s)).  Blood Alcohol level:  Lab Results  Component Value Date   ETH <10 05/30/2017   ETH <10 04/11/2017   Metabolic Disorder Labs: No results found for: HGBA1C, MPG No results found for: PROLACTIN No results found for: CHOL, TRIG, HDL, CHOLHDL, VLDL, LDLCALC  Physical  Findings: AIMS: Facial and Oral Movements Muscles of Facial Expression: None, normal Lips and Perioral Area: None, normal Jaw: None, normal Tongue: None, normal,Extremity Movements Upper (arms, wrists, hands, fingers): None, normal Lower (legs, knees, ankles, toes): None, normal, Trunk Movements Neck, shoulders, hips: None, normal, Overall Severity Severity of abnormal movements (highest score from questions above): None, normal Incapacitation due to abnormal movements: None, normal Patient's awareness of abnormal movements (rate only patient's report): No Awareness, Dental Status Current problems with teeth and/or dentures?: No Does patient usually wear dentures?: No  CIWA:  CIWA-Ar Total: 1 COWS:     Musculoskeletal: Strength & Muscle Tone: within normal limits Gait & Station: normal Patient leans: N/A  Psychiatric Specialty Exam: Physical Exam  Nursing note and vitals reviewed.   Review of Systems  Constitutional: Negative for fever.  Respiratory: Negative for cough and shortness of breath.   Cardiovascular: Negative for chest pain.  Gastrointestinal: Negative for abdominal pain, heartburn, nausea and vomiting.  Psychiatric/Behavioral: Negative for depression, hallucinations and suicidal ideas. The patient is not nervous/anxious.     Blood pressure (!) 164/64, pulse (!) 101, temperature 97.8 F (36.6 C), temperature source Oral, resp. rate 16, height 4' 11.45" (1.51 m), weight 62.1 kg (137 lb), SpO2 97 %.Body mass index is 27.25 kg/m.  General Appearance: Casual  Eye Contact:  Good  Speech:  Clear and Coherent and Normal Rate  Volume:  Normal  Mood:  Irritable  Affect:  Congruent and Constricted  Thought Process:  Goal Directed and Descriptions of Associations: Loose  Orientation:  Full (Time, Place, and Person)  Thought Content:  Delusions and Ideas of Reference:   Paranoia Delusions  Suicidal Thoughts:  No  Homicidal Thoughts:  No  Memory:  Immediate;    Fair Recent;   Fair Remote;   Fair  Judgement:  Poor  Insight:  Lacking  Psychomotor Activity:  Normal  Concentration:  Concentration: Fair  Recall:  FiservFair  Fund of Knowledge:  Fair  Language:  Fair  Akathisia:  No  Handed:    AIMS (if indicated):     Assets:  Communication Skills Resilience Social Support  ADL's:  Intact  Cognition:  WNL  Sleep:  Number of Hours: 5   Treatment Plan Summary: Daily contact with patient to assess and evaluate symptoms and progress in treatment and Medication management   -Continue inpatient hospitalization  - Will continue today 07/08/2017 plan as below except where it is noted.  - Second opinion for forced medications last renewed on 06/25/17  -Schizophrenia - Continue haldol 5mg  po qAM + haldol 10mg  po qhs  -Anxiety/Agitation -Continueatarax 25mg  po q8h prn anxiety -ContinueHaldol 5mg  po/IM q6h prn agitation  - HTN -Continuelosartan 100mg  po qDay  - DMII -Continuemetformin 500mg  po BID  - Insomnia -Continuetrazodone 50mg  po qhs prn insomnia  -Encourage participation in groups and the therapeutic milieu  -Discharge planning will be ongoing  Armandina StammerAgnes Sharonica Kraszewski, NP, pmhnp, fnp-bc  07/08/2017, 4:16 PMPatient ID: Sherri [name], female   DOB: [birth year], 62 y.o.   MRN: [id number]

## 2017-07-08 NOTE — Progress Notes (Signed)
Patient ID: Sherri [name], female   DOB: [birth year], 62 y.o.   MRN: [id number]  D: Patient in her room on approach. Pt reports her day was well. Pt asked writer about the weather and how she wished the group had gone to the courtyard today.  Pt was pleasant and showed writer her mouth after taking medications. Pt paced the hall a little while before going to sleep. Pt reports she is tolerating medication well. Pt denies SI/HI/AVH and pain.  Cooperative with assessment.   A: Medications administered as prescribed. Support and encouragement provided to attend groups and engage more in milieu.   R: Patient remains safe and complaint with medications. Pt did not attend evening wrap up group.

## 2017-07-08 NOTE — Plan of Care (Signed)
  Safety: Periods of time without injury will increase 07/08/2017 0026 - Progressing by Delos HaringPhillips, Dorothea Yow A, RN Note Pt safe on the unit

## 2017-07-08 NOTE — Progress Notes (Signed)
D: Pt has minimal interaction with staff this evening. A: Pt was offered support and encouragement. Pt was given scheduled medications. Pt was encourage to attend groups. Q 15 minute checks were done for safety.   R: safety maintained on unit.

## 2017-07-08 NOTE — Progress Notes (Signed)
After giving pt morning medication pt appeared to be cheeking her medication, pt appeared to put the medications in her upper lip and immediately went back to her room.

## 2017-07-09 MED ORDER — HALOPERIDOL DECANOATE 100 MG/ML IM SOLN
100.0000 mg | INTRAMUSCULAR | Status: DC
Start: 2017-07-09 — End: 2017-07-16
  Administered 2017-07-09: 100 mg via INTRAMUSCULAR
  Filled 2017-07-09 (×2): qty 1

## 2017-07-09 NOTE — Progress Notes (Signed)
Recreation Therapy Notes  Date: 3.13.19 Time: 10:00 am  Location: [address] Dayroom   Group Topic: PharmacologistHealthy Support Systems, Special educational needs teacherCommunication, BaristaTeamwork   Goal Area(s) Addresses:   Goal 1.1: To build healthy support systems - Patient will identify the importance of a healthy support system - Patient will identify at least one person in their support system  - Patient will identify ways on how to improve their support system                      Intervention: Horticulture, Art   Activity: Patients were given a cup to use as a flower vase. Patients had the opportunity to paint and decorate their vase however they wanted. Patients then took the provided flowers and placed them into their vase. Patients were encouraged to keep their plant or give to one person in their support system   Education: Healthy Support System, Special educational needs teacherCommunication, Physiological scientistTeamwork, Discharge Planning    Education Outcome: Acknowledges Education  Clinical Observations/Feedback: Patient did not attend   Sheryle HailDarian Akita Maxim, Recreation Therapy Intern   Sheryle HailDarian Vida Nicol 07/09/2017 11:14 AM

## 2017-07-09 NOTE — Progress Notes (Signed)
Adult Psychoeducational Group Note  Date:  07/09/2017 Time:  8:33 PM  Group Topic/Focus:  Wrap-Up Group:   The focus of this group is to help patients review their daily goal of treatment and discuss progress on daily workbooks.  Participation Level:  Active  Participation Quality:  Appropriate  Affect:  Appropriate  Cognitive:  Appropriate  Insight: Appropriate  Engagement in Group:  Engaged  Modes of Intervention:  Discussion  Additional Comments:  The patient expressed that she rates today a 8.the patient also said that she enjoyed recreation therapy.  Octavio Mannshigpen, Viera Okonski Lee 07/09/2017, 8:33 PM

## 2017-07-09 NOTE — Progress Notes (Signed)
Data. Patient denies SI/HI. She is clearly [paranoid and responding to internal stimuli. Patient is watchful and answers any question with, "I'm fine thank you." Patient is isolating herself to her room except to come out to go to meals and to take medications. She attempts to cheeks and must have mouth checks done. Show of force was needed to have patient take her IM haldol DEC. This PM. She was able to take the med, without any issues, in her left delt. On her self assessment patient reports her goal to be, "Leave today. Feb 4- March 13- Ive been here 37 days."  Action. Emotional support and encouragement offered. Education provided on medication, indications and side effect. Q 15 minute checks done for safety. Response. Safety on the unit maintained through 15 minute checks.  Medications taken as prescribed. Attended groups.

## 2017-07-09 NOTE — B[facility] Counselor (Addendum)
[facility] [place]. Left a message for Peyton NajjarLarry at [place].

## 2017-07-09 NOTE — Plan of Care (Signed)
Patient is slightly improved in her attending unit activities. She still needs much redirection. Patient has had no injuries this shift.

## 2017-07-09 NOTE — Progress Notes (Signed)
[facility]  07/09/2017 3:20 PM Sherri [name]  MRN:  [id number] Subjective:    Sherri [name] is a 62 y/o F with history of schizophrenia who was admitted with worening psychosis and paranoia. Pt has a guardian whom is her brother. She has received a second opinion for forced medicationswhich has been renewed multiple times during her admission - most recently on 06/25/17, but it was allowed to expire as pt has been recently adherent to prescribed oral haldol.She remains paranoid, guarded, and mostly uncooperative with the treatment team. SW team has been [facility], but thatremains to bean unavailable option at this time. Pt was changed from zyprexa to haldol due to lack of efficacy regarding her presenting symptoms and availability of haldol as a long-acting injectable form.   Today upon evaluation, pt shares that she is doing well overall but she redirects the interview repeatedly towards her discharge "either today or tomorrow." She denies SI/HI/AH/VH. She denies any physical complaints. She is sleeping well and her appetite is good. Pt was asked if she would be willing to take haldol decanoate due to concerns about poor adherence, and pt refused. When topic of long-acting injectable was brought up, pt grew increasingly agitated and started making delusional statements such as addressing this provider as "Mr. Perini" which she had previously detailed as delusional belief that this provider has known the patient for several years and has been working against her.   Collateral information was obtained from pt's guardian (brother) regarding consent for long-acting injectable form. Pt's guardian was in agreement with plan to administer haldol decanoate even against wishes of patient. He is planning to visit the [place] from [place] next week. SW [facility].  Principal Problem: Schizophrenia, disorganized, chronic (HCC) Diagnosis:   Patient Active Problem List   Diagnosis Date Noted  . 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 Schizoaffective disorder, bipolar type (HCC) [F25.0] 05/31/2017  . 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 Bipolar 1 disorder (HCC)   . Hypertension   . Prediabetes   . Schizophrenia (HCC)   . Tachycardia     Past Surgical History:  Procedure Laterality Date  . COLON SURGERY     Family History: History reviewed. No pertinent family history. Family Psychiatric  History: see H&P Social History:  Social History   Substance and Sexual Activity  Alcohol Use No  . Frequency: Never     Social History   Substance and Sexual Activity  Drug Use No    Social History   Socioeconomic History  . Marital status: Divorced    Spouse name: None  . Number of children: None  . Years of education: None  . Highest education level: None  Social Needs  . Financial resource strain: None  . Food insecurity - worry: None  . Food insecurity - inability: None  . Transportation needs - medical: None  . Transportation needs - non-medical: None  Occupational History  . None  Tobacco Use  . Smoking status: Never Smoker  . Smokeless tobacco: Never Used  Substance and Sexual Activity  . Alcohol use: No    Frequency: Never  . Drug use: No  . Sexual activity: None  Other Topics Concern  .  None  Social History Narrative  . None   Additional Social History:                         Sleep: Good  Appetite:  Good  Current Medications: Current Facility-Administered Medications  Medication Dose Route Frequency Provider Last Rate Last Dose  . acetaminophen (TYLENOL) tablet 650 mg  650 mg Oral Q6H PRN  Laveda Abbe, NP      . alum & mag hydroxide-simeth (MAALOX/MYLANTA) 200-200-20 MG/5ML suspension 30 mL  30 mL Oral Q4H PRN Laveda Abbe, NP      . calcium-vitamin D (OSCAL WITH D) 500-200 MG-UNIT per tablet 1 tablet  1 tablet Oral Q breakfast Laveda Abbe, NP   1 tablet at 07/09/17 9604  . haloperidol (HALDOL) tablet 10 mg  10 mg Oral QHS Jolyne Loa T, MD   10 mg at 07/08/17 2110  . haloperidol (HALDOL) tablet 5 mg  5 mg Oral Q6H PRN Micheal Likens, MD       Or  . haloperidol lactate (HALDOL) injection 5 mg  5 mg Intramuscular Q6H PRN Micheal Likens, MD      . haloperidol (HALDOL) tablet 5 mg  5 mg Oral Billie Lade T, MD   5 mg at 07/09/17 5409  . haloperidol decanoate (HALDOL DECANOATE) 100 MG/ML injection 100 mg  100 mg Intramuscular Q30 days Jolyne Loa T, MD   100 mg at 07/09/17 1320  . hydrOXYzine (ATARAX/VISTARIL) tablet 25 mg  25 mg Oral Q8H Laveda Abbe, NP   25 mg at 07/09/17 8119  . losartan (COZAAR) tablet 100 mg  100 mg Oral Daily Laveda Abbe, NP   100 mg at 07/09/17 0737  . magnesium hydroxide (MILK OF MAGNESIA) suspension 30 mL  30 mL Oral Daily PRN Laveda Abbe, NP      . metFORMIN (GLUCOPHAGE) tablet 500 mg  500 mg Oral BID WC Laveda Abbe, NP   500 mg at 07/09/17 0737  . traZODone (DESYREL) tablet 50 mg  50 mg Oral QHS PRN Micheal Likens, MD        Lab Results: No results found for this or any previous visit (from the past 48 hour(s)).  Blood Alcohol level:  Lab Results  Component Value Date   ETH <10 05/30/2017   ETH <10 04/11/2017    Metabolic Disorder Labs: No results found for: HGBA1C, MPG No results found for: PROLACTIN No results found for: CHOL, TRIG, HDL, CHOLHDL, VLDL, LDLCALC  Physical Findings: AIMS: Facial and Oral Movements Muscles of Facial Expression: None, normal Lips and Perioral Area: None, normal Jaw: None,  normal Tongue: None, normal,Extremity Movements Upper (arms, wrists, hands, fingers): None, normal Lower (legs, knees, ankles, toes): None, normal, Trunk Movements Neck, shoulders, hips: None, normal, Overall Severity Severity of abnormal movements (highest score from questions above): None, normal Incapacitation due to abnormal movements: None, normal Patient's awareness of abnormal movements (rate only patient's report): No Awareness, Dental Status Current problems with teeth and/or dentures?: No Does patient usually wear dentures?: No  CIWA:  CIWA-Ar Total: 1 COWS:     Musculoskeletal: Strength & Muscle Tone: within normal limits Gait & Station: normal Patient leans: N/A  Psychiatric Specialty Exam: Physical Exam  Nursing note and vitals reviewed.   Review of Systems  Constitutional: Negative for chills and fever.  Respiratory: Negative for cough and shortness of breath.   Cardiovascular: Negative for chest pain.  Gastrointestinal: Negative for abdominal pain, heartburn, nausea and vomiting.  Psychiatric/Behavioral: Negative for depression, hallucinations and suicidal ideas. The patient is not nervous/anxious.     Blood pressure (!) 164/64, pulse (!) 101, temperature 97.8 F (36.6 C), temperature source Oral, resp. rate 16, height 4' 11.45" (1.51 m), weight 62.1 kg (137 lb), SpO2 97 %.Body mass index is 27.25 kg/m.  General Appearance: Casual and Fairly Groomed  Eye Contact:  Good  Speech:  Clear and Coherent and Normal Rate  Volume:  Normal  Mood:  Anxious and Dysphoric  Affect:  Congruent and Labile  Thought Process:  Coherent, Goal Directed and Descriptions of Associations: Loose  Orientation:  Full (Time, Place, and Person)  Thought Content:  Illogical, Delusions, Ideas of Reference:   Paranoia Delusions and Paranoid Ideation  Suicidal Thoughts:  No  Homicidal Thoughts:  No  Memory:  Immediate;   Fair Recent;   Fair Remote;   Fair  Judgement:  Poor  Insight:   Lacking  Psychomotor Activity:  Normal  Concentration:  Concentration: Fair  Recall:  Poor  Fund of Knowledge:  Fair  Language:  Fair  Akathisia:  No  Handed:    AIMS (if indicated):     Assets:  Social Support  ADL's:  Intact  Cognition:  WNL  Sleep:  Number of Hours: 6.5    Treatment Plan Summary: Daily contact with patient to assess and evaluate symptoms and progress in treatment and Medication management   -Continue inpatient hospitalization  -Schizophrenia - Continue haldol 5mg  po qAM + haldol 10mg  po qhs   - Start Haldol Decanoate 100mg  IM q30 Days  -Anxiety/Agitation -Continueatarax 25mg  po q8h prn anxiety -ContinueHaldol 5mg  po/IM q6h prn agitation  - HTN -Continuelosartan 100mg  po qDay  - DMII -Continuemetformin 500mg  po BID  - Insomnia -Continuetrazodone 50mg  po qhs prn insomnia  -Encourage participation in groups and the therapeutic milieu  -Discharge planning will be ongoing   Micheal Likens, MD 07/09/2017, 3:20 PM

## 2017-07-09 NOTE — Progress Notes (Signed)
  DATA ACTION RESPONSE  Objective- Pt. is visible in the room, seen pacing in room.Presents with a paranoid/suspicious/labile    affect and mood.No further c/o. No abnormal s/s.  Subjective- Denies having any SI/HI/AVH/Pain at this time.Is cooperative and remains safe on the unit.  1:1 interaction in private to establish rapport. Encouragement, education, & support given from staff.    Safety maintained with Q 15 checks. Continue with POC.

## 2017-07-10 NOTE — Progress Notes (Signed)
D: Patient denies SI, HI or AVH tonight. Patient presents as anxious but is otherwise pleasant and cooperative.  Pt. States that she had a good day, was able to go outside and get some fresh air and exercise.  Pt. Walked in to wrap up group tonight but did not stay, her interaction with staff and others is minimal.  Pt. Denies any physical complaints and denies any other needs at this time.    A: Patient given emotional support from RN. Patient encouraged to come to staff with concerns and/or questions. Patient's medication routine continued. Patient's orders and plan of care reviewed.   R: Patient remains appropriate and cooperative. Will continue to monitor patient q15 minutes for safety.

## 2017-07-10 NOTE — Progress Notes (Signed)
[facility] MD Progress Note  07/10/2017 2:54 PM Sherri [name]  MRN:  [id number] Subjective:    Sherri [name] is a 62 y/o F with history of schizophrenia who was admitted with worening psychosis and paranoia. Pt has a guardian whom is her brother. She has received a second opinion for forced medicationswhich has been renewed multiple times during her admission - most recently on 06/25/17, but it was allowed to expire as pt has been recently adherent to prescribed oral haldol.She remains paranoid, guarded, and mostlyuncooperative with the treatment team. SW team has been exploring option of transfer to [facility] inpatient psychiatry unit, but thatremains to bean unavailable option at this time. Pt was changed from zyprexa to haldol due to lack of efficacy regarding her presenting symptoms and availability of haldol as a long-acting injectable form, and yesterday she was administered first dose of haldol decanoate.  Today upon evaluation, pt is evaluated in her room with female RN staff present for pt's comfort. She is generally cooperative compared to previous encounters, and at no point during the interview did she address this provider by the name "Perini" which she had previously been doing as part of her paranoid delusional structure that this provider has been working against her for years prior to meeting her. Pt reports she is doing well overall but her focus remains on being discharged. She asks, "You promised me you'd help me get transportation out of here, right?" Confirmed with patient that when it was the appropriate time for her discharge, that we would assist in transporting her to her discharge location, and pt verbalized good understanding. Pt denies any physical complaints. She is sleeping well and her appetite is good. She denies SI/HI/AH/VH. She is tolerating her medications without side effects. Discussed with patient about receiving haldol decanoate yesterday, and pt reports it  went well despite her disagreement with the treatment recommendation. Discussed with patient that her brother (guardian) is coming to visit from [place] and we are planning on a family meeting for 07/15/17, and pt verbalized good understanding. Pt had no other questions, comments, or concerns, and she requested politely to conclude the interview, so interview was ended.  Principal Problem: Schizophrenia, disorganized, chronic (HCC) Diagnosis:   Patient Active Problem List   Diagnosis Date Noted  . 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 Schizoaffective disorder, bipolar type (HCC) [F25.0] 05/31/2017  . 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 Bipolar 1 disorder (HCC)   . Hypertension   . Prediabetes   . Schizophrenia (HCC)   . Tachycardia     Past Surgical History:  Procedure Laterality Date  . COLON SURGERY     Family History: History reviewed. No pertinent family history. Family Psychiatric  History: see H&P Social History:  Social History   Substance and Sexual Activity  Alcohol Use No  . Frequency: Never     Social History   Substance and Sexual Activity  Drug Use No    Social History   Socioeconomic History  . Marital status: Divorced    Spouse name: None  . Number of children: None  . Years of education: None  . Highest education level: None  Social Needs  . Financial resource strain: None  . Food insecurity - worry: None  . Food insecurity - inability: None  . Transportation needs -  medical: None  . Transportation needs - non-medical: None  Occupational History  . None  Tobacco Use  . Smoking status: Never Smoker  . Smokeless tobacco: Never Used  Substance and Sexual Activity  . Alcohol use: No    Frequency: Never  . Drug use: No  . Sexual activity: None   Other Topics Concern  . None  Social History Narrative  . None   Additional Social History:                         Sleep: Good  Appetite:  Good  Current Medications: Current Facility-Administered Medications  Medication Dose Route Frequency Provider Last Rate Last Dose  . acetaminophen (TYLENOL) tablet 650 mg  650 mg Oral Q6H PRN Laveda AbbeParks, Laurie Britton, NP      . alum & mag hydroxide-simeth (MAALOX/MYLANTA) 200-200-20 MG/5ML suspension 30 mL  30 mL Oral Q4H PRN Laveda AbbeParks, Laurie Britton, NP      . calcium-vitamin D (OSCAL WITH D) 500-200 MG-UNIT per tablet 1 tablet  1 tablet Oral Q breakfast Laveda AbbeParks, Laurie Britton, NP   1 tablet at 07/10/17 0900  . haloperidol (HALDOL) tablet 10 mg  10 mg Oral QHS Jolyne Loaainville, John Vasconcelos T, MD   10 mg at 07/09/17 2112  . haloperidol (HALDOL) tablet 5 mg  5 mg Oral Q6H PRN Micheal Likensainville, Shakeria Robinette T, MD       Or  . haloperidol lactate (HALDOL) injection 5 mg  5 mg Intramuscular Q6H PRN Micheal Likensainville, Shawniece Oyola T, MD      . haloperidol (HALDOL) tablet 5 mg  5 mg Oral BH-q7a Jolyne Loaainville, Nuh Lipton T, MD   5 mg at 07/10/17 0600  . haloperidol decanoate (HALDOL DECANOATE) 100 MG/ML injection 100 mg  100 mg Intramuscular Q30 days Jolyne Loaainville, Terez Freimark T, MD   100 mg at 07/09/17 1320  . hydrOXYzine (ATARAX/VISTARIL) tablet 25 mg  25 mg Oral Q8H Laveda AbbeParks, Laurie Britton, NP   25 mg at 07/10/17 1337  . losartan (COZAAR) tablet 100 mg  100 mg Oral Daily Laveda AbbeParks, Laurie Britton, NP   100 mg at 07/10/17 0735  . magnesium hydroxide (MILK OF MAGNESIA) suspension 30 mL  30 mL Oral Daily PRN Laveda AbbeParks, Laurie Britton, NP      . metFORMIN (GLUCOPHAGE) tablet 500 mg  500 mg Oral BID WC Laveda AbbeParks, Laurie Britton, NP   500 mg at 07/10/17 0736  . traZODone (DESYREL) tablet 50 mg  50 mg Oral QHS PRN Micheal Likensainville, Chantella Creech T, MD        Lab Results: No results found for this or any previous visit (from the past 48 hour(s)).  Blood Alcohol level:  Lab Results  Component Value Date    ETH <10 05/30/2017   ETH <10 04/11/2017    Metabolic Disorder Labs: No results found for: HGBA1C, MPG No results found for: PROLACTIN No results found for: CHOL, TRIG, HDL, CHOLHDL, VLDL, LDLCALC  Physical Findings: AIMS: Facial and Oral Movements Muscles of Facial Expression: None, normal Lips and Perioral Area: None, normal Jaw: None, normal Tongue: None, normal,Extremity Movements Upper (arms, wrists, hands, fingers): None, normal Lower (legs, knees, ankles, toes): None, normal, Trunk Movements Neck, shoulders, hips: None, normal, Overall Severity Severity of abnormal movements (highest score from questions above): None, normal Incapacitation due to abnormal movements: None, normal Patient's awareness of abnormal movements (rate only patient's report): No Awareness, Dental Status Current problems with teeth and/or dentures?: No Does patient usually wear dentures?: No  CIWA:  CIWA-Ar Total:  1 COWS:     Musculoskeletal: Strength & Muscle Tone: within normal limits Gait & Station: normal Patient leans: N/A  Psychiatric Specialty Exam: Physical Exam  Nursing note and vitals reviewed.   Review of Systems  Constitutional: Negative for chills and fever.  Respiratory: Negative for cough and shortness of breath.   Cardiovascular: Negative for chest pain.  Gastrointestinal: Negative for abdominal pain, heartburn, nausea and vomiting.  Psychiatric/Behavioral: Negative for depression, hallucinations and suicidal ideas. The patient is not nervous/anxious.     Blood pressure (!) 164/64, pulse (!) 101, temperature 97.8 F (36.6 C), temperature source Oral, resp. rate 16, height 4' 11.45" (1.51 m), weight 62.1 kg (137 lb), SpO2 97 %.Body mass index is 27.25 kg/m.  General Appearance: Casual and Fairly Groomed  Eye Contact:  Good  Speech:  Clear and Coherent and Normal Rate  Volume:  Normal  Mood:  Euthymic  Affect:  Appropriate, Congruent and Constricted  Thought Process:   Coherent, Goal Directed and Descriptions of Associations: Loose  Orientation:  Full (Time, Place, and Person)  Thought Content:  Paranoid Ideation  Suicidal Thoughts:  No  Homicidal Thoughts:  No  Memory:  Immediate;   Fair Recent;   Fair Remote;   Fair  Judgement:  Impaired  Insight:  Lacking  Psychomotor Activity:  Normal  Concentration:  Concentration: Good  Recall:  Good  Fund of Knowledge:  Good  Language:  Fair  Akathisia:  No  Handed:    AIMS (if indicated):     Assets:  Communication Skills Resilience Social Support  ADL's:  Intact  Cognition:  WNL  Sleep:  Number of Hours: 6.75   Treatment Plan Summary: Daily contact with patient to assess and evaluate symptoms and progress in treatment and Medication management   -Continue inpatient hospitalization  -Schizophrenia - Continuehaldol 5mg  po qAM + haldol 10mg  po qhs             - Continue Haldol Decanoate 100mg  IM q30 Days (given on 07/09/17)  -Anxiety/Agitation -Continueatarax 25mg  po q8h prn anxiety -ContinueHaldol 5mg  po/IM q6h prn agitation  - HTN -Continuelosartan 100mg  po qDay  - DMII -Continuemetformin 500mg  po BID  - Insomnia -Continuetrazodone 50mg  po qhs prn insomnia  -Encourage participation in groups and the therapeutic milieu  -Discharge planning will be ongoing  Micheal Likens, MD 07/10/2017, 2:54 PM

## 2017-07-10 NOTE — Tx Team (Signed)
Interdisciplinary Treatment and Diagnostic Plan Update  07/10/2017 Time of Session: 8:59 AM  Sherri [name] MRN: [id number]  Principal Diagnosis: Schizophrenia, disorganized, chronic (HCC)  Secondary Diagnoses: Principal Problem:   Schizophrenia, disorganized, chronic (HCC)   Current Medications:  Current Facility-Administered Medications  Medication Dose Route Frequency Provider Last Rate Last Dose  . acetaminophen (TYLENOL) tablet 650 mg  650 mg Oral Q6H PRN Laveda AbbeParks, Laurie Britton, NP      . alum & mag hydroxide-simeth (MAALOX/MYLANTA) 200-200-20 MG/5ML suspension 30 mL  30 mL Oral Q4H PRN Laveda AbbeParks, Laurie Britton, NP      . calcium-vitamin D (OSCAL WITH D) 500-200 MG-UNIT per tablet 1 tablet  1 tablet Oral Q breakfast Laveda AbbeParks, Laurie Britton, NP   1 tablet at 07/10/17 0900  . haloperidol (HALDOL) tablet 10 mg  10 mg Oral QHS Jolyne Loaainville, Christopher T, MD   10 mg at 07/09/17 2112  . haloperidol (HALDOL) tablet 5 mg  5 mg Oral Q6H PRN Micheal Likensainville, Christopher T, MD       Or  . haloperidol lactate (HALDOL) injection 5 mg  5 mg Intramuscular Q6H PRN Micheal Likensainville, Christopher T, MD      . haloperidol (HALDOL) tablet 5 mg  5 mg Oral BH-q7a Jolyne Loaainville, Christopher T, MD   5 mg at 07/10/17 0600  . haloperidol decanoate (HALDOL DECANOATE) 100 MG/ML injection 100 mg  100 mg Intramuscular Q30 days Jolyne Loaainville, Christopher T, MD   100 mg at 07/09/17 1320  . hydrOXYzine (ATARAX/VISTARIL) tablet 25 mg  25 mg Oral Q8H Laveda AbbeParks, Laurie Britton, NP   25 mg at 07/10/17 0600  . losartan (COZAAR) tablet 100 mg  100 mg Oral Daily Laveda AbbeParks, Laurie Britton, NP   100 mg at 07/10/17 0735  . magnesium hydroxide (MILK OF MAGNESIA) suspension 30 mL  30 mL Oral Daily PRN Laveda AbbeParks, Laurie Britton, NP      . metFORMIN (GLUCOPHAGE) tablet 500 mg  500 mg Oral BID WC Laveda AbbeParks, Laurie Britton, NP   500 mg at 07/10/17 0736  . traZODone (DESYREL) tablet 50 mg  50 mg Oral QHS PRN Micheal Likensainville, Christopher T, MD        PTA  Medications: Medications Prior to Admission  Medication Sig Dispense Refill Last Dose  . acetaminophen (TYLENOL) 500 MG tablet Take 1,000 mg by mouth every 8 (eight) hours as needed for mild pain.   unk  . calcium-vitamin D (OSCAL WITH D) 500-200 MG-UNIT tablet Take 1 tablet by mouth daily with breakfast.   Past Week at Unknown time  . chlorhexidine (PERIDEX) 0.12 % solution Use as directed 15 mLs in the mouth or throat 2 (two) times daily.   Past Week at Unknown time  . cholecalciferol (VITAMIN D) 1000 units tablet Take 2,000 Units by mouth daily.   Past Week at Unknown time  . Cinnamon 500 MG capsule Take 500 mg by mouth daily.   Past Week at Unknown time  . Cranberry (CRANBERRY CONCENTRATE) 500 MG CAPS Take 1 capsule by mouth daily.   Past Week at Unknown time  . ferrous sulfate 325 (65 FE) MG tablet Take 325 mg by mouth daily with breakfast.   Past Week at Unknown time  . guaifenesin (HUMIBID E) 400 MG TABS tablet Take 400 mg by mouth every 8 (eight) hours as needed (mucus relief).   unk  . guaiFENesin-dextromethorphan (ROBITUSSIN DM) 100-10 MG/5ML syrup Take 10 mLs by mouth every 4 (four) hours as needed for cough.   unk  . hydrOXYzine (ATARAX/VISTARIL) 25  MG tablet Take 1 tablet (25 mg total) by mouth every 6 (six) hours as needed for anxiety. (Patient taking differently: Take 25 mg by mouth every 8 (eight) hours. ) 30 tablet 0 Past Week at Unknown time  . loratadine (CLARITIN) 10 MG tablet Take 10 mg by mouth daily as needed for allergies.   unk at Unknown time  . losartan (COZAAR) 100 MG tablet Take 100 mg by mouth daily.   Past Week at Unknown time  . metFORMIN (GLUCOPHAGE) 500 MG tablet Take 500 mg by mouth 2 (two) times daily with a meal.   Past Week at Unknown time  . Multiple Vitamin (MULTIVITAMIN WITH MINERALS) TABS tablet Take 1 tablet by mouth daily.   Past Week at Unknown time  . risperiDONE (RISPERDAL) 0.5 MG tablet Take 1 tablet (0.5 mg total) by mouth 2 (two) times daily. 60  tablet 0 Past Week at Unknown time    Treatment Modalities: Medication Management, Group therapy, Case management,  1 to 1 session with clinician, Psychoeducation, Recreational therapy.  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  Therapeutic Interventions: 1 to 1 sessions, Unit Group sessions and Medication administration.  Evaluation of Outcomes: Progressing  Physician Treatment Plan for Secondary Diagnosis: Principal Problem:   Schizophrenia, disorganized, chronic (HCC)  Long Term Goal(s): Improvement in symptoms so as ready for discharge  Short Term Goals: Ability to identify and develop effective coping behaviors will improve Compliance with prescribed medications will improve Ability to identify and develop effective coping behaviors will improve  Medication Management: Evaluate patient's response, side effects, and tolerance of medication regimen.  Therapeutic Interventions: 1 to 1 sessions, Unit Group sessions and Medication administration.  Evaluation of Outcomes: Progressing   2/13: Pt remains paranoid, delusional, agitated and aggressive at time. She has poor insight. She has been refusing medications. We will change from Invega to zyprexa (PO or IM if pt refuses PO).   -DiscontinueInvega 6mg  po qDay             - Start zyprexa 5mg  po/IM qAM + 10mg  po/IM qhs (only give IM if pt  refuses oral form)  -Anxiety/Agitation -Continuegabapentin 200mg  po BID -Continueatarax 25mg  po q8h prn anxiety -Continueagitation protocol with zydis/ativan/geodon  - HTN -Continuelosartan 100mg  po qDay  - DMII -Continuemetformin 500mg  po BID  2/21: Pt remains delusional, pressured, labile, and paranoid. She is unwilling to engage with treatment team. We will continue her current medication regimen and continue to investigate possibility of transfer to [place]. Pt was rejected yesterday at [facility] due to their level of acuity.  - Second opinion for forced medications last renewed on 2/18 by Dr. Jackquline Berlin  -Schizophrenia -Continuezyprexa 5mg  po/IM qAM + 10mg  po/IM qhs (only give IM if pt refuses oral form)  -Anxiety/Agitation -Continuegabapentin 200mg  po BID -Continueatarax 25mg  po q8h prn anxiety -Continueagitation protocol with zydis/ativan/geodon  - HTN -Continuelosartan 100mg  po qDay  - DMII -Continuemetformin 500mg  po BID  2/26: Second opinion for forced medications last renewed on 2/18 by Dr. Jackquline Berlin - Pt now accepting of oral zyprexa at bedtime, but  we will consider renewal of second opinion if pt refuses long-acting form of antipsychotic  -Schizophrenia -Continuezyprexa 5mg  po/IM qAM + 10mg  po/IM qhs (only give IM if pt refuses oral form)  -Anxiety/Agitation -Continuegabapentin 200mg  po BID -Continueatarax 25mg  po q8h prn anxiety -Continueagitation protocol with zydis/ativan/geodon  - HTN -Continuelosartan 100mg  po qDay  - DMII -Continuemetformin 500mg  po BID  - Insomnia -Continuetrazodone 50mg  po qhs prn insomnia  2/28: -Schizophrenia             - DC scheduled Invega Sustenna (pt never received  Sustenna injection) - Start Haldol 5mg  po/IM BID (only give IM if pt refuses PO form)  -Anxiety/Agitation -Continuegabapentin 200mg  po BID -Continueatarax 25mg  po q8h prn anxiety -Discontinueagitation protocol with zydis/ativan/geodon            - Start Haldol 5mg  po/IM q6h prn agitation  3/5: pt is interviewed in the doorway of the office as she refuses to enter the room with female NP staff present (for pt comfort). Pt shares, "I'm fine." Pt answers a few other basic questions including denying any physical complaints and denying SI/HI/AH/VH, but she is otherwise uncooperative with the interview. She interrupts this provider multiple times and demands immediate discharge, explaining that her uncle has filed a Architect the hospital. Pt continues to address this provider as "Perini" and references to me as a "CNA" whom has been working to keep her in the hospital  - Second opinion for forced medications last renewed on 06/25/17  -Schizophrenia - ContinueHaldol 5mg  po/IM BID (only give IM if pt refuses PO form)  -Anxiety/Agitation -Continueatarax 25mg  po q8h prn anxiety -ContinueHaldol 5mg  po/IM q6h prn agitation  - HTN -Continuelosartan 100mg  po qDay  - DMII -Continuemetformin 500mg  po BID  3/7:  "Are you the one to sign me out of this hospital. I have been in this place x 30 days, it was a 10 days kind of stay. Now, I'm held against my will. I know you got the qualification to sign me out here, you just don't. That [place] guy is not my doctor". - Second opinion for forced medications last renewed on 06/25/17  -Schizophrenia - Continue haldol 5mg  po/IM qAM + haldol 10mg  po/IM qhs (give IM if pt refuses oral form).   -Anxiety/Agitation -Continueatarax 25mg  po q8h prn anxiety -ContinueHaldol 5mg  po/IM q6h  prn agitation  3/12: Evalyn addresses this provider as a physician today as contrasted to previous encounters when she asserted that this provider was a CNA whom had been working against her for the past several months. Pt was asked directly about that delusional content, and she explains, "Your name was on a watch list several months ago, and I think you know what that is about."  After giving pt morning medication pt appeared to be cheeking her medication, pt appeared to put the medications in her upper lip and immediately went back to her room.   3/14:  When topic of long-acting injectable was brought up, pt grew increasingly agitated and started making delusional statements such as addressing this provider as "Mr. Perini" which she had previously detailed as delusional belief that this provider has known the patient for several years and has been working against her.   Collateral information was obtained from pt's guardian (brother) regarding consent for long-acting injectable form. Pt's guardian was in agreement with plan to administer haldol decanoate even against wishes of patient. He is planning to visit the patient from [place] next week. SW team continues to explore  the possibility of transfer to [place], if available.   - Continuehaldol 5mg  po qAM + haldol 10mg  po qhs             - Start Haldol Decanoate 100mg  IM q30 Days      RN Treatment Plan for Primary Diagnosis: Schizophrenia, disorganized, chronic (HCC) Long Term Goal(s): Knowledge of disease and therapeutic regimen to maintain health will improve  Short Term Goals: Ability to participate in decision making will improve, Ability to identify and develop effective coping behaviors will improve and Compliance with prescribed medications will improve  Medication Management: RN will administer medications as ordered by provider, will assess and evaluate patient's response and provide education to patient for prescribed  medication. RN will report any adverse and/or side effects to prescribing provider.  Therapeutic Interventions: 1 on 1 counseling sessions, Psychoeducation, Medication administration, Evaluate responses to treatment, Monitor vital signs and CBGs as ordered, Perform/monitor CIWA, COWS, AIMS and Fall Risk screenings as ordered, Perform wound care treatments as ordered.  Evaluation of Outcomes: Progressing   LCSW Treatment Plan for Primary Diagnosis: Schizophrenia, disorganized, chronic (HCC) Long Term Goal(s): Safe transition to appropriate next level of care at discharge, Engage patient in therapeutic group addressing interpersonal concerns.  Short Term Goals: Engage patient in aftercare planning with referrals and resources, Facilitate acceptance of mental health diagnosis and concerns, Identify triggers associated with mental health/substance abuse issues and Increase skills for wellness and recovery  Therapeutic Interventions: Assess for all discharge needs, 1 to 1 time with Social worker, Explore available resources and support systems, Assess for adequacy in community support network, Educate family and significant other(s) on suicide prevention, Complete Psychosocial Assessment, Interpersonal group therapy.  Evaluation of Outcomes: Progressing   Progress in Treatment: Attending groups: No Participating in groups: No Taking medication as prescribed: Yes Toleration of medication: Yes, no side effects reported at this time Family/Significant other contact made: Aaron Edelman [phone] (Brother/Legal Guardian) Patient understands diagnosis: No, limited insight Discussing patient identified problems/goals with staff: Yes Medical problems stabilized or resolved: Yes Denies suicidal/homicidal ideation: Yes Issues/concerns per patient self-inventory: None Other: N/A  New problem(s) identified: None identified at this time.   New Short Term/Long Term Goal(s): "I'm not supposed  to be here.  I am supposed to be evaluated for a medication adjustment at the [place]".   Discharge Plan or Barriers: Upon discharge pt will return to [facility] and will follow up at [facility] and with the [place].   3/8:  2nd IVC hearing today-asked for an additional 21 days [again].  Pt has been rejected at [facility] twice. Called today and they are now on diversion, so not taking any new referrals  3/11:  [facility]  [facility] not [facility], their beds have been full.  Reason for Continuation of Hospitalization: Delusions  Mood Instability Medication stabilization   Estimated Length of Stay: 07/15/17  Attendees: Patient:  07/10/2017  8:59 AM  Physician: Jolyne Loa, MD 07/10/2017  8:59 AM  Nursing: Lissa Hoard  RN 07/10/2017  8:59 AM  RN Care Manager: Onnie Boer, RN 07/10/2017  8:59 AM  Social Worker: Richelle Ito, LCSW; Melba Coon, Social Work Intern 07/10/2017  8:59 AM  Recreational Therapist: [name], LRT 07/10/2017  8:59 AM  Other: Tomasita Morrow, P4CC 07/10/2017  8:59 AM  Other:  07/10/2017  8:59 AM  Other: 07/10/2017  8:59 AM    Scribe for Treatment Team: Ida Rogue,  LCSW 07/10/2017 8:59 AM

## 2017-07-10 NOTE — [facility] Counselor (Signed)
[facility] [place].  [facility] who had me fax over info on pt yesterday.

## 2017-07-10 NOTE — [facility] Group Notes (Signed)
[facility] 07/10/2017 10:27 AM   Type of Therapy: Mental Health Association Presentation   Participation Level: Did not attend   Summary of Progress/Problems: Mental Health Association (MHA) Speaker came to talk about his personal journey with mental health. The pt processed ways by which to relate to the speaker. MHA speaker provided handouts and educational information pertaining to groups and services offered by the [facility]. Pt was engaged in speaker's presentation and was receptive to resources provided.      Daryel GeraldRodney Lynessa [name], [place] 07/10/2017 10:27 AM

## 2017-07-10 NOTE — Progress Notes (Signed)
DAR NOTE: Patient presents with anxious affect and mood.  Denies pain, auditory and visual hallucinations.  Rates depression at 0, hopelessness at 0, and anxiety at 0.  Maintained on routine safety checks.  Medications given as prescribed.  Support and encouragement offered as needed.  Attended group and participated.  States goal for today is "discharge."  Patient remained withdrawn and isolates to her room.  Offered no complaint.

## 2017-07-10 NOTE — Progress Notes (Signed)
Pt refused CBG this a.m. provided with encouragement.  

## 2017-07-10 NOTE — Progress Notes (Signed)
Patient walked into the dayroom for group, but walked out soon after arriving.

## 2017-07-10 NOTE — Progress Notes (Addendum)
Recreation Therapy Notes  Date: 3.14.19 Time: 10:00 a.m. Location: 500 [address] Dayroom   Group Topic: Communication   Goal Area(s) Addresses:  Goal 1.1: To improve communication  - Group will communicate with peers during group session    - Group will identify the importance of healthy communication  - Group will answer at least three questions during Recreation Therapy tx  Intervention: Game   Activity: Jenga: Patients played Jenga as normal. When a patient pulled out a [place] piece, based on the color of the skittle, the patient answered a specific question for that color.   Education: Communication, Team-Work   Education Outcome: Acknowledges education  Clinical Observations/Feedback: Patient did not attend   Sheryle HailDarian Tyasia Packard, Recreation Therapy Intern   Sheryle HailDarian Yong Wahlquist 07/10/2017 8:38 AM

## 2017-07-11 NOTE — Progress Notes (Addendum)
RESPONSE    Objective- Pt. is visible in the room, seen pacing in room.Presents with a paranoid/suspicious/labile  affect and mood. Pt states she was sleepy. Proceeded to take meds and went to bed.  Subjective- Denies having any SI/HI/AVH/Pain at this time.Is cooperative and remains safe on the unit.  1:1 interaction in private to establish rapport. Encouragement, education, & support given from staff.    Safety maintained with Q 15 checks. Continue with POC.

## 2017-07-11 NOTE — [facility] Counselor (Signed)
[place] [facility] [facility] [facility].  [facility] who stated that he would have Ms Reede call me back. As the case as been assigned to her.  I explained I had left a message for her yesterday, as instructed, and have not heard back.  He assured me she would get back with me today.  I just called and left another message for her.

## 2017-07-11 NOTE — Progress Notes (Signed)
Pt. Refused vital signs and blood glucose monitoring this morning, support and encouragement given.

## 2017-07-11 NOTE — [facility] Counselor (Signed)
[facility] and there are no beds available.

## 2017-07-11 NOTE — Progress Notes (Signed)
[facility] MD Progress Note  07/11/2017 4:00 PM Sherri [name]  MRN:  [id number] Subjective:    Sherri [name] is a 62 y/o F with history of schizophrenia who was admitted with worening psychosis and paranoia. Pt has a guardian whom is her brother. She has received a second opinion for forced medicationswhich has been renewed multiple times during her admission - most recently on 06/25/17, but it was allowed to expire as pt has been recently adherent to prescribed oral haldol.She remains paranoid, guarded, and mostlyuncooperative with the treatment team. SW team has been exploring option of transfer to [facility] inpatient psychiatry unit, but thatremains to bean unavailable option at this time. Pt was changed from zyprexa to haldol due to lack of efficacy regarding her presenting symptoms and availability of haldol as a long-acting injectable form, and on 07/09/17 she was administered first dose of haldol decanoate. She has been demonstrating improved agitation and ability to interact with the treatment team. Family meeting with her guardian (brother) is scheduled for 07/15/17.  Today upon evaluation, pt was evaluated in her room with female RN staff for pt's comfort. Pt denies any specific concerns aside from requesting for immediate discharge. She denies SI/HI/AH/VH. She is sleeping well and her appetite is good. She denies physical complaints. She is tolerating her medications without difficulty. She states, "I've just been here 48 days and I need to be released immediately. My brother verbally told you to release me, so I think that should count." Informed pt that her guardian had not instructed our team to discharge her and we are planning on upcoming family meeting on 3/19. Pt verbalized understanding, and she had no further questions, comments, or concerns.   Principal Problem: Schizophrenia, disorganized, chronic (HCC) Diagnosis:   Patient Active Problem List   Diagnosis Date Noted  .  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 Schizoaffective disorder, bipolar type (HCC) [F25.0] 05/31/2017  . 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 Bipolar 1 disorder (HCC)   . Hypertension   . Prediabetes   . Schizophrenia (HCC)   . Tachycardia     Past Surgical History:  Procedure Laterality Date  . COLON SURGERY     Family History: History reviewed. No pertinent family history. Family Psychiatric  History: see H&P Social History:  Social History   Substance and Sexual Activity  Alcohol Use No  . Frequency: Never     Social History   Substance and Sexual Activity  Drug Use No    Social History   Socioeconomic History  . Marital status: Divorced    Spouse name: None  . Number of children: None  . Years of education: None  . Highest education level: None  Social Needs  . Financial resource strain: None  . Food insecurity - worry: None  . Food insecurity - inability: None  . Transportation needs - medical: None  . Transportation needs - non-medical: None  Occupational History  . None  Tobacco Use  . Smoking status: Never Smoker  . Smokeless tobacco: Never Used  Substance and Sexual Activity  . Alcohol use: No    Frequency: Never  . Drug use: No  . Sexual activity: None  Other Topics Concern  . None  Social History Narrative  . None   Additional Social History:  Sleep: Good  Appetite:  Good  Current Medications: Current Facility-Administered Medications  Medication Dose Route Frequency Provider Last Rate Last Dose  . acetaminophen (TYLENOL) tablet 650 mg  650 mg Oral Q6H PRN Laveda Abbe, NP      . alum & mag hydroxide-simeth (MAALOX/MYLANTA) 200-200-20 MG/5ML suspension 30 mL  30 mL Oral Q4H PRN Laveda Abbe, NP      . calcium-vitamin D (OSCAL WITH D) 500-200 MG-UNIT per tablet 1 tablet  1 tablet Oral Q breakfast Laveda Abbe, NP   1 tablet at 07/11/17 0747  . haloperidol (HALDOL) tablet 10 mg  10 mg Oral QHS Jolyne Loa T, MD   10 mg at 07/10/17 2243  . haloperidol (HALDOL) tablet 5 mg  5 mg Oral Q6H PRN Micheal Likens, MD       Or  . haloperidol lactate (HALDOL) injection 5 mg  5 mg Intramuscular Q6H PRN Micheal Likens, MD      . haloperidol (HALDOL) tablet 5 mg  5 mg Oral BH-q7a Jolyne Loa T, MD   5 mg at 07/11/17 0630  . haloperidol decanoate (HALDOL DECANOATE) 100 MG/ML injection 100 mg  100 mg Intramuscular Q30 days Jolyne Loa T, MD   100 mg at 07/09/17 1320  . hydrOXYzine (ATARAX/VISTARIL) tablet 25 mg  25 mg Oral Q8H Laveda Abbe, NP   25 mg at 07/11/17 1547  . losartan (COZAAR) tablet 100 mg  100 mg Oral Daily Laveda Abbe, NP   100 mg at 07/11/17 0747  . magnesium hydroxide (MILK OF MAGNESIA) suspension 30 mL  30 mL Oral Daily PRN Laveda Abbe, NP      . metFORMIN (GLUCOPHAGE) tablet 500 mg  500 mg Oral BID WC Laveda Abbe, NP   500 mg at 07/11/17 0747  . traZODone (DESYREL) tablet 50 mg  50 mg Oral QHS PRN Micheal Likens, MD        Lab Results: No results found for this or any previous visit (from the past 48 hour(s)).  Blood Alcohol level:  Lab Results  Component Value Date   ETH <10 05/30/2017   ETH <10 04/11/2017    Metabolic Disorder Labs: No results found for: HGBA1C, MPG No results found for: PROLACTIN No results found for: CHOL, TRIG, HDL, CHOLHDL, VLDL, LDLCALC  Physical Findings: AIMS: Facial and Oral Movements Muscles of Facial Expression: None, normal Lips and Perioral Area: None, normal Jaw: None, normal Tongue: None, normal,Extremity Movements Upper (arms, wrists, hands, fingers): None, normal Lower (legs, knees, ankles, toes): None,  normal, Trunk Movements Neck, shoulders, hips: None, normal, Overall Severity Severity of abnormal movements (highest score from questions above): None, normal Incapacitation due to abnormal movements: None, normal Patient's awareness of abnormal movements (rate only patient's report): No Awareness, Dental Status Current problems with teeth and/or dentures?: No Does patient usually wear dentures?: No  CIWA:  CIWA-Ar Total: 1 COWS:     Musculoskeletal: Strength & Muscle Tone: within normal limits Gait & Station: normal Patient leans: N/A  Psychiatric Specialty Exam: Physical Exam  Nursing note and vitals reviewed.   Review of Systems  Constitutional: Negative for chills and fever.  Respiratory: Negative for cough and shortness of breath.   Cardiovascular: Negative for chest pain.  Gastrointestinal: Negative for abdominal pain, heartburn, nausea and vomiting.  Psychiatric/Behavioral: Negative for depression, hallucinations and suicidal ideas. The patient is not nervous/anxious.     Blood pressure (!) 164/64, pulse (!) 101, temperature 97.8  F (36.6 C), temperature source Oral, resp. rate 16, height 4' 11.45" (1.51 m), weight 62.1 kg (137 lb), SpO2 97 %.Body mass index is 27.25 kg/m.  General Appearance: Casual and Fairly Groomed  Eye Contact:  Good  Speech:  Clear and Coherent and Normal Rate  Volume:  Normal  Mood:  Irritable  Affect:  Congruent and Constricted  Thought Process:  Coherent, Goal Directed and Descriptions of Associations: Loose  Orientation:  Full (Time, Place, and Person)  Thought Content:  Illogical and Paranoid Ideation  Suicidal Thoughts:  No  Homicidal Thoughts:  No  Memory:  Immediate;   Fair Recent;   Fair Remote;   Fair  Judgement:  Poor  Insight:  Lacking  Psychomotor Activity:  Normal  Concentration:  Concentration: Fair  Recall:  FiservFair  Fund of Knowledge:  Fair  Language:  Fair  Akathisia:  No  Handed:    AIMS (if indicated):     Assets:   Communication Skills Resilience  ADL's:  Intact  Cognition:  WNL  Sleep:  Number of Hours: 6.5   Treatment Plan Summary: Daily contact with patient to assess and evaluate symptoms and progress in treatment and Medication management   -Continue inpatient hospitalization  -Schizophrenia - Continuehaldol 5mg  po qAM + haldol 10mg  po qhs - Continue Haldol Decanoate 100mg  IM q30 Days (given on 07/09/17)  -Anxiety/Agitation -Continueatarax 25mg  po q8h prn anxiety -ContinueHaldol 5mg  po/IM q6h prn agitation  - HTN -Continuelosartan 100mg  po qDay  - DMII -Continuemetformin 500mg  po BID  - Insomnia -Continuetrazodone 50mg  po qhs prn insomnia  -Encourage participation in groups and the therapeutic milieu  -Discharge planning will be ongoing  Micheal Likenshristopher T Michalina Calbert, MD 07/11/2017, 4:00 PM

## 2017-07-11 NOTE — [facility] Group Notes (Signed)
LCSW Group Therapy Note  07/11/2017 1:15pm  Type of Therapy and Topic:  Group Therapy:  Feelings around Relapse and Recovery  Participation Level:  Did Not Attend   Description of Group:    Patients in this group will discuss emotions they experience before and after a relapse. They will process how experiencing these feelings, or avoidance of experiencing them, relates to having a relapse. Facilitator will guide patients to explore emotions they have related to recovery. Patients will be encouraged to process which emotions are more powerful. They will be guided to discuss the emotional reaction significant others in their lives may have to their relapse or recovery. Patients will be assisted in exploring ways to respond to the emotions of others without this contributing to a relapse.  Therapeutic Goals: 1. Patient will identify two or more emotions that lead to a relapse for them 2. Patient will identify two emotions that result when they relapse 3. Patient will identify two emotions related to recovery 4. Patient will demonstrate ability to communicate their needs through discussion and/or role plays   Summary of Patient Progress:     Therapeutic Modalities:   Cognitive Behavioral Therapy Solution-Focused Therapy Assertiveness Training Relapse Prevention Therapy   Sherri [name] B Carilyn Woolston, LCSW 07/11/2017 3:39 PM  

## 2017-07-11 NOTE — Progress Notes (Addendum)
Recreation Therapy Notes  Date: 3.15.19 Time: 10:00 a.m. Location: [address] Dayroom  Group Topic: Stress Management  Goal Area(s) Addresses:  Goal 1.1: To reduce stress  -Patient will report feeling a reduction in stress level  -Patient will identify the importance of stress management  -Patient will participate during stress management group treatment    Intervention: Stress Management  Activity: Yoga- Recreation Therapy Intern instructed a yoga group. Patients were in a peaceful environment with soft lighting enhancing patients mood.   Education: Stress Management, Discharge Planning.   Education Outcome: Acknowledges edcuation/In group clarification offered/Needs additional education  Clinical Observations/Feedback:: Patient did not attend    Sheryle HailDarian Filiberto Wamble, Recreation Therapy Intern   Sheryle HailDarian Erielle Gawronski 07/11/2017 10:37 AM

## 2017-07-12 DIAGNOSIS — F0391 Unspecified dementia with behavioral disturbance: Secondary | ICD-10-CM

## 2017-07-12 DIAGNOSIS — R450 Nervousness: Secondary | ICD-10-CM

## 2017-07-12 NOTE — Progress Notes (Signed)
Pt refused CBG and vitals this a.m. provided with encouragement.

## 2017-07-12 NOTE — Progress Notes (Signed)
RESPONSE    Objective-Pt. is visible in theroom, seen resting in bed with eyes open.Presents with aparanoid/suspicious/labileaffect and mood. Pt states she was feeling tired. Proceeded to take meds and went to sleep early.  Subjective-Denies having any SI/HI/AVH/Pain at this time.Is cooperative and remains safe on the unit.  1:1 interaction in private to establish rapport. Encouragement, education, &support given from staff.  Safety maintained with Q 15 checks. Continue with POC.

## 2017-07-12 NOTE — [facility] Group Notes (Signed)
[facility] Group Notes:  (Nursing/MHT/Case Management/Adjunct)  Date:  07/12/2017  Time:  4:49 PM  Type of Therapy:  Psychoeducational Skills  Participation Level:  Did Not Attend     Sherri [name] 07/12/2017, 4:49 PM

## 2017-07-12 NOTE — Progress Notes (Signed)
D: Pt awake in bed on initial approach. Denies SI, HI, AVH and pain when assessed. Presents calm and was logical on assessment "no ma'am, I'm fines, the doctor told me I'm going home on next Tuesday". Pt reports she's eating and sleeping well. Compliant with medication and mouth checks. Denies adverse drug reactions at this time. Continues to refused vitals. Remains isolative to room. Pt did not attend unit groups as scheduled; despite multiple verbal redirections. A: All medications given as scheduled with verbal education and effects monitored. Mouth checks done to ensure compliance. Emotional support and availability provided to pt. Encouraged pt to voice concerns. Safety checks maintained at Q 15 minutes intervals without self harm gestures or outburst to note this shift.  R: Pt receptive to care. Compliant with medications. Denies adverse drug reactions at this time. Pt tolerates all PO intake well. Off unit for meals, returned without issues.

## 2017-07-12 NOTE — Progress Notes (Signed)
Patient ID: Sherri [name], female   DOB: 1955/07/06, 62 y.o.   MRN: [id number]  [facility] MD Progress Note  07/12/2017 3:13 PM Sherri [name]  MRN:  [id number] Subjective:    Sherri [name] is a 62 y/o F admitted with worening psychosis and paranoia.   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  This provider spoke to the brother in the ED in the past who reported this information.  This is the reason she is living in an assisted living due to her cognitive decline after the coma.  Today upon evaluation, pt was evaluated in her room.  She denies SI/HI/AH/VH, and any physical complaints. Patient reports that she is sleeping well and has good appetite. She is tolerating her medications without difficulty. Patient is perseverating on discharge, stating "I was brought here by mistake." However, she is aware that her discharge is likely to be Tuesday to the care of her brother.  Pt verbalized understanding and denies questions, comments, or concerns at this time.   Principal Problem: Psychosis in elderly, with behavioral disturbance Diagnosis:   Patient Active Problem List   Diagnosis Date Noted  . Psychosis in elderly, with behavioral disturbance [F03.91] 04/12/2017    Priority: High  . 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 Schizoaffective disorder, bipolar type (HCC) [F25.0] 05/31/2017   Total Time spent with patient: 30 minutes  Past Psychiatric History: see H&P  Past Medical History:  Past Medical History:  Diagnosis Date  . Bipolar 1 disorder (HCC)   . Hypertension   . Prediabetes   . Schizophrenia (HCC)   . Tachycardia     Past Surgical History:  Procedure Laterality Date  . COLON SURGERY     Family History: History reviewed. No pertinent family history. Family Psychiatric  History: see H&P Social History:  Social History   Substance and Sexual Activity  Alcohol Use No  .  Frequency: Never     Social History   Substance and Sexual Activity  Drug Use No    Social History   Socioeconomic History  . Marital status: Divorced    Spouse name: None  . Number of children: None  . Years of education: None  . Highest education level: None  Social Needs  . Financial resource strain: None  . Food insecurity - worry: None  . Food insecurity - inability: None  . Transportation needs - medical: None  . Transportation needs - non-medical: None  Occupational History  . None  Tobacco Use  . Smoking status: Never Smoker  . Smokeless tobacco: Never Used  Substance and Sexual Activity  . Alcohol use: No    Frequency: Never  . Drug use: No  . Sexual activity: None  Other Topics Concern  . None  Social History Narrative  . None   Additional Social History:                         Sleep: Good  Appetite:  Good  Current Medications: Current Facility-Administered Medications  Medication Dose Route Frequency Provider Last Rate Last Dose  . acetaminophen (TYLENOL) tablet 650 mg  650 mg Oral Q6H PRN Laveda AbbeParks, Laurie Britton, NP      . alum & mag hydroxide-simeth (MAALOX/MYLANTA) 200-200-20 MG/5ML suspension 30 mL  30 mL Oral Q4H PRN Laveda AbbeParks, Laurie Britton, NP      . calcium-vitamin D Ruthell Rummage(OSCAL WITH  D) 500-200 MG-UNIT per tablet 1 tablet  1 tablet Oral Q breakfast Laveda Abbe, NP   1 tablet at 07/12/17 0844  . haloperidol (HALDOL) tablet 10 mg  10 mg Oral QHS Jolyne Loa T, MD   10 mg at 07/11/17 2105  . haloperidol (HALDOL) tablet 5 mg  5 mg Oral Q6H PRN Micheal Likens, MD       Or  . haloperidol lactate (HALDOL) injection 5 mg  5 mg Intramuscular Q6H PRN Micheal Likens, MD      . haloperidol (HALDOL) tablet 5 mg  5 mg Oral Billie Lade T, MD   5 mg at 07/12/17 1610  . haloperidol decanoate (HALDOL DECANOATE) 100 MG/ML injection 100 mg  100 mg Intramuscular Q30 days Jolyne Loa T, MD   100  mg at 07/09/17 1320  . hydrOXYzine (ATARAX/VISTARIL) tablet 25 mg  25 mg Oral Q8H Laveda Abbe, NP   25 mg at 07/12/17 0603  . losartan (COZAAR) tablet 100 mg  100 mg Oral Daily Laveda Abbe, NP   100 mg at 07/12/17 0844  . magnesium hydroxide (MILK OF MAGNESIA) suspension 30 mL  30 mL Oral Daily PRN Laveda Abbe, NP      . metFORMIN (GLUCOPHAGE) tablet 500 mg  500 mg Oral BID WC Laveda Abbe, NP   500 mg at 07/12/17 0844  . traZODone (DESYREL) tablet 50 mg  50 mg Oral QHS PRN Micheal Likens, MD        Lab Results: No results found for this or any previous visit (from the past 48 hour(s)).  Blood Alcohol level:  Lab Results  Component Value Date   ETH <10 05/30/2017   ETH <10 04/11/2017    Metabolic Disorder Labs: No results found for: HGBA1C, MPG No results found for: PROLACTIN No results found for: CHOL, TRIG, HDL, CHOLHDL, VLDL, LDLCALC  Physical Findings: AIMS: Facial and Oral Movements Muscles of Facial Expression: None, normal Lips and Perioral Area: None, normal Jaw: None, normal Tongue: None, normal,Extremity Movements Upper (arms, wrists, hands, fingers): None, normal Lower (legs, knees, ankles, toes): None, normal, Trunk Movements Neck, shoulders, hips: None, normal, Overall Severity Severity of abnormal movements (highest score from questions above): None, normal Incapacitation due to abnormal movements: None, normal Patient's awareness of abnormal movements (rate only patient's report): No Awareness, Dental Status Current problems with teeth and/or dentures?: No Does patient usually wear dentures?: No  CIWA:  CIWA-Ar Total: 1 COWS:     Musculoskeletal: Strength & Muscle Tone: within normal limits Gait & Station: normal Patient leans: N/A  Psychiatric Specialty Exam: Physical Exam  Nursing note and vitals reviewed. Constitutional: She is oriented to person, place, and time. She appears well-developed and  well-nourished.  HENT:  Head: Normocephalic.  Neck: Normal range of motion.  Respiratory: Effort normal.  Musculoskeletal: Normal range of motion.  Neurological: She is alert and oriented to person, place, and time.  Psychiatric: Her speech is normal and behavior is normal. Judgment and thought content normal. Her mood appears anxious. Cognition and memory are normal. She exhibits a depressed mood.    Review of Systems  Constitutional: Negative for chills and fever.  Respiratory: Negative for cough and shortness of breath.   Cardiovascular: Negative for chest pain.  Gastrointestinal: Negative for abdominal pain, heartburn, nausea and vomiting.  Psychiatric/Behavioral: Positive for depression. Negative for hallucinations and suicidal ideas. The patient is nervous/anxious.   All other systems reviewed and are negative.  Blood pressure (!) 164/64, pulse (!) 101, temperature 97.8 F (36.6 C), temperature source Oral, resp. rate 16, height 4' 11.45" (1.51 m), weight 62.1 kg (137 lb), SpO2 97 %.Body mass index is 27.25 kg/m.  General Appearance: Casual and Fairly Groomed  Eye Contact:  Good  Speech:  Clear and Coherent and Normal Rate  Volume:  Normal  Mood:  Irritable  Affect:  Congruent and Constricted  Thought Process:  Coherent, Goal Directed and Descriptions of Associations: Loose  Orientation:  Full (Time, Place, and Person)  Thought Content:  Illogical and Paranoid Ideation  Suicidal Thoughts:  No  Homicidal Thoughts:  No  Memory:  Immediate;   Fair Recent;   Fair Remote;   Fair  Judgement:  Poor  Insight:  Lacking  Psychomotor Activity:  Normal  Concentration:  Concentration: Fair  Recall:  [employer] of Knowledge:  Fair  Language:  Fair  Akathisia:  No  Handed:    AIMS (if indicated):     Assets:  Communication Skills Resilience  ADL's:  Intact  Cognition:  WNL  Sleep:  Number of Hours: 6.75   Treatment Plan Summary: Daily contact with patient to assess and  evaluate symptoms and progress in treatment and Medication management   -Continue inpatient hospitalization  -Schizophrenia - Continuehaldol 5mg  po qAM + haldol 10mg  po qhs - Continue Haldol Decanoate 100mg  IM q30 Days (given on 07/09/17)  -Anxiety/Agitation -Continueatarax 25mg  po q8h prn anxiety -ContinueHaldol 5mg  po/IM q6h prn agitation  - HTN -Continuelosartan 100mg  po qDay  - DMII -Continuemetformin 500mg  po BID  - Insomnia -Continuetrazodone 50mg  po qhs prn insomnia  -Encourage participation in groups and the therapeutic milieu  -Discharge planning will be ongoing  Nanine Means, NP 07/12/2017, 3:13 PM

## 2017-07-12 NOTE — Progress Notes (Signed)
Pt did not attend group. 

## 2017-07-12 NOTE — [facility] Group Notes (Signed)
[facility] Group Notes: (Clinical Social Work)   07/12/2017      Type of Therapy:  Group Therapy   Participation Level:  Did Not Attend despite MHT prompting   Ambrose MantleMareida Grossman-Orr, LCSW 07/12/2017, 3:50 PM

## 2017-07-13 NOTE — Progress Notes (Signed)
D: Pt remains isolative to her room majority of this shift. Presents with flat affect but brightens up on interaction. Pt is more logical and pleasant when engaged. Denies SI, HI, AVH and pain. Pt is preoccupied about d/c "I'm leaving on Tuesday, I need to get ready to leave". Rates her depression, anxiety and hopelessness all 0/10. Attended afternoon group and was active.  A: Emotional support and availability provided to pt. Encouraged pt to voice concerns. All medications administered per MD's order and with verbal education and effects monitored. Q 15 minutes safety checks continued without self gestures or outburst. R: Pt receptive to care. Compliant with medications. Denies adverse drug reactions. Tolerates all PO intake well. Reports she's sleeping well with normal energy and good concentrations level. Off unit for meals, returned without issues.

## 2017-07-13 NOTE — Progress Notes (Signed)
Patient ID: Sherri [name], female   DOB: [birth year], 62 y.o.   MRN: [id number]  Patient ID: Sherri [name], female   DOB: 05/21/1955, 62 y.o.   MRN: [id number]  [facility] MD Progress Note  07/13/2017 9:36 AM Sherri [name]  MRN:  [id number] Subjective:    Sherri [name] is a 62 y/o F admitted with worening psychosis and paranoia.   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  This provider spoke to the brother in the ED in the past who reported this information.  This is the reason she is living in an assisted living due to her cognitive decline after the coma.  Unsure if she had any mental issues prior to her coma event.  Today upon evaluation, pt was evaluated in her room.  She denies SI/HI/AH/VH, and any physical complaints. Patient reports that she is sleeping well and has a fair appetite. She remains focused on discharge.  Pleasant on assessment with no issues voiced.    Principal Problem: Psychosis in elderly, with behavioral disturbance Diagnosis:   Patient Active Problem List   Diagnosis Date Noted  . Psychosis in elderly, with behavioral disturbance [F03.91] 04/12/2017    Priority: High  . 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 Schizoaffective disorder, bipolar type (HCC) [F25.0] 05/31/2017   Total Time spent with patient: 30 minutes  Past Psychiatric History: see H&P  Past Medical History:  Past Medical History:  Diagnosis Date  . Bipolar 1 disorder (HCC)   . Hypertension   . Prediabetes   . Schizophrenia (HCC)   . Tachycardia     Past Surgical History:  Procedure Laterality Date  . COLON SURGERY     Family History: History reviewed. No pertinent family history. Family Psychiatric  History: see H&P Social History:  Social History   Substance and Sexual Activity  Alcohol Use No  . Frequency: Never     Social History   Substance and Sexual Activity  Drug Use No     Social History   Socioeconomic History  . Marital status: Divorced    Spouse name: None  . Number of children: None  . Years of education: None  . Highest education level: None  Social Needs  . Financial resource strain: None  . Food insecurity - worry: None  . Food insecurity - inability: None  . Transportation needs - medical: None  . Transportation needs - non-medical: None  Occupational History  . None  Tobacco Use  . Smoking status: Never Smoker  . Smokeless tobacco: Never Used  Substance and Sexual Activity  . Alcohol use: No    Frequency: Never  . Drug use: No  . Sexual activity: None  Other Topics Concern  . None  Social History Narrative  . None   Additional Social History:                         Sleep: Good  Appetite:  Good  Current Medications: Current Facility-Administered Medications  Medication Dose Route Frequency Provider Last Rate Last Dose  . acetaminophen (TYLENOL) tablet 650 mg  650 mg Oral Q6H PRN Laveda Abbe, NP      . alum & mag hydroxide-simeth (MAALOX/MYLANTA) 200-200-20 MG/5ML suspension 30 mL  30 mL Oral Q4H PRN Laveda Abbe, NP      . calcium-vitamin D (OSCAL WITH D) 500-200 MG-UNIT per tablet  1 tablet  1 tablet Oral Q breakfast Laveda AbbeParks, Laurie Britton, NP   1 tablet at 07/12/17 [id number]  . haloperidol (HALDOL) tablet 10 mg  10 mg Oral QHS Jolyne Loaainville, Christopher T, MD   10 mg at 07/12/17 2119  . haloperidol (HALDOL) tablet 5 mg  5 mg Oral Q6H PRN Micheal Likensainville, Christopher T, MD       Or  . haloperidol lactate (HALDOL) injection 5 mg  5 mg Intramuscular Q6H PRN Micheal Likens[place], Christopher T, MD      . haloperidol (HALDOL) tablet 5 mg  5 mg Oral Billie LadeBH-q7a [place], Christopher T, MD   5 mg at 07/13/17 [id number]  . haloperidol decanoate (HALDOL DECANOATE) 100 MG/ML injection 100 mg  100 mg Intramuscular Q30 days Jolyne Loaain[place], Christopher T, MD   100 mg at 07/09/17 1320  . hydrOXYzine (ATARAX/VISTARIL) tablet 25 mg  25 mg Oral Q8H Laveda AbbeParks,  Laurie Britton, NP   25 mg at 07/13/17 0603  . losartan (COZAAR) tablet 100 mg  100 mg Oral Daily Laveda AbbeParks, Laurie Britton, NP   100 mg at 07/12/17 0844  . magnesium hydroxide (MILK OF MAGNESIA) suspension 30 mL  30 mL Oral Daily PRN Laveda AbbeParks, Laurie Britton, NP      . metFORMIN (GLUCOPHAGE) tablet 500 mg  500 mg Oral BID WC Laveda AbbeParks, Laurie Britton, NP   500 mg at 07/12/17 1654  . traZODone (DESYREL) tablet 50 mg  50 mg Oral QHS PRN Micheal Likens[place], Christopher T, MD        Lab Results: No results found for this or any previous visit (from the past 48 hour(s)).  Blood Alcohol level:  Lab Results  Component Value Date   ETH <10 05/30/2017   ETH <10 04/11/2017    Metabolic Disorder Labs: No results found for: HGBA1C, MPG No results found for: PROLACTIN No results found for: CHOL, TRIG, HDL, CHOLHDL, VLDL, LDLCALC  Physical Findings: AIMS: Facial and Oral Movements Muscles of Facial Expression: None, normal Lips and Perioral Area: None, normal Jaw: None, normal Tongue: None, normal,Extremity Movements Upper (arms, wrists, hands, fingers): None, normal Lower (legs, knees, ankles, toes): None, normal, Trunk Movements Neck, shoulders, hips: None, normal, Overall Severity Severity of abnormal movements (highest score from questions above): None, normal Incapacitation due to abnormal movements: None, normal Patient's awareness of abnormal movements (rate only patient's report): No Awareness, Dental Status Current problems with teeth and/or dentures?: No Does patient usually wear dentures?: No  CIWA:  CIWA-Ar Total: 1 COWS:     Musculoskeletal: Strength & Muscle Tone: within normal limits Gait & Station: normal Patient leans: N/A  Psychiatric Specialty Exam: Physical Exam  Nursing note and vitals reviewed. Constitutional: She is oriented to person, place, and time. She appears well-developed and well-nourished.  HENT:  Head: Normocephalic.  Neck: Normal range of motion.  Respiratory:  Effort normal.  Musculoskeletal: Normal range of motion.  Neurological: She is alert and oriented to person, place, and time.  Psychiatric: Her speech is normal and behavior is normal. Judgment and thought content normal. Her mood appears anxious. Cognition and memory are normal.    Review of Systems  Constitutional: Negative for chills and fever.  Respiratory: Negative for cough and shortness of breath.   Cardiovascular: Negative for chest pain.  Gastrointestinal: Negative for abdominal pain, heartburn, nausea and vomiting.  Psychiatric/Behavioral: Negative for hallucinations, substance abuse and suicidal ideas. The patient is nervous/anxious.   All other systems reviewed and are negative.   Blood pressure (!) 164/64, pulse (!) 101, temperature 97.8 F (  36.6 C), temperature source Oral, resp. rate 16, height 4' 11.45" (1.51 m), weight 62.1 kg (137 lb), SpO2 97 %.Body mass index is 27.25 kg/m.  General Appearance: Casual and Fairly Groomed  Eye Contact:  Good  Speech:  Clear and Coherent and Normal Rate  Volume:  Normal  Mood:  Irritable  Affect:  Congruent and Constricted  Thought Process:  Coherent, Goal Directed and Descriptions of Associations: Loose  Orientation:  Full (Time, Place, and Person)  Thought Content:  Illogical and Paranoid Ideation  Suicidal Thoughts:  No  Homicidal Thoughts:  No  Memory:  Immediate;   Fair Recent;   Fair Remote;   Fair  Judgement:  Poor  Insight:  Lacking  Psychomotor Activity:  Normal  Concentration:  Concentration: Fair  Recall:  [employer] of Knowledge:  Fair  Language:  Fair  Akathisia:  No  Handed:    AIMS (if indicated):     Assets:  Communication Skills Resilience  ADL's:  Intact  Cognition:  WNL  Sleep:  Number of Hours: 6.25   Treatment Plan Summary: Daily contact with patient to assess and evaluate symptoms and progress in treatment and Medication management   -Continue inpatient  hospitalization  -Schizophrenia - Continuehaldol 5mg  po qAM + haldol 10mg  po qhs - Continue Haldol Decanoate 100mg  IM q30 Days (given on 07/09/17)  -Anxiety/Agitation -Continueatarax 25mg  po q8h prn anxiety -ContinueHaldol 5mg  po/IM q6h prn agitation  - HTN -Continuelosartan 100mg  po qDay  - DMII -Continuemetformin 500mg  po BID  - Insomnia -Continuetrazodone 50mg  po qhs prn insomnia  -Encourage participation in groups and the therapeutic milieu  -Discharge planning will be ongoing  Nanine Means, NP 07/13/2017, 9:36 AM

## 2017-07-13 NOTE — [facility] Group Notes (Signed)
[facility]  Date/Time:  07/13/2017  11:00AM-12:00PM  Type of Therapy and Topic:  Group Therapy:  Music and Mood  Participation Level:  Active   Description of Group: In this process group, members listened to a variety of genres of music and identified that different types of music evoke different responses.  Patients were encouraged to identify music that was soothing for them and music that was energizing for them.  Patients discussed how this knowledge can help with wellness and recovery in various ways including managing depression and anxiety as well as encouraging healthy sleep habits.    Therapeutic Goals: 1. Patients will explore the impact of different varieties of music on mood 2. Patients will verbalize the thoughts they have when listening to different types of music 3. Patients will identify music that is soothing to them as well as music that is energizing to them 4. Patients will discuss how to use this knowledge to assist in maintaining wellness and recovery 5. Patients will explore the use of music as a coping skill  Summary of Patient Progress:  At the beginning of group, patient expressed being sleepy, and at the end of group said she was still a little sleepy but more relaxed.  She was not as intrusive in group as she has been at times.  Therapeutic Modalities: Solution Focused Brief Therapy Activity   Ambrose MantleMareida Grossman-Orr, LCSW

## 2017-07-13 NOTE — Plan of Care (Signed)
Patient more cooperative with treatment, attended group today.  Patient less agitated, angry and uncooperative.

## 2017-07-13 NOTE — Progress Notes (Signed)
Pt refused CBG and vitals this a.m. provided with encouragement.    

## 2017-07-14 NOTE — Progress Notes (Signed)
Per CSW request, give pt car keys to pt brother Ronnell Guadalajaralbert Lepage. Keys taken out of pt locker and given to PanolaAlbert on 3/18 at 1206 pm.

## 2017-07-14 NOTE — Plan of Care (Signed)
Patient has not had any outbursts, periods of aggression.  Patient has not engaged in self harm.

## 2017-07-14 NOTE — Progress Notes (Signed)
DAR Note: Pt A & O to self, place and situation. Denies SI, HI, AVH and pain at this time. Remains isolative and guarded. Attended afternoon groups when prompted. Presents preoccupied / focused on d/c, affect is congruent and pt is pleasant on interactions. Compliant with medications when offered. Denies adverse drug reactions when assessed. Tolerates all PO intake well. All medications given as scheduled with verbal education and effects monitored. Emotional support and availability provided to pt throughout this shift. Q 15 minutes safety checks maintained without issues. Pt went off unit for activities and groups,  returned without issues.

## 2017-07-14 NOTE — Progress Notes (Signed)
Psychoeducational Group Note  Date:  07/14/2017 Time:  0913  Group Topic/Focus:  Goals Group:   The focus of this group is to help patients establish daily goals to achieve during treatment and discuss how the patient can incorporate goal setting into their daily lives to aide in recovery.  Participation Level: Did Not Attend  Participation Quality:  Not Applicable  Affect:  Not Applicable  Cognitive:  Not Applicable  Insight:  Not Applicable  Engagement in Group: Not Applicable  Additional Comments:  Pt was asleep and could not attend group this morning.  Chevelle Durr E 07/14/2017, 9:13 AM

## 2017-07-14 NOTE — Progress Notes (Signed)
D: Patient observed isolative to room tonight. Did not attend wrap up group. Patient forwards little and remains suspicious though improvement noted. Patient's affect watchful, mood guarded. Denies pain, physical complaints.   A: Medicated per orders, no prns requested or required. Level III obs in place for safety. Emotional support and reassurance offered though patient minimally receptive. Fall prevention plan in place and reviewed with patient as pt is a moderate fall risk per scoring tool.   R: Patient verbalizes understanding of POC, falls prevention education.  Patient denies SI/HI/AVH and remains safe on level III obs. Will continue to monitor closely.

## 2017-07-14 NOTE — Progress Notes (Signed)
Recreation Therapy Notes   Date: 3.18.19 Time: 10:00 a.m.  Location: [address] Dayroom   Group Topic: Personal Development: Coping Skills   Goal Area(s) Addresses:  Goal 1.1: To improve coping skills  - Group will increase awareness on coping skills  - Group will identify at least three healthy coping skills they have  - Group will be able to identify how coping skills can improve their wellness  Intervention: Craft  Activity: Coping Strategies Fortune Teller: Patients received a Coping Strategies Fortune Teller. Patients were given fifteen minutes to color and list their top 8 healthy coping strategies. Patients then cut out their fortune tellers and followed Recreation Therapy Intern instructions on how to assemble their fortune teller. Once put together, patients had the opportunity to practice their coping strategies by using their fortune tellers with a partner.   Education: Coping Skills   Education Outcome: Acknowledges Education  Clinical Observations/Feedback: Patient did not attend  Sheryle HailDarian Grayland Daisey, Recreation Therapy Intern   Sheryle HailDarian Anastasiya Gowin 07/14/2017 9:06 AM

## 2017-07-14 NOTE — Progress Notes (Signed)
Adult Psychoeducational Group Note  Date:  07/14/2017 Time:  1600  Group Topic/Focus:  Goals Group:   The focus of this group is to help patients establish daily goals to achieve during treatment and discuss how the patient can incorporate goal setting into their daily lives to aide in recovery.  Participation Level:  Did not attend.   Ahleah Simko L

## 2017-07-14 NOTE — Progress Notes (Signed)
Patient ID: Sherri [name], female   DOB: November 26, 1955, 62 y.o.   MRN: [id number]  Patient ID: Sherri [name], female   DOB: 22-Aug-1955, 62 y.o.   MRN: [id number]  [facility] MD Progress Note  07/14/2017 6:25 PM Sherri [name]  MRN:  [id number]  Subjective: Sherri [name] reports, "I'm doing good. I was told by the doctor that I will be going home tomorrow. I can't wait, I have been looking forward to getting out of this place".  Sherri [name] is a 62 y/o F admitted with worening psychosis and paranoia.   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  This provider spoke to the brother in the ED in the past who reported this information.  This is the reason she is living in an assisted living due to her cognitive decline after the coma.  Unsure if she had any mental issues prior to her coma event.  Today upon evaluation, pt was evaluated in her room.  She denies SI/HI/AH/VH, and any physical complaints. Patient reports that she is sleeping well and has a fair appetite. She remains focused on getting discharged tomorrow. Pleasant on assessment with no issues voiced.    Principal Problem: Psychosis in elderly, with behavioral disturbance Diagnosis:   Patient Active Problem List   Diagnosis Date Noted  . 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 Schizoaffective disorder, bipolar type (HCC) [F25.0] 05/31/2017  . 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 Bipolar 1 disorder (HCC)   . Hypertension   . Prediabetes   . Schizophrenia (HCC)   . Tachycardia     Past Surgical History:  Procedure Laterality Date  . COLON SURGERY     Family History: History reviewed. No pertinent family history. Family Psychiatric  History: see H&P Social  History:  Social History   Substance and Sexual Activity  Alcohol Use No  . Frequency: Never     Social History   Substance and Sexual Activity  Drug Use No    Social History   Socioeconomic History  . Marital status: Divorced    Spouse name: None  . Number of children: None  . Years of education: None  . Highest education level: None  Social Needs  . Financial resource strain: None  . Food insecurity - worry: None  . Food insecurity - inability: None  . Transportation needs - medical: None  . Transportation needs - non-medical: None  Occupational History  . None  Tobacco Use  . Smoking status: Never Smoker  . Smokeless tobacco: Never Used  Substance and Sexual Activity  . Alcohol use: No    Frequency: Never  . Drug use: No  . Sexual activity: None  Other Topics Concern  . None  Social History Narrative  . None   Additional Social History:                         Sleep: Good  Appetite:  Good  Current Medications: Current Facility-Administered Medications  Medication Dose Route Frequency Provider Last Rate Last Dose  . acetaminophen (TYLENOL) tablet 650 mg  650 mg Oral Q6H PRN Laveda Abbe, NP      . alum & mag hydroxide-simeth (MAALOX/MYLANTA) 200-200-20 MG/5ML suspension  30 mL  30 mL Oral Q4H PRN Laveda AbbeParks, Laurie Britton, NP      . calcium-vitamin D (OSCAL WITH D) 500-200 MG-UNIT per tablet 1 tablet  1 tablet Oral Q breakfast Laveda AbbeParks, Laurie Britton, NP   1 tablet at 07/14/17 0813  . haloperidol (HALDOL) tablet 10 mg  10 mg Oral QHS Jolyne Loaainville, Christopher T, MD   10 mg at 07/13/17 2157  . haloperidol (HALDOL) tablet 5 mg  5 mg Oral Q6H PRN Micheal Likensainville, Christopher T, MD       Or  . haloperidol lactate (HALDOL) injection 5 mg  5 mg Intramuscular Q6H PRN Micheal Likensainville, Christopher T, MD      . haloperidol (HALDOL) tablet 5 mg  5 mg Oral Billie LadeBH-q7a [place], Christopher T, MD   5 mg at 07/14/17 [id number]  . haloperidol decanoate (HALDOL DECANOATE) 100 MG/ML  injection 100 mg  100 mg Intramuscular Q30 days Jolyne Loaainville, Christopher T, MD   100 mg at 07/09/17 1320  . hydrOXYzine (ATARAX/VISTARIL) tablet 25 mg  25 mg Oral Q8H Laveda AbbeParks, Laurie Britton, NP   25 mg at 07/14/17 1440  . losartan (COZAAR) tablet 100 mg  100 mg Oral Daily Laveda AbbeParks, Laurie Britton, NP   100 mg at 07/14/17 [id number]  . magnesium hydroxide (MILK OF MAGNESIA) suspension 30 mL  30 mL Oral Daily PRN Laveda AbbeParks, Laurie Britton, NP      . metFORMIN (GLUCOPHAGE) tablet 500 mg  500 mg Oral BID WC Laveda AbbeParks, Laurie Britton, NP   500 mg at 07/14/17 1703  . traZODone (DESYREL) tablet 50 mg  50 mg Oral QHS PRN Micheal Likensainville, Christopher T, MD        Lab Results: No results found for this or any previous visit (from the past 48 hour(s)).  Blood Alcohol level:  Lab Results  Component Value Date   ETH <10 05/30/2017   ETH <10 04/11/2017    Metabolic Disorder Labs: No results found for: HGBA1C, MPG No results found for: PROLACTIN No results found for: CHOL, TRIG, HDL, CHOLHDL, VLDL, LDLCALC  Physical Findings: AIMS: Facial and Oral Movements Muscles of Facial Expression: None, normal Lips and Perioral Area: None, normal Jaw: None, normal Tongue: None, normal,Extremity Movements Upper (arms, wrists, hands, fingers): None, normal Lower (legs, knees, ankles, toes): None, normal, Trunk Movements Neck, shoulders, hips: None, normal, Overall Severity Severity of abnormal movements (highest score from questions above): None, normal Incapacitation due to abnormal movements: None, normal Patient's awareness of abnormal movements (rate only patient's report): No Awareness, Dental Status Current problems with teeth and/or dentures?: No Does patient usually wear dentures?: No  CIWA:  CIWA-Ar Total: 1 COWS:     Musculoskeletal: Strength & Muscle Tone: within normal limits Gait & Station: normal Patient leans: N/A  Psychiatric Specialty Exam: Physical Exam  Nursing note and vitals reviewed. Constitutional:  She is oriented to person, place, and time. She appears well-developed and well-nourished.  HENT:  Head: Normocephalic.  Neck: Normal range of motion.  Respiratory: Effort normal.  Musculoskeletal: Normal range of motion.  Neurological: She is alert and oriented to person, place, and time.  Psychiatric: Her speech is normal and behavior is normal. Judgment and thought content normal. Her mood appears anxious. Cognition and memory are normal.    Review of Systems  Constitutional: Negative for chills and fever.  Respiratory: Negative for cough and shortness of breath.   Cardiovascular: Negative for chest pain.  Gastrointestinal: Negative for abdominal pain, heartburn, nausea and vomiting.  Psychiatric/Behavioral: Negative for hallucinations, substance abuse and  suicidal ideas. The patient is nervous/anxious.   All other systems reviewed and are negative.   Blood pressure (!) 164/64, pulse (!) 101, temperature 97.8 F (36.6 C), temperature source Oral, resp. rate 16, height 4' 11.45" (1.51 m), weight 62.1 kg (137 lb), SpO2 97 %.Body mass index is 27.25 kg/m.  General Appearance: Casual and Fairly Groomed  Eye Contact:  Good  Speech:  Clear and Coherent and Normal Rate  Volume:  Normal  Mood:  Irritable  Affect:  Congruent and Constricted  Thought Process:  Coherent, Goal Directed and Descriptions of Associations: Loose  Orientation:  Full (Time, Place, and Person)  Thought Content:  Illogical and Paranoid Ideation  Suicidal Thoughts:  No  Homicidal Thoughts:  No  Memory:  Immediate;   Fair Recent;   Fair Remote;   Fair  Judgement:  Poor  Insight:  Lacking  Psychomotor Activity:  Normal  Concentration:  Concentration: Fair  Recall:  [employer] of Knowledge:  Fair  Language:  Fair  Akathisia:  No  Handed:    AIMS (if indicated):     Assets:  Communication Skills Resilience  ADL's:  Intact  Cognition:  WNL  Sleep:  Number of Hours: 6.75   Treatment Plan Summary: Daily  contact with patient to assess and evaluate symptoms and progress in treatment and Medication management   -Continue inpatient hospitalization.  - Will continue today 07/14/2017 plan as below except where it is noted.  -Schizophrenia - Continuehaldol 5mg  po qAM + haldol 10mg  po qhs - Continue Haldol Decanoate 100mg  IM q30 Days (given on 07/09/17)  -Anxiety/Agitation -Continueatarax 25mg  po q8h prn anxiety -ContinueHaldol 5mg  po/IM q6h prn agitation  - HTN -Continuelosartan 100mg  po qDay  - DMII -Continuemetformin 500mg  po BID  - Insomnia -Continuetrazodone 50mg  po qhs prn insomnia  -Encourage participation in groups and the therapeutic milieu  -Discharge planning will be ongoing  Sherri Stammer, NP, PMHNP, FNP-BC 07/14/2017, 6:25 PMPatient ID: Sherri [name], female   DOB: 1955-12-01, 62 y.o.   MRN: [id number]

## 2017-07-14 NOTE — [facility] Group Notes (Signed)
LCSW Group Therapy Note   07/14/2017 1:15pm   Type of Therapy and Topic:  Group Therapy:  Overcoming Obstacles   Participation Level:  None   Description of Group:    In this group patients will be encouraged to explore what they see as obstacles to their own wellness and recovery. They will be guided to discuss their thoughts, feelings, and behaviors related to these obstacles. The group will process together ways to cope with barriers, with attention given to specific choices patients can make. Each patient will be challenged to identify changes they are motivated to make in order to overcome their obstacles. This group will be process-oriented, with patients participating in exploration of their own experiences as well as giving and receiving support and challenge from other group members.   Therapeutic Goals: 1. Patient will identify personal and current obstacles as they relate to admission. 2. Patient will identify barriers that currently interfere with their wellness or overcoming obstacles.  3. Patient will identify feelings, thought process and behaviors related to these barriers. 4. Patient will identify two changes they are willing to make to overcome these obstacles:      Summary of Patient Progress Came to this CSW's group for the first tiime.  Stayed for 8 minutes.  Left and did not return.  Was not disruptive. Mood appeared to be neutral.     Therapeutic Modalities:   Cognitive Behavioral Therapy Solution Focused Therapy Motivational Interviewing Relapse Prevention Therapy  Ida RogueRodney B Rayshaun Needle, LCSW 07/14/2017 2:58 PM

## 2017-07-14 NOTE — Progress Notes (Signed)
D: Patient observed isolative room. Patient states "I'm doing okay." Does not forward much information, brief eye contact. Superficial however pleasant, polite.  Patient's affect suspicious, preoccupied with congruent mood however is improving. Refused group this evening.  Denies pain, physical complaints.   A: Medicated per orders, no prns required or requested. Level III obs in place for safety. Emotional support offered and self inventory reviewed. Encouraged programming participation. Fall prevention plan in place and reviewed with patient as pt is a moderate fall risk per scoring tool.   R: Patient verbalizes understanding of POC, falls prevention education.  Patient denies SI/HI/AVH and remains safe on level III obs. Will continue to monitor closely. Patient currently asleep.

## 2017-07-15 NOTE — Progress Notes (Signed)
[facility] MD Progress Note  07/15/2017 3:39 PM Sherri [name]  MRN:  [id number] Subjective:    Sherri [name] is a 62 y/o F with history of schizophrenia who was admitted with worening psychosis and paranoia. Pt has a guardian whom is her brother. She has received a second opinion for forced medicationswhich has been renewed multiple times during her admission - most recently on 06/25/17, but it was allowed to expire as pt has been recently adherent to prescribed oral haldol.She remains paranoid, guarded, and mostlyuncooperative with the treatment team. SW team has been exploring option of transfer to [facility] inpatient psychiatry unit, but thatremains to bean unavailable option at this time. Pt was changed from zyprexa to haldol due to lack of efficacy regarding her presenting symptoms and availability of haldol as a long-acting injectable form, and on 07/09/17 she was administered first dose of haldol decanoate. She has been demonstrating improved agitation and ability to interact with the treatment team.   Today upon evaluation, pt was evaluated in the office with her brother (guardian) present for family meeting. Pt denies any specific complaints. She is sleeping well and her appetite is good. She is tolerating her medications without side effects. She denies SI/HI/AH/VH. Pt requests to discharge back to her assisted living facility, and we discussed this option with her guardian. Pt's guardian reviewed expectations for behavior and medication/appointment/vitals adherence after discharge, and pt was in agreement. We were in agreement to tentatively plan for discharge tomorrow after coordination with pt's assisted living facility. Pt and pt's brother had no further questions, comments, or concerns.   Principal Problem: Schizophrenia, disorganized, chronic (HCC) Diagnosis:   Patient Active Problem List   Diagnosis Date Noted  . 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 Schizoaffective disorder, bipolar type (HCC) [F25.0] 05/31/2017  . 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 Bipolar 1 disorder (HCC)   . Hypertension   . Prediabetes   . Schizophrenia (HCC)   . Tachycardia     Past Surgical History:  Procedure Laterality Date  . COLON SURGERY     Family History: History reviewed. No pertinent family history. Family Psychiatric  History: see H&P Social History:  Social History   Substance and Sexual Activity  Alcohol Use No  . Frequency: Never     Social History   Substance and Sexual Activity  Drug Use No    Social History   Socioeconomic History  . Marital status: Divorced    Spouse name: None  . Number of children: None  . Years of education: None  . Highest education level: None  Social Needs  . Financial resource strain: None  . Food insecurity - worry: None  . Food insecurity - inability: None  . Transportation needs - medical: None  . Transportation needs - non-medical: None  Occupational History  . None  Tobacco Use  . Smoking status: Never Smoker  . Smokeless tobacco: Never Used  Substance and Sexual Activity  . Alcohol use: No    Frequency: Never  . Drug use: No  . Sexual activity: None  Other Topics Concern  . None  Social History Narrative  . None   Additional Social History:  Sleep: Good  Appetite:  Good  Current Medications: Current Facility-Administered Medications  Medication Dose Route Frequency Provider Last Rate Last Dose  . acetaminophen (TYLENOL) tablet 650 mg  650 mg Oral Q6H PRN Laveda AbbeParks, Laurie Britton, NP      . alum & mag hydroxide-simeth (MAALOX/MYLANTA) 200-200-20 MG/5ML suspension 30 mL  30 mL Oral Q4H PRN Laveda AbbeParks, Laurie Britton, NP      . calcium-vitamin D (OSCAL WITH D) 500-200  MG-UNIT per tablet 1 tablet  1 tablet Oral Q breakfast Laveda AbbeParks, Laurie Britton, NP   1 tablet at 07/15/17 0810  . haloperidol (HALDOL) tablet 10 mg  10 mg Oral QHS Jolyne Loaainville, Abdishakur Gottschall T, MD   10 mg at 07/14/17 2115  . haloperidol (HALDOL) tablet 5 mg  5 mg Oral Q6H PRN Micheal Likensainville, Gresham Caetano T, MD       Or  . haloperidol lactate (HALDOL) injection 5 mg  5 mg Intramuscular Q6H PRN Micheal Lik[place], Deztinee Lohmeyer T, MD      . haloperidol (HALDOL) tablet 5 mg  5 mg Oral Billie LadeBH-q7a Zyier Dykema T, MD   5 mg at 07/15/17 [id number]  . haloperidol decanoate (HALDOL DECANOATE) 100 MG/ML injection 100 mg  100 mg Intramuscular Q30 days Jolyne Loaain[place], Ciel Chervenak T, MD   100 mg at 07/09/17 1320  . hydrOXYzine (ATARAX/VISTARIL) tablet 25 mg  25 mg Oral Q8H Laveda AbbeParks, Laurie Britton, NP   25 mg at 07/15/17 1416  . losartan (COZAAR) tablet 100 mg  100 mg Oral Daily Laveda AbbeParks, Laurie Britton, NP   100 mg at 07/15/17 0810  . magnesium hydroxide (MILK OF MAGNESIA) suspension 30 mL  30 mL Oral Daily PRN Laveda AbbeParks, Laurie Britton, NP      . metFORMIN (GLUCOPHAGE) tablet 500 mg  500 mg Oral BID WC Laveda AbbeParks, Laurie Britton, NP   500 mg at 07/15/17 0810  . traZODone (DESYREL) tablet 50 mg  50 mg Oral QHS PRN Micheal Likensainville, Leyda Vanderwerf T, MD        Lab Results: No results found for this or any previous visit (from the past 48 hour(s)).  Blood Alcohol level:  Lab Results  Component Value Date   ETH <10 05/30/2017   ETH <10 04/11/2017    Metabolic Disorder Labs: No results found for: HGBA1C, MPG No results found for: PROLACTIN No results found for: CHOL, TRIG, HDL, CHOLHDL, VLDL, LDLCALC  Physical Findings: AIMS: Facial and Oral Movements Muscles of Facial Expression: None, normal Lips and Perioral Area: None, normal Jaw: None, normal Tongue: None, normal,Extremity Movements Upper (arms, wrists, hands, fingers): None, normal Lower (legs, knees, ankles, toes): None, normal, Trunk Movements Neck, shoulders, hips: None, normal, Overall  Severity Severity of abnormal movements (highest score from questions above): None, normal Incapacitation due to abnormal movements: None, normal Patient's awareness of abnormal movements (rate only patient's report): No Awareness, Dental Status Current problems with teeth and/or dentures?: No Does patient usually wear dentures?: No  CIWA:  CIWA-Ar Total: 1 COWS:     Musculoskeletal: Strength & Muscle Tone: within normal limits Gait & Station: normal Patient leans: N/A  Psychiatric Specialty Exam: Physical Exam  Nursing note and vitals reviewed.   Review of Systems  Constitutional: Negative for chills and fever.  Respiratory: Negative for cough and shortness of breath.   Cardiovascular: Negative for chest pain.  Gastrointestinal: Negative for abdominal pain, heartburn, nausea and vomiting.  Psychiatric/Behavioral: Negative for depression, hallucinations and suicidal ideas. The patient is not nervous/anxious and does not have insomnia.     Blood pressure (!) 173/80,  pulse (!) 106, temperature 97.8 F (36.6 C), temperature source Oral, resp. rate 16, height 4' 11.45" (1.51 m), weight 62.1 kg (137 lb), SpO2 97 %.Body mass index is 27.25 kg/m.  General Appearance: Casual and Fairly Groomed  Eye Contact:  Good  Speech:  Clear and Coherent and Normal Rate  Volume:  Normal  Mood:  Euthymic  Affect:  Constricted  Thought Process:  Coherent and Goal Directed  Orientation:  Full (Time, Place, and Person)  Thought Content:  Logical  Suicidal Thoughts:  No  Homicidal Thoughts:  No  Memory:  Immediate;   Fair Recent;   Fair Remote;   Fair  Judgement:  Fair  Insight:  Lacking  Psychomotor Activity:  Normal  Concentration:  Concentration: Fair  Recall:  [employer] of Knowledge:  Fair  Language:  Fair  Akathisia:  No  Handed:    AIMS (if indicated):     Assets:  Resilience  ADL's:  Intact  Cognition:  WNL  Sleep:  Number of Hours: 6.75    Treatment Plan Summary: Daily  contact with patient to assess and evaluate symptoms and progress in treatment and Medication management   -Continue inpatient hospitalization  -Schizophrenia - Continuehaldol 5mg  po qAM + haldol 10mg  po qhs -ContinueHaldol Decanoate 100mg  IM q30 Days (given on 07/09/17)  -Anxiety/Agitation -Continueatarax 25mg  po q8h prn anxiety -ContinueHaldol 5mg  po/IM q6h prn agitation  - HTN -Continuelosartan 100mg  po qDay  - DMII -Continuemetformin 500mg  po BID  - Insomnia -Continuetrazodone 50mg  po qhs prn insomnia  -Encourage participation in groups and the therapeutic milieu  -Discharge planning will be ongoing  Micheal Likens, MD 07/15/2017, 3:39 PM

## 2017-07-15 NOTE — Progress Notes (Signed)
Did not attend group 

## 2017-07-15 NOTE — Progress Notes (Signed)
DAR NOTE: Patient presents with anxious affect and mood.  Denies suicidal thoughts, pain, auditory and visual hallucinations.  Rates depression at 0, hopelessness at 0, and anxiety at 0.  Maintained on routine safety checks.  Medications given as prescribed.  Support and encouragement offered as needed.  Attended group and participated.  States goal for today is "discharge."  Patient is isolative and forward little during assessment.  Offered no complaint.

## 2017-07-15 NOTE — Progress Notes (Addendum)
Recreation Therapy Notes Date: 3.19.19 Time: 10:00 a.m.  Location: [address] Day[address]   Group Topic: Self-Expression, PharmacologistCoping Skills, Leisure Education   Goal Area(s) Addresses:  - Patient will identify the importance of self-expression  - Patient will identify how to express themselves  - Patient will identify how art can be used as a Associate Professorcoping skill  - Patient will report enjoyment and feeling of relaxation from activity  - Patient will participate in Recreation Therapy group treatment   Intervention: Art   Activity: Expressive Arts: Patients had the opportunity to express themselves through the use of art by painting their feelings or thoughts on a canvas   Education: Self-Expression, PharmacologistCoping Skills, Leisure Education, Stress Management   Education Outcome: Acknowledges education  Clinical Observations/Feedback: Patient did not attend   Sheryle HailDarian Jozlyn Schatz, Recreation Therapy Intern  Sheryle HailDarian Codi Kertz 07/15/2017 11:03 AM

## 2017-07-15 NOTE — [place] FL2 (Addendum)
MEDICAID FL2 LEVEL OF CARE SCREENING TOOL     IDENTIFICATION  Patient Name: Sherri [name] Birthdate: June 02, 1955 Sex: female Admission Date (Current Location): 06/03/2017  [facility] and [place] Number:  Producer, television/film/video and Address:  [facility])      Provider Number: [id number]  Attending Physician Name and Address:  Micheal Likens*  Relative Name and Phone Number:  Orene Desanctis, [phone] (legal gauradian))    Current Level of Care: Hospital Recommended Level of Care: Assisted Living Facility  [facility] Prior Approval Number:    Date Approved/Denied:   PASRR Number:    Discharge Plan: Domiciliary (Rest home)    Current Diagnoses: Patient Active Problem List   Diagnosis Date Noted  . Schizophrenia, disorganized, chronic (HCC) 06/03/2017  . Schizoaffective disorder, bipolar type (HCC) 05/31/2017  . Psychosis in elderly, with behavioral disturbance 04/12/2017    Orientation RESPIRATION BLADDER Height & Weight     Self, Time, Situation, Place  Normal Continent Weight: 137 lb (62.1 kg) Height:  4' 11.45" (151 cm)  BEHAVIORAL SYMPTOMS/MOOD NEUROLOGICAL BOWEL NUTRITION STATUS  Verbally abusive(No outbursts since 3/12) (None) Continent (Regular Diet)  AMBULATORY STATUS COMMUNICATION OF NEEDS Skin   Independent Verbally Normal                       Personal Care Assistance Level of Assistance  Bathing, Feeding, Dressing Bathing Assistance: Independent Feeding assistance: Independent Dressing Assistance: Independent     Functional Limitations Info  (None)          SPECIAL CARE FACTORS FREQUENCY  (None)                    Contractures Contractures Info: Not present    Additional Factors Info  Psychotropic     Psychotropic Info: Prescribed medications         Current Medications (07/15/2017):  This is the current hospital active medication list Current Facility-Administered  Medications  Medication Dose Route Frequency Provider Last Rate Last Dose  . acetaminophen (TYLENOL) tablet 650 mg  650 mg Oral Q6H PRN Laveda Abbe, NP      . alum & mag hydroxide-simeth (MAALOX/MYLANTA) 200-200-20 MG/5ML suspension 30 mL  30 mL Oral Q4H PRN Laveda Abbe, NP      . calcium-vitamin D (OSCAL WITH D) 500-200 MG-UNIT per tablet 1 tablet  1 tablet Oral Q breakfast Laveda Abbe, NP   1 tablet at 07/15/17 0810  . haloperidol (HALDOL) tablet 10 mg  10 mg Oral QHS Jolyne Loa T, MD   10 mg at 07/14/17 2115  . haloperidol (HALDOL) tablet 5 mg  5 mg Oral Q6H PRN Micheal Likens, MD       Or  . haloperidol lactate (HALDOL) injection 5 mg  5 mg Intramuscular Q6H PRN Micheal Likens, MD      . haloperidol (HALDOL) tablet 5 mg  5 mg Oral Billie Lade T, MD   5 mg at 07/15/17 4782  . haloperidol decanoate (HALDOL DECANOATE) 100 MG/ML injection 100 mg  100 mg Intramuscular Q30 days Jolyne Loa T, MD   100 mg at 07/09/17 1320  . hydrOXYzine (ATARAX/VISTARIL) tablet 25 mg  25 mg Oral Q8H Laveda Abbe, NP   25 mg at 07/15/17 1416  . losartan (COZAAR) tablet 100 mg  100 mg Oral Daily Laveda Abbe, NP   100 mg at 07/15/17 0810  . magnesium hydroxide (MILK  OF MAGNESIA) suspension 30 mL  30 mL Oral Daily PRN Laveda AbbeParks, Laurie Britton, NP      . metFORMIN (GLUCOPHAGE) tablet 500 mg  500 mg Oral BID WC Laveda AbbeParks, Laurie Britton, NP   500 mg at 07/15/17 0810  . traZODone (DESYREL) tablet 50 mg  50 mg Oral QHS PRN Micheal Likensainville, Christopher T, MD         Discharge Medications: Please see discharge summary for a list of discharge medications. acetaminophen 500 MG tablet Commonly known as:  TYLENOL Take 2 tablets (1,000 mg total) by mouth every 8 (eight) hours as needed for mild pain.  Indication:  Fever, Pain   calcium-vitamin D 500-200 MG-UNIT tablet Commonly known as:  OSCAL WITH D Take 1 tablet by mouth  daily with breakfast. For bone health Start taking on:  07/17/2017 What changed:  additional instructions  Indication:  Low Amount of Calcium in the Blood   haloperidol 10 MG tablet Commonly known as:  HALDOL Take 1 tablet (10 mg total) by mouth at bedtime. For mood control  Indication:  Mood control   haloperidol 5 MG tablet Commonly known as:  HALDOL Take 1 tablet (5 mg total) by mouth every morning. For mood control Start taking on:  07/17/2017  Indication:  Mood control   haloperidol decanoate 100 MG/ML injection Commonly known as:  HALDOL DECANOATE Inject 1 mL (100 mg total) into the muscle every 30 (thirty) days. (Due on 08-08-17): For mood control Start taking on:  08/08/2017  Indication:  Mood control   hydrOXYzine 25 MG tablet Commonly known as:  ATARAX/VISTARIL Take 1 tablet (25 mg total) by mouth every 8 (eight) hours. For anxiety What changed:    when to take this  reasons to take this  additional instructions  Indication:  Feeling Anxious   losartan 100 MG tablet Commonly known as:  COZAAR Take 1 tablet (100 mg total) by mouth daily. For high blood pressure Start taking on:  07/17/2017 What changed:  additional instructions  Indication:  High Blood Pressure Disorder   metFORMIN 500 MG tablet Commonly known as:  GLUCOPHAGE Take 1 tablet (500 mg total) by mouth 2 (two) times daily with a meal. For diabetes management What changed:  additional instructions  Indication:  Type 2 Diabetes   traZODone 50 MG tablet Commonly known as:  DESYREL Take 1 tablet (50 mg total) by mouth at bedtime as needed for sleep.        Relevant Imaging Results:  Relevant Lab Results:   Additional Information    Ida Rogueodney B Lucio Litsey, LCSW

## 2017-07-15 NOTE — Tx Team (Signed)
Interdisciplinary Treatment and Diagnostic Plan Update  07/15/2017 Time of Session: 10:43 AM  Sherri [name] MRN: [id number]  Principal Diagnosis: Psychosis in elderly, with behavioral disturbance  Secondary Diagnoses: Principal Problem:   Psychosis in elderly, with behavioral disturbance   Current Medications:  Current Facility-Administered Medications  Medication Dose Route Frequency Provider Last Rate Last Dose  . acetaminophen (TYLENOL) tablet 650 mg  650 mg Oral Q6H PRN Laveda Abbe, NP      . alum & mag hydroxide-simeth (MAALOX/MYLANTA) 200-200-20 MG/5ML suspension 30 mL  30 mL Oral Q4H PRN Laveda Abbe, NP      . calcium-vitamin D (OSCAL WITH D) 500-200 MG-UNIT per tablet 1 tablet  1 tablet Oral Q breakfast Laveda Abbe, NP   1 tablet at 07/15/17 0810  . haloperidol (HALDOL) tablet 10 mg  10 mg Oral QHS Jolyne Loa T, MD   10 mg at 07/14/17 2115  . haloperidol (HALDOL) tablet 5 mg  5 mg Oral Q6H PRN Micheal Likens, MD       Or  . haloperidol lactate (HALDOL) injection 5 mg  5 mg Intramuscular Q6H PRN Micheal Likens, MD      . haloperidol (HALDOL) tablet 5 mg  5 mg Oral Billie Lade T, MD   5 mg at 07/15/17 4098  . haloperidol decanoate (HALDOL DECANOATE) 100 MG/ML injection 100 mg  100 mg Intramuscular Q30 days Jolyne Loa T, MD   100 mg at 07/09/17 1320  . hydrOXYzine (ATARAX/VISTARIL) tablet 25 mg  25 mg Oral Q8H Laveda Abbe, NP   25 mg at 07/15/17 1191  . losartan (COZAAR) tablet 100 mg  100 mg Oral Daily Laveda Abbe, NP   100 mg at 07/15/17 0810  . magnesium hydroxide (MILK OF MAGNESIA) suspension 30 mL  30 mL Oral Daily PRN Laveda Abbe, NP      . metFORMIN (GLUCOPHAGE) tablet 500 mg  500 mg Oral BID WC Laveda Abbe, NP   500 mg at 07/15/17 0810  . traZODone (DESYREL) tablet 50 mg  50 mg Oral QHS PRN Micheal Likens, MD        PTA  Medications: Medications Prior to Admission  Medication Sig Dispense Refill Last Dose  . acetaminophen (TYLENOL) 500 MG tablet Take 1,000 mg by mouth every 8 (eight) hours as needed for mild pain.   unk  . calcium-vitamin D (OSCAL WITH D) 500-200 MG-UNIT tablet Take 1 tablet by mouth daily with breakfast.   Past Week at Unknown time  . chlorhexidine (PERIDEX) 0.12 % solution Use as directed 15 mLs in the mouth or throat 2 (two) times daily.   Past Week at Unknown time  . cholecalciferol (VITAMIN D) 1000 units tablet Take 2,000 Units by mouth daily.   Past Week at Unknown time  . Cinnamon 500 MG capsule Take 500 mg by mouth daily.   Past Week at Unknown time  . Cranberry (CRANBERRY CONCENTRATE) 500 MG CAPS Take 1 capsule by mouth daily.   Past Week at Unknown time  . ferrous sulfate 325 (65 FE) MG tablet Take 325 mg by mouth daily with breakfast.   Past Week at Unknown time  . guaifenesin (HUMIBID E) 400 MG TABS tablet Take 400 mg by mouth every 8 (eight) hours as needed (mucus relief).   unk  . guaiFENesin-dextromethorphan (ROBITUSSIN DM) 100-10 MG/5ML syrup Take 10 mLs by mouth every 4 (four) hours as needed for cough.   unk  .  hydrOXYzine (ATARAX/VISTARIL) 25 MG tablet Take 1 tablet (25 mg total) by mouth every 6 (six) hours as needed for anxiety. (Patient taking differently: Take 25 mg by mouth every 8 (eight) hours. ) 30 tablet 0 Past Week at Unknown time  . loratadine (CLARITIN) 10 MG tablet Take 10 mg by mouth daily as needed for allergies.   unk at Unknown time  . losartan (COZAAR) 100 MG tablet Take 100 mg by mouth daily.   Past Week at Unknown time  . metFORMIN (GLUCOPHAGE) 500 MG tablet Take 500 mg by mouth 2 (two) times daily with a meal.   Past Week at Unknown time  . Multiple Vitamin (MULTIVITAMIN WITH MINERALS) TABS tablet Take 1 tablet by mouth daily.   Past Week at Unknown time  . risperiDONE (RISPERDAL) 0.5 MG tablet Take 1 tablet (0.5 mg total) by mouth 2 (two) times daily. 60  tablet 0 Past Week at Unknown time    Treatment Modalities: Medication Management, Group therapy, Case management,  1 to 1 session with clinician, Psychoeducation, Recreational therapy.  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  Therapeutic Interventions: 1 to 1 sessions, Unit Group sessions and Medication administration.  Evaluation of Outcomes: Adequate for Discharge  Physician Treatment Plan for Secondary Diagnosis: Principal Problem:   Psychosis in elderly, with behavioral disturbance  Long Term Goal(s): Improvement in symptoms so as ready for discharge  Short Term Goals: Ability to identify and develop effective coping behaviors will improve Compliance with prescribed medications will improve Ability to identify and develop effective coping behaviors will improve  Medication Management: Evaluate patient's response, side effects, and tolerance of medication regimen.  Therapeutic Interventions: 1 to 1 sessions, Unit Group sessions and Medication administration.  Evaluation of Outcomes: Adequate for Discharge   2/13: Pt remains paranoid, delusional, agitated and aggressive at time. She has poor insight. She has been refusing medications. We will change from Invega to zyprexa (PO or IM if pt refuses PO).   -DiscontinueInvega 6mg  po qDay             - Start zyprexa 5mg  po/IM qAM + 10mg   po/IM qhs (only give IM if pt refuses oral form)  -Anxiety/Agitation -Continuegabapentin 200mg  po BID -Continueatarax 25mg  po q8h prn anxiety -Continueagitation protocol with zydis/ativan/geodon  - HTN -Continuelosartan 100mg  po qDay  - DMII -Continuemetformin 500mg  po BID  2/21: Pt remains delusional, pressured, labile, and paranoid. She is unwilling to engage with treatment team. We will continue her current medication regimen and continue to investigate possibility of transfer to [place]. Pt was rejected yesterday at [facility] due to their level of acuity.  - Second opinion for forced medications last renewed on 2/18 by Dr. Jackquline Berlin  -Schizophrenia -Continuezyprexa 5mg  po/IM qAM + 10mg  po/IM qhs (only give IM if pt refuses oral form)  -Anxiety/Agitation -Continuegabapentin 200mg  po BID -Continueatarax 25mg  po q8h prn anxiety -Continueagitation protocol with zydis/ativan/geodon  - HTN -Continuelosartan 100mg  po qDay  - DMII -Continuemetformin 500mg  po BID  2/26: Second opinion for forced medications last renewed on 2/18 by Dr.  Izediuno - Pt now accepting of oral zyprexa at bedtime, but we will consider renewal of second opinion if pt refuses long-acting form of antipsychotic  -Schizophrenia -Continuezyprexa 5mg  po/IM qAM + 10mg  po/IM qhs (only give IM if pt refuses oral form)  -Anxiety/Agitation -Continuegabapentin 200mg  po BID -Continueatarax 25mg  po q8h prn anxiety -Continueagitation protocol with zydis/ativan/geodon  - HTN -Continuelosartan 100mg  po qDay  - DMII -Continuemetformin 500mg  po BID  - Insomnia -Continuetrazodone 50mg  po qhs prn insomnia  2/28: -Schizophrenia             - DC scheduled Invega  Sustenna (pt never received Sustenna injection) - Start Haldol 5mg  po/IM BID (only give IM if pt refuses PO form)  -Anxiety/Agitation -Continuegabapentin 200mg  po BID -Continueatarax 25mg  po q8h prn anxiety -Discontinueagitation protocol with zydis/ativan/geodon            - Start Haldol 5mg  po/IM q6h prn agitation  3/5: pt is interviewed in the doorway of the office as she refuses to enter the room with female NP staff present (for pt comfort). Pt shares, "I'm fine." Pt answers a few other basic questions including denying any physical complaints and denying SI/HI/AH/VH, but she is otherwise uncooperative with the interview. She interrupts this provider multiple times and demands immediate discharge, explaining that her uncle has filed a Architect the hospital. Pt continues to address this provider as "Perini" and references to me as a "CNA" whom has been working to keep her in the hospital  - Second opinion for forced medications last renewed on 06/25/17  -Schizophrenia - ContinueHaldol 5mg  po/IM BID (only give IM if pt refuses PO form)  -Anxiety/Agitation -Continueatarax 25mg  po q8h prn anxiety -ContinueHaldol 5mg  po/IM q6h prn agitation  - HTN -Continuelosartan 100mg  po qDay  - DMII -Continuemetformin 500mg  po BID  3/7:  "Are you the one to sign me out of this hospital. I have been in this place x 30 days, it was a 10 days kind of stay. Now, I'm held against my will. I know you got the qualification to sign me out here, you just don't. That [place] guy is not my doctor". - Second opinion for forced medications last renewed on 06/25/17  -Schizophrenia - Continue haldol 5mg  po/IM qAM + haldol 10mg  po/IM qhs (give IM if pt refuses oral form).   -Anxiety/Agitation -Continueatarax 25mg  po q8h prn  anxiety -ContinueHaldol 5mg  po/IM q6h prn agitation  3/12: Sherri [name] addresses this provider as a physician today as contrasted to previous encounters when she asserted that this provider was a CNA whom had been working against her for the past several months. Pt was asked directly about that delusional content, and she explains, "Your name was on a watch list several months ago, and I think you know what that is about."  After giving pt morning medication pt appeared to be cheeking her medication, pt appeared to put the medications in her upper lip and immediately went back to her room.   3/14:  When topic of long-acting injectable was brought up, pt grew increasingly agitated and started making delusional statements such as addressing this provider as "Mr. Perini" which she had previously detailed as delusional belief that this provider has known the patient for several years and has been working against her.   Collateral information was obtained from pt's guardian (brother) regarding consent for long-acting injectable form. Pt's guardian was in agreement with plan to administer haldol decanoate even against wishes of patient. He is planning to visit  the [place] from [place] next week. SW team [place] [facility] the [facility] [place] [facility] [place].   - Continuehaldol 5mg  po qAM + haldol 10mg  po qhs             - Start Haldol Decanoate 100mg  IM q30 Days      RN Treatment Plan for Primary Diagnosis: Psychosis in elderly, with behavioral disturbance Long Term Goal(s): Knowledge of disease and therapeutic regimen to maintain health will improve  Short Term Goals: Ability to participate in decision making will improve, Ability to identify and develop effective coping behaviors will improve and Compliance with prescribed medications will improve  Medication Management: RN will administer medications as ordered by provider, will assess and evaluate patient's  response and provide education to patient for prescribed medication. RN will report any adverse and/or side effects to prescribing provider.  Therapeutic Interventions: 1 on 1 counseling sessions, Psychoeducation, Medication administration, Evaluate responses to treatment, Monitor vital signs and CBGs as ordered, Perform/monitor CIWA, COWS, AIMS and Fall Risk screenings as ordered, Perform wound care treatments as ordered.  Evaluation of Outcomes: Adequate for Discharge   LCSW Treatment Plan for Primary Diagnosis: Psychosis in elderly, with behavioral disturbance Long Term Goal(s): Safe transition to appropriate next level of care at discharge, Engage patient in therapeutic group addressing interpersonal concerns.  Short Term Goals: Engage patient in aftercare planning with referrals and resources, Facilitate acceptance of mental health diagnosis and concerns, Identify triggers associated with mental health/substance abuse issues and Increase skills for wellness and recovery  Therapeutic Interventions: Assess for all discharge needs, 1 to 1 time with Social worker, Explore available resources and support systems, Assess for adequacy in community support network, Educate family and significant other(s) on suicide prevention, Complete Psychosocial Assessment, Interpersonal group therapy.  Evaluation of Outcomes: Adequate for Discharge   Progress in Treatment: Attending groups: No Participating in groups: No Taking medication as prescribed: Yes Toleration of medication: Yes, no side effects reported at this time Family/Significant other contact made: Aaron EdelmanYes, [name] LePage [phone] (Brother/Legal Guardian) Patient understands diagnosis: No, limited insight Discussing patient identified problems/goals with staff: Yes Medical problems stabilized or resolved: Yes Denies suicidal/homicidal ideation: Yes Issues/concerns per patient self-inventory: None Other: N/A  New problem(s) identified:  None identified at this time.   New Short Term/Long Term Goal(s): "I'm not supposed to be here.  I am supposed to be [facility] [place] [facility] [place]".   Discharge Plan or Barriers: Upon discharge pt will return to [facility] and [facility] [place].   3/8:  2nd IVC hearing today-asked for an additional 21 days [again].  [facility]. Called today and they are now on diversion, so not taking any new referrals  3/11:  Have been in contact with VA daily.  If not on diversion, their beds have been full.  3/19:  [facility]  Reason for Continuation of Hospitalization:    Estimated Length of Stay: Kennon PortelaLikley d/c tomorrow  Attendees: Patient:  07/15/2017  10:43 AM  Physician: Jolyne Loahristopher [place], MD 07/15/2017  10:43 AM  Nursing: Liborio NixonPatrice White RN 07/15/2017  10:43 AM  RN Care Manager: Onnie BoerJennifer Clark, RN 07/15/2017  10:43 AM  Social Worker: Richelle Itood Emannuel Vise, LCSW; Melba CoonAngel Webster, Social Work Intern 07/15/2017  10:43 AM  Recreational Therapist: Caroll RancherMarjette [name], LRT 07/15/2017  10:43 AM  Other: Tomasita Morrowelora Sutton, [facility] 07/15/2017  10:43 AM  Other:  07/15/2017  10:43 AM  Other: 07/15/2017  10:43 AM    Scribe for Treatment Team: Ida Rogue, LCSW 07/15/2017 10:43 AM

## 2017-07-16 MED ORDER — HYDROXYZINE HCL 25 MG PO TABS: 25.0000 mg | ORAL_TABLET | Freq: Three times a day (TID) | ORAL | 0 refills | Status: AC

## 2017-07-16 MED ORDER — HALOPERIDOL DECANOATE 100 MG/ML IM SOLN: 100.0000 mg | INTRAMUSCULAR | 0 refills | Status: AC

## 2017-07-16 MED ORDER — LOSARTAN POTASSIUM 100 MG PO TABS: 100.0000 mg | ORAL_TABLET | Freq: Every day | ORAL | 0 refills | Status: DC

## 2017-07-16 MED ORDER — ACETAMINOPHEN 500 MG PO TABS: 1000.0000 mg | ORAL_TABLET | Freq: Three times a day (TID) | ORAL | 0 refills | Status: AC | PRN

## 2017-07-16 MED ORDER — METFORMIN HCL 500 MG PO TABS: 500.0000 mg | ORAL_TABLET | Freq: Two times a day (BID) | ORAL | 0 refills | Status: AC

## 2017-07-16 MED ORDER — HALOPERIDOL 5 MG PO TABS: 5.0000 mg | ORAL_TABLET | ORAL | 0 refills | Status: AC

## 2017-07-16 MED ORDER — HALOPERIDOL 10 MG PO TABS: 10.0000 mg | ORAL_TABLET | Freq: Every day | ORAL | 0 refills | Status: AC

## 2017-07-16 MED ORDER — TRAZODONE HCL 50 MG PO TABS: 50.0000 mg | ORAL_TABLET | Freq: Every evening | ORAL | 0 refills | Status: AC | PRN

## 2017-07-16 MED ORDER — CALCIUM CARBONATE-VITAMIN D 500-200 MG-UNIT PO TABS: 1.0000 | ORAL_TABLET | Freq: Every day | ORAL | 0 refills | Status: AC

## 2017-07-16 NOTE — Plan of Care (Signed)
Pt attended and participated in team building, communication, coping skills and goal planning recreation therapy sessions.  Caroll RancherMarjette Emmett Bracknell, [name]

## 2017-07-16 NOTE — Progress Notes (Addendum)
Recreation Therapy Notes  Date: 3.20.19 Time: 10:00 am Location: [address] Dayroom   Group Topic: Self Esteem   Goal Area(s) Addresses:  To increase self esteem: Patients will be able to identify five strengths that will improve self-esteem by the end of Recreation Therapy session.  To increase self-awareness: Patients will be able to identify five things that they like about themselves to increase self-awareness by the of Recreation Therapy session.   Intervention: Craft    Activity: Brochure About Me: Patients created a brochure about themselves listing: best features, proudest moments, strengths, favorite activities, personal note, and things they like about themselves to increase self-esteem   Education: Self-Esteem, Discharge Planning   Education Outcome: Acknowledges education  Clinical Observations/Feedback: Patient did not attend   Sheryle HailDarian Aziya Arena, Recreation Therapy Intern   Sheryle HailDarian Dequann Vandervelden 07/16/2017 11:25 AM

## 2017-07-16 NOTE — Progress Notes (Signed)
  [facility] Discharge Plan :  Will you be returning to the same living situation after discharge:  Yes,  [facility] At discharge, do you have transportation home?: Yes,  brother Do you have the ability to pay for your medications: Yes,  insurance  Release of information consent forms completed and in the chart;  Patient's signature needed at discharge.  Patient to Follow up at: Follow-up Information    [facility].   Why:   Thursday, 08/14/17, at 2:30pm with Dr Barkley Brunselahanty for your hospital follow up appointment.Then Thursday, April 25 at 11:00 with Dr Ferdinand Langoestar.  Your next Haldol injection is due 4/13. Ask the [facility] bvout getting that on 4/18. Contact information: [address] [facility] [address] [place] [id number] [phone]           Next level of care provider has access to [facility] Link:no  Safety Planning and Suicide Prevention discussed: Yes,  yes  Have you used any form of tobacco in the last 30 days? (Cigarettes, Smokeless Tobacco, Cigars, and/or Pipes): No  Has patient been referred to the Quitline?: N/A patient is not a smoker  Patient has been referred for addiction treatment: N/A  Sherri RogueRodney B Ancel Easler, LCSW 07/16/2017, 9:39 AM

## 2017-07-16 NOTE — Progress Notes (Signed)
D: Pt A & O X4. Denies SI, HI, AVH and pain. Presents aminated but anxious and preoccupied related to D/C. Per pt "I'm going home today, I'm just waiting". Rates his depression 0/10, hopelessness 0/10 and anxiety 0/10 ("I just want to leave) on self inventory sheet. Presents with congruent affect and is logical on interactions. Reports she's eating and drinking well with normal energy and good concentration level. Pt d/c home as order; picked up in the lobby by her brother (guardian). A: All medications given to pt per order with verbal education and effects monitored. D/C instructions reviewed with pt and guardian (brother) including follow up appointment, medication samples and prescriptions. Compliance encouraged. All belongings from locker  returned to pt at time of departure. Safety checks maintained without issues thus far. R: Pt receptive to care. Medication compliant without adverse drug reactions at this time. Pt verbalized understanding related to d/c instructions. Signed belonging sheet in agreement with items received. Ambulatory with a steady gait. Appears to be in no physical distress at time of d/c.

## 2017-07-16 NOTE — Progress Notes (Signed)
D: Patient observed resting in bed, isolative to room this evening. Patient responds to questions with one word answers such as "fine" and "okay."  Patient's affect flat, anxious with suspicious, though improving, mood. Patient polite and med compliant. Denies pain, physical complaints.   A: Medicated per orders, no prns requested or required. Level III obs in place for safety. Emotional support offered.  Fall prevention plan in place and reviewed with patient as pt is a moderate fall risk due to assessment tool.   R: Patient verbalizes understanding of POC, falls prevention education.  Patient denies SI/HI/AVH and remains safe on level III obs. Will continue to monitor closely.

## 2017-07-16 NOTE — Progress Notes (Signed)
Recreation Therapy Notes  INPATIENT RECREATION TR PLAN  Patient Details Name: Sherri [name] MRN: [id number] DOB: Jan 25, 1956 Today's Date: 07/16/2017  Rec Therapy Plan Is patient appropriate for Therapeutic Recreation?: Yes Treatment times per week: about 3 days Estimated Length of Stay: 5-7 days TR Treatment/Interventions: Group participation (Comment)  Discharge Criteria Pt will be discharged from therapy if:: Discharged Treatment plan/goals/alternatives discussed and agreed upon by:: Patient/family  Discharge Summary Short term goals set: Pt will attend and participate in recreation therapy sessions with minimal prompting. Short term goals met: Complete Progress toward goals comments: Groups attended Which groups?: Coping skills, Communication, Goal setting, Other (Comment)(Team Building) Reason goals not met: None Therapeutic equipment acquired: N/A Reason patient discharged from therapy: Discharge from hospital Pt/family agrees with progress & goals achieved: Yes Date patient discharged from therapy: 07/16/17    Victorino Sparrow, [name]  Ria Comment, Melody Savidge A 07/16/2017, 12:23 PM

## 2017-07-16 NOTE — BHH Suicide Risk Assessment (Signed)
[facility] Discharge Suicide Risk Assessment   Principal Problem: Schizophrenia, disorganized, chronic (HCC) Discharge Diagnoses:  Patient Active Problem List   Diagnosis Date Noted  . 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 Schizoaffective disorder, bipolar type (HCC) [F25.0] 05/31/2017  . Psychosis in elderly, with behavioral disturbance [F03.91] 04/12/2017    Total Time spent with patient: 30 minutes  Musculoskeletal: Strength & Muscle Tone: within normal limits Gait & Station: normal Patient leans: N/A  Psychiatric Specialty Exam: Review of Systems  Constitutional: Negative for chills and fever.  Respiratory: Negative for cough and shortness of breath.   Cardiovascular: Negative for chest pain.  Gastrointestinal: Negative for abdominal pain, heartburn, nausea and vomiting.  Psychiatric/Behavioral: Negative for depression and suicidal ideas.    Blood pressure (!) 173/80, pulse (!) 106, temperature 97.8 F (36.6 C), temperature source Oral, resp. rate 16, height 4' 11.45" (1.51 m), weight 62.1 kg (137 lb), SpO2 97 %.Body mass index is 27.25 kg/m.  General Appearance: Casual and Fairly Groomed  Patent attorney::  Good  Speech:  Clear and Coherent and Normal Rate  Volume:  Normal  Mood:  Euphoric  Affect:  Appropriate, Congruent and Constricted  Thought Process:  Coherent and Goal Directed  Orientation:  Full (Time, Place, and Person)  Thought Content:  Delusions, Ideas of Reference:   Paranoia Delusions and Paranoid Ideation  Suicidal Thoughts:  No  Homicidal Thoughts:  No  Memory:  Immediate;   Fair Recent;   Fair Remote;   Fair  Judgement:  Fair  Insight:  Lacking  Psychomotor Activity:  Normal  Concentration:  Fair  Recall:  [employer] of Knowledge:Fair  Language: Fair  Akathisia:  No  Handed:    AIMS (if indicated):     Assets:  Communication Skills Resilience  Sleep:  Number of Hours: 6.75  Cognition: WNL  ADL's:  Intact   Mental Status  Per Nursing Assessment::   On Admission:  NA  Demographic Factors:  Caucasian and Unemployed  Loss Factors: Financial problems/change in socioeconomic status  Historical Factors: Impulsivity  Risk Reduction Factors:   Living with another person, especially a relative, Positive social support, Positive therapeutic relationship and Positive coping skills or problem solving skills  Continued Clinical Symptoms:  Schizophrenia:   Paranoid or undifferentiated type  Cognitive Features That Contribute To Risk:  Closed-mindedness, Polarized thinking and Thought constriction (tunnel vision)    Suicide Risk:  Minimal: No identifiable suicidal ideation.  Patients presenting with no risk factors but with morbid ruminations; may be classified as minimal risk based on the severity of the depressive symptoms  Follow-up Information    [facility] Go on 08/14/2017.   Why:   Thursday, 08/14/17, at 2:30pm with Dr Barkley Bruns for your hospital follow up appointment.Then Thursday, April 25 at 11:00 with Dr Ferdinand Lango.  Your next Haldol injection is due 4/13. [facility]. Contact information: [address] [place] [address] [phone]         Subjective Data: Sherri [name] is a 62 y/o F with history of schizophrenia who was admitted with worening psychosis and paranoia. Pt has a guardian whom is her brother. She has received a second opinion for forced medicationswhich has been renewed multiple times during her admission - most recently on 06/25/17, but it was allowed to expire as pt has been recently adherent to prescribed oral haldol.She remains paranoid, guarded, and mostlyuncooperative with the treatment team. SW team has been [facility], but thatremains to bean unavailable option at this time. Pt was changed from zyprexa to haldol due to lack of efficacy regarding her  presenting symptoms and availability of haldol as a long-acting injectable form, and on 3/13/19she was administered first dose of haldol decanoate. She has been demonstrating improved agitation and ability to interact with the treatment team.   Today upon evaluation, pt shares, "I'm okay." She has no specific concerns other than confirming that her brother is planning on coming to pick her up later in the afternoon. She denies SI/HI/AH/VH. She has no physical complaints. She is tolerating her current medications without difficulty or side effect. Discussed with patient that recommendation is she continues to take her current regimen without changes including monthly injections of haldol decanoate, and pt verbalized agreement and good understanding. Also reviewed with patient that her brother as her guardian may recommend that she be evaluated in the ED if there are concerns about her behavior at her assisted living facility, and pt verbalized good understanding. Pt is in [facility]. She was able to engage in safety planning including [facility] or contact emergency services if she feels unable to maintain her own safety or the safety of others. Pt had no further questions, comments, or concerns.   Plan Of Care/Follow-up recommendations:   -Discharge to outpatient level of care  -Schizophrenia - Continuehaldol 5mg  po qAM + haldol 10mg  po qhs -ContinueHaldol Decanoate 100mg  IM q30 Days (given on 07/09/17)  -Anxiety -Continueatarax 25mg  po q8h prn anxiety  - HTN -Continuelosartan 100mg  po qDay  - DMII -Continuemetformin 500mg  po BID  - Insomnia -Continuetrazodone 50mg  po qhs prn insomnia  Activity:  as tolerated Diet:  normal Tests:  NA Other:  see above for DC plan  Micheal Likenshristopher T Tanee Henery, MD 07/16/2017, 10:43 AM

## 2017-07-16 NOTE — Discharge Summary (Addendum)
Physician Discharge Summary Note  Patient:  Sherri [name] is an 62 y.o., female  MRN:  [id number]  DOB:  02/16/1956  Patient phone:  [phone] (home)   Patient address:   [id number] [place] [address],   Total Time spent with patient: Greater than 30 minutes  Date of Admission:  06/03/2017 Date of Discharge: 07-16-17  Reason for Admission: Worsening psychosis & paranoia.  Principal Problem: Schizophrenia, disorganized, chronic [facility])  Discharge Diagnoses: Patient Active Problem List   Diagnosis Date Noted  . Schizophrenia, disorganized, chronic ([place]) [F20.1] 06/03/2017  . Schizoaffective disorder, bipolar type ([place]) [F25.0] 05/31/2017  . Psychosis in elderly, with behavioral disturbance [F03.91] 04/12/2017   Past Psychiatric History: Schizophrenia, disorganized  Past Medical History:  Past Medical History:  Diagnosis Date  . Bipolar 1 disorder ([place])   . Hypertension   . Prediabetes   . Schizophrenia ([place])   . Tachycardia     Past Surgical History:  Procedure Laterality Date  . COLON SURGERY     Family History: History reviewed. No pertinent family history. Family Psychiatric  History: See H&P Social History:  Social History   Substance and Sexual Activity  Alcohol Use No  . Frequency: Never     Social History   Substance and Sexual Activity  Drug Use No    Social History   Socioeconomic History  . Marital status: Divorced    Spouse name: None  . Number of children: None  . Years of education: None  . Highest education level: None  Social Needs  . Financial resource strain: None  . Food insecurity - worry: None  . Food insecurity - inability: None  . Transportation needs - medical: None  . Transportation needs - non-medical: None  Occupational History  . None  Tobacco Use  . Smoking status: Never Smoker  . Smokeless tobacco: Never Used  Substance and Sexual Activity  . Alcohol use: No    Frequency: Never  . Drug use: No   . Sexual activity: None  Other Topics Concern  . None  Social History Narrative  . None   Hospital Course: (Per Md's SRA): Sherri [name] is a 62 y/o F with history of schizophrenia who was admitted with worening psychosis and paranoia. Pt has a guardian whom is her brother. She has received a second opinion for forced medicationswhich has been renewed multiple times during her admission - most recently on 06/25/17, but it was allowed to expire as pt has been recently adherent to prescribed oral haldol.She remains paranoid, guarded, and mostlyuncooperative with the treatment team. SW team has been exploring option of transfer to [facility] inpatient psychiatry unit, but thatremains to bean unavailable option at this time. Pt was changed from zyprexa to haldol due to lack of efficacy regarding her presenting symptoms and availability of haldol as a long-acting injectable form, and on 3/13/19she was administered first dose of haldol decanoate. She has been demonstrating improved agitation and ability to interact with the treatment team.  Besides the monthly Haldol injectable which will due to be administered on 08-08-17, [name] was medicated & discharged on; Haldol tablets 5 mg & 10 mg respectively for mood control, Vistaril 25 mg prn for anxiety & Trazodone 50 mg for insomnia. She was resumed on all her pertinent home medications for her other pre-existing medical issues presented. She tolerated her treatment regimen without any adverse effects or reactions reported. [name] was enrolled & seldom participated in the group counseling sessions. She was most  of the time spent her times alone in her room as she stated she was afraid of men due to previous bad experiences. She was however monitored closely by the staff with her needs met.   Today upon evaluation, pt shares, "I'm okay." She has no specific concerns other than confirming that her brother is planning on coming to pick her up later  in the afternoon. She denies SI/HI/AH/VH. She has no physical complaints. She is tolerating her current medications without difficulty or side effect. Discussed with patient that recommendation is she continues to take her current regimen without changes including monthly injections of haldol decanoate, and pt verbalized agreement and good understanding. Also reviewed with patient that her brother as her guardian may recommend that she be evaluated in the ED if there are concerns about her behavior at her assisted living facility, and pt verbalized good understanding. Pt is in agreement to continue having follow up at the [place] after discharge. She was able to engage in safety planning including plan to return to [facility] or contact emergency services if she feels unable to maintain her own safety or the safety of others. Pt had no further questions, comments, or concerns.  Upon discharge, [name] presents more stable than upon her admission. She will continue further mental health care as noted below. She was provided with all the necessary information needed to make this appointment without problems. She [facility] with all personal belongings in no aparent distress. She received a 7 days worth, supply samples of her [facility] discharge medications from the [place]. Transportation per brother.  Physical Findings: AIMS: Facial and Oral Movements Muscles of Facial Expression: None, normal Lips and Perioral Area: None, normal Jaw: None, normal Tongue: None, normal,Extremity Movements Upper (arms, wrists, hands, fingers): None, normal Lower (legs, knees, ankles, toes): None, normal, Trunk Movements Neck, shoulders, hips: None, normal, Overall Severity Severity of abnormal movements (highest score from questions above): None, normal Incapacitation due to abnormal movements: None, normal Patient's awareness of abnormal movements (rate only patient's report): No Awareness, Dental Status Current problems with  teeth and/or dentures?: No Does patient usually wear dentures?: No  CIWA:  CIWA-Ar Total: 1 COWS:     Musculoskeletal: Strength & Muscle Tone: within normal limits Gait & Station: normal Patient leans: N/A  Psychiatric Specialty Exam: Physical Exam  Constitutional: She appears well-developed.  HENT:  Head: Normocephalic.  Eyes: Pupils are equal, round, and reactive to light.  Neck: Normal range of motion.  Cardiovascular: Normal rate.  Respiratory: Effort normal.  GI: Soft.  Genitourinary:  Genitourinary Comments: Deferred  Musculoskeletal: Normal range of motion.  Neurological: She is alert.  Skin: Skin is warm.    Review of Systems  Constitutional: Negative.   HENT: Negative.   Eyes: Negative.   Respiratory: Negative.   Cardiovascular: Negative.   Gastrointestinal: Negative.   Genitourinary: Negative.   Musculoskeletal: Negative.   Skin: Negative.   Neurological: Negative.   Endo/Heme/Allergies: Negative.   Psychiatric/Behavioral: Positive for depression (Stable) and hallucinations (Hx. paranoia, delusions). Negative for memory loss, substance abuse and suicidal ideas. The patient has insomnia (Stable). The patient is not nervous/anxious.     Blood pressure (!) 173/80, pulse (!) 106, temperature 97.8 F (36.6 C), temperature source Oral, resp. rate 16, height 4' 11.45" (1.51 m), weight 62.1 kg (137 lb), SpO2 97 %.Body mass index is 27.25 kg/m.  See Md's SRA   Have you used any form of tobacco in the last 30 days? (Cigarettes, Smokeless Tobacco, Cigars,  and/or Pipes): No  Has this patient used any form of tobacco in the last 30 days? (Cigarettes, Smokeless Tobacco, Cigars, and/or Pipes): N/A  Blood Alcohol level:  Lab Results  Component Value Date   ETH <10 05/30/2017   ETH <10 76/19/5093   Metabolic Disorder Labs:  No results found for: HGBA1C, MPG No results found for: PROLACTIN No results found for: CHOL, TRIG, HDL, CHOLHDL, VLDL, LDLCALC  See  Psychiatric Specialty Exam and Suicide Risk Assessment completed by Attending Physician prior to discharge.  Discharge destination:  Home  Is patient on multiple antipsychotic therapies at discharge:  No   Has Patient had three or more failed trials of antipsychotic monotherapy by history:  No  Recommended Plan for Multiple Antipsychotic Therapies: NA  Allergies as of 07/16/2017   No Known Allergies     Medication List    STOP taking these medications   chlorhexidine 0.12 % solution Commonly known as:  PERIDEX   cholecalciferol 1000 units tablet Commonly known as:  VITAMIN D   Cinnamon 500 MG capsule   CRANBERRY CONCENTRATE 500 MG Caps Generic drug:  Cranberry   ferrous sulfate 325 (65 FE) MG tablet   guaifenesin 400 MG Tabs tablet Commonly known as:  HUMIBID E   guaiFENesin-dextromethorphan 100-10 MG/5ML syrup Commonly known as:  ROBITUSSIN DM   loratadine 10 MG tablet Commonly known as:  CLARITIN   multivitamin with minerals Tabs tablet   risperiDONE 0.5 MG tablet Commonly known as:  RISPERDAL     TAKE these medications     Indication  acetaminophen 500 MG tablet Commonly known as:  TYLENOL Take 2 tablets (1,000 mg total) by mouth every 8 (eight) hours as needed for mild pain.  Indication:  Fever, Pain   calcium-vitamin D 500-200 MG-UNIT tablet Commonly known as:  OSCAL WITH D Take 1 tablet by mouth daily with breakfast. For bone health Start taking on:  07/17/2017 What changed:  additional instructions  Indication:  Low Amount of Calcium in the Blood   haloperidol 10 MG tablet Commonly known as:  HALDOL Take 1 tablet (10 mg total) by mouth at bedtime. For mood control  Indication:  Mood control   haloperidol 5 MG tablet Commonly known as:  HALDOL Take 1 tablet (5 mg total) by mouth every morning. For mood control Start taking on:  07/17/2017  Indication:  Mood control   haloperidol decanoate 100 MG/ML injection Commonly known as:  HALDOL  DECANOATE Inject 1 mL (100 mg total) into the muscle every 30 (thirty) days. (Due on 08-08-17): For mood control Start taking on:  08/08/2017  Indication:  Mood control   hydrOXYzine 25 MG tablet Commonly known as:  ATARAX/VISTARIL Take 1 tablet (25 mg total) by mouth every 8 (eight) hours. For anxiety What changed:    when to take this  reasons to take this  additional instructions  Indication:  Feeling Anxious   losartan 100 MG tablet Commonly known as:  COZAAR Take 1 tablet (100 mg total) by mouth daily. For high blood pressure Start taking on:  07/17/2017 What changed:  additional instructions  Indication:  High Blood Pressure Disorder   metFORMIN 500 MG tablet Commonly known as:  GLUCOPHAGE Take 1 tablet (500 mg total) by mouth 2 (two) times daily with a meal. For diabetes management What changed:  additional instructions  Indication:  Type 2 Diabetes   traZODone 50 MG tablet Commonly known as:  DESYREL Take 1 tablet (50 mg total) by mouth at bedtime  as needed for sleep.  Indication:  [facility] [place]. Go on 08/14/2017.   Why:   Thursday, 08/14/17, at 2:30pm with Dr Vallery Ridge for your hospital follow up appointment.Then Thursday, April 25 at 11:00 with Dr Ina Kick.  Your next Haldol injection is due 4/13. Ask the [facility] bvout getting that on 4/18. Contact information: [place] [address] [phone]          Follow-up recommendations: Activity:  As tolerated Diet: As recommended by your primary care doctor. Keep all scheduled follow-up appointments as recommended.   Comments: Patient is instructed prior to discharge to: Take all medications as prescribed by his/her mental healthcare provider. Report any adverse effects and or reactions from the medicines to his/her outpatient provider promptly. Patient has been instructed & cautioned: To not engage in alcohol and or illegal  drug use while on prescription medicines. In the event of worsening symptoms, patient is instructed to call the crisis hotline, 911 and or go to the nearest ED for appropriate evaluation and treatment of symptoms. To follow-up with his/her primary care provider for your other medical issues, concerns and or health care needs.   Signed: Lindell Spar, NP, PMHNP, FNP-BC 07/16/2017, 10:02 AM   Patient seen, Suicide Assessment Completed.  Disposition Plan Reviewed

## 2017-07-25 ENCOUNTER — Inpatient Hospital Stay (HOSPITAL_COMMUNITY): Payer: Non-veteran care

## 2017-07-25 ENCOUNTER — Emergency Department (HOSPITAL_COMMUNITY)
Admission: EM | Admit: 2017-07-25 | Discharge: 2017-07-25 | Disposition: A | Discharge status: Home / Self Care | Payer: Non-veteran care | Attending: Emergency Medicine | Admitting: Emergency Medicine

## 2017-07-25 ENCOUNTER — Other Ambulatory Visit: Payer: Self-pay

## 2017-07-25 ENCOUNTER — Inpatient Hospital Stay (HOSPITAL_COMMUNITY)
Admission: EM | Admit: 2017-07-25 | Discharge: 2017-08-13 | DRG: 690 | Disposition: A | Discharge status: Other Acute Inpatient Hospital | Payer: Non-veteran care | Attending: Family Medicine | Admitting: Family Medicine

## 2017-07-25 ENCOUNTER — Encounter (HOSPITAL_COMMUNITY): Payer: Self-pay

## 2017-07-25 ENCOUNTER — Emergency Department (HOSPITAL_COMMUNITY): Payer: Non-veteran care

## 2017-07-25 DIAGNOSIS — I6523 Occlusion and stenosis of bilateral carotid arteries: Secondary | ICD-10-CM | POA: Diagnosis present

## 2017-07-25 DIAGNOSIS — R531 Weakness: Secondary | ICD-10-CM

## 2017-07-25 DIAGNOSIS — N179 Acute kidney failure, unspecified: Secondary | ICD-10-CM | POA: Diagnosis present

## 2017-07-25 DIAGNOSIS — D72829 Elevated white blood cell count, unspecified: Secondary | ICD-10-CM | POA: Diagnosis not present

## 2017-07-25 DIAGNOSIS — F202 Catatonic schizophrenia: Secondary | ICD-10-CM | POA: Diagnosis not present

## 2017-07-25 DIAGNOSIS — G459 Transient cerebral ischemic attack, unspecified: Secondary | ICD-10-CM | POA: Insufficient documentation

## 2017-07-25 DIAGNOSIS — I639 Cerebral infarction, unspecified: Secondary | ICD-10-CM | POA: Diagnosis present

## 2017-07-25 DIAGNOSIS — F319 Bipolar disorder, unspecified: Secondary | ICD-10-CM | POA: Diagnosis present

## 2017-07-25 DIAGNOSIS — G8191 Hemiplegia, unspecified affecting right dominant side: Secondary | ICD-10-CM | POA: Diagnosis not present

## 2017-07-25 DIAGNOSIS — N390 Urinary tract infection, site not specified: Principal | ICD-10-CM | POA: Diagnosis present

## 2017-07-25 DIAGNOSIS — I10 Essential (primary) hypertension: Secondary | ICD-10-CM | POA: Diagnosis present

## 2017-07-25 DIAGNOSIS — F061 Catatonic disorder due to known physiological condition: Secondary | ICD-10-CM | POA: Diagnosis not present

## 2017-07-25 DIAGNOSIS — R45851 Suicidal ideations: Secondary | ICD-10-CM | POA: Diagnosis not present

## 2017-07-25 DIAGNOSIS — Z79899 Other long term (current) drug therapy: Secondary | ICD-10-CM

## 2017-07-25 DIAGNOSIS — I5030 Unspecified diastolic (congestive) heart failure: Secondary | ICD-10-CM | POA: Diagnosis not present

## 2017-07-25 DIAGNOSIS — Z7984 Long term (current) use of oral hypoglycemic drugs: Secondary | ICD-10-CM | POA: Diagnosis not present

## 2017-07-25 DIAGNOSIS — F419 Anxiety disorder, unspecified: Secondary | ICD-10-CM | POA: Diagnosis not present

## 2017-07-25 DIAGNOSIS — G4700 Insomnia, unspecified: Secondary | ICD-10-CM | POA: Diagnosis not present

## 2017-07-25 DIAGNOSIS — F4321 Adjustment disorder with depressed mood: Secondary | ICD-10-CM | POA: Diagnosis present

## 2017-07-25 DIAGNOSIS — E119 Type 2 diabetes mellitus without complications: Secondary | ICD-10-CM | POA: Diagnosis not present

## 2017-07-25 DIAGNOSIS — R945 Abnormal results of liver function studies: Secondary | ICD-10-CM | POA: Diagnosis present

## 2017-07-25 DIAGNOSIS — R296 Repeated falls: Secondary | ICD-10-CM | POA: Diagnosis not present

## 2017-07-25 DIAGNOSIS — K760 Fatty (change of) liver, not elsewhere classified: Secondary | ICD-10-CM | POA: Diagnosis not present

## 2017-07-25 DIAGNOSIS — E860 Dehydration: Secondary | ICD-10-CM | POA: Diagnosis not present

## 2017-07-25 DIAGNOSIS — I959 Hypotension, unspecified: Secondary | ICD-10-CM | POA: Diagnosis not present

## 2017-07-25 LAB — DIFFERENTIAL
Basophils Absolute: 0 10*3/uL (ref 0.0–0.1)
Basophils Relative: 0 %
EOS PCT: 0 %
Eosinophils Absolute: 0 10*3/uL (ref 0.0–0.7)
LYMPHS PCT: 11 %
Lymphs Abs: 1.5 10*3/uL (ref 0.7–4.0)
Monocytes Absolute: 1 10*3/uL (ref 0.1–1.0)
Monocytes Relative: 7 %
NEUTROS ABS: 11.2 10*3/uL — AB (ref 1.7–7.7)
NEUTROS PCT: 82 %

## 2017-07-25 LAB — CBC
HCT: 45.1 % (ref 36.0–46.0)
Hemoglobin: 15.1 g/dL — ABNORMAL HIGH (ref 12.0–15.0)
MCH: 29.8 pg (ref 26.0–34.0)
MCHC: 33.5 g/dL (ref 30.0–36.0)
MCV: 89 fL (ref 78.0–100.0)
PLATELETS: 286 10*3/uL (ref 150–400)
RBC: 5.07 MIL/uL (ref 3.87–5.11)
RDW: 12.8 % (ref 11.5–15.5)
WBC: 13.8 10*3/uL — AB (ref 4.0–10.5)

## 2017-07-25 LAB — URINALYSIS, ROUTINE W REFLEX MICROSCOPIC
BILIRUBIN URINE: NEGATIVE
Glucose, UA: NEGATIVE mg/dL
Ketones, ur: 20 mg/dL — AB
LEUKOCYTES UA: NEGATIVE
NITRITE: NEGATIVE
PH: 5 (ref 5.0–8.0)
Protein, ur: 30 mg/dL — AB
SPECIFIC GRAVITY, URINE: 1.017 (ref 1.005–1.030)

## 2017-07-25 LAB — I-STAT CHEM 8, ED
BUN: 43 mg/dL — ABNORMAL HIGH (ref 6–20)
CHLORIDE: 104 mmol/L (ref 101–111)
Calcium, Ion: 1.14 mmol/L — ABNORMAL LOW (ref 1.15–1.40)
Creatinine, Ser: 0.8 mg/dL (ref 0.44–1.00)
Glucose, Bld: 144 mg/dL — ABNORMAL HIGH (ref 65–99)
HEMATOCRIT: 46 % (ref 36.0–46.0)
Hemoglobin: 15.6 g/dL — ABNORMAL HIGH (ref 12.0–15.0)
POTASSIUM: 5.1 mmol/L (ref 3.5–5.1)
SODIUM: 137 mmol/L (ref 135–145)
TCO2: 26 mmol/L (ref 22–32)

## 2017-07-25 LAB — CBG MONITORING, ED
GLUCOSE-CAPILLARY: 119 mg/dL — AB (ref 65–99)
Glucose-Capillary: 128 mg/dL — ABNORMAL HIGH (ref 65–99)

## 2017-07-25 LAB — I-STAT TROPONIN, ED: Troponin i, poc: 0.01 ng/mL (ref 0.00–0.08)

## 2017-07-25 LAB — COMPREHENSIVE METABOLIC PANEL
ALBUMIN: 4.6 g/dL (ref 3.5–5.0)
ALK PHOS: 77 U/L (ref 38–126)
ALT: 65 U/L — ABNORMAL HIGH (ref 14–54)
ANION GAP: 15 (ref 5–15)
AST: 92 U/L — ABNORMAL HIGH (ref 15–41)
BUN: 28 mg/dL — ABNORMAL HIGH (ref 6–20)
CALCIUM: 10.5 mg/dL — AB (ref 8.9–10.3)
CO2: 20 mmol/L — AB (ref 22–32)
Chloride: 103 mmol/L (ref 101–111)
Creatinine, Ser: 0.94 mg/dL (ref 0.44–1.00)
GFR calc Af Amer: >60 mL/min (ref >60)
GFR calc non Af Amer: >60 mL/min (ref >60)
GLUCOSE: 141 mg/dL — AB (ref 65–99)
POTASSIUM: 3.6 mmol/L (ref 3.5–5.1)
SODIUM: 138 mmol/L (ref 135–145)
Total Bilirubin: 1.2 mg/dL (ref 0.3–1.2)
Total Protein: 7.7 g/dL (ref 6.5–8.1)

## 2017-07-25 LAB — APTT: APTT: 28 s (ref 24–36)

## 2017-07-25 LAB — PROTIME-INR
INR: 0.97
PROTHROMBIN TIME: 12.8 s (ref 11.4–15.2)

## 2017-07-25 MED ORDER — SODIUM CHLORIDE 0.9 % IV SOLN
1.0000 g | INTRAVENOUS | Status: DC
  Administered 2017-07-25 – 2017-07-27 (×3): 1 g via INTRAVENOUS
  Filled 2017-07-25 (×4): qty 10

## 2017-07-25 MED ORDER — HALOPERIDOL 5 MG PO TABS
5.0000 mg | ORAL_TABLET | ORAL | Status: DC
Start: 2017-07-26 — End: 2017-08-13
  Administered 2017-07-26 – 2017-08-13 (×13): 5 mg via ORAL
  Filled 2017-07-25: qty 1
  Filled 2017-07-25 (×3): qty 5
  Filled 2017-07-25 (×6): qty 1
  Filled 2017-07-25 (×3): qty 5
  Filled 2017-07-25 (×2): qty 1
  Filled 2017-07-25: qty 5

## 2017-07-25 MED ORDER — ACETAMINOPHEN 325 MG PO TABS
650.0000 mg | ORAL_TABLET | ORAL | Status: DC | PRN
  Administered 2017-08-06 – 2017-08-13 (×17): 650 mg via ORAL
  Filled 2017-07-25 (×18): qty 2

## 2017-07-25 MED ORDER — LOSARTAN POTASSIUM 50 MG PO TABS
100.0000 mg | ORAL_TABLET | Freq: Every day | ORAL | Status: DC
  Administered 2017-07-27 – 2017-08-07 (×11): 100 mg via ORAL
  Filled 2017-07-25 (×14): qty 2

## 2017-07-25 MED ORDER — ASPIRIN 325 MG PO TABS
325.0000 mg | ORAL_TABLET | Freq: Every day | ORAL | Status: DC
  Administered 2017-07-26 – 2017-08-06 (×11): 325 mg via ORAL
  Filled 2017-07-25 (×12): qty 1

## 2017-07-25 MED ORDER — HALOPERIDOL 5 MG PO TABS
10.0000 mg | ORAL_TABLET | Freq: Every day | ORAL | Status: DC
  Administered 2017-07-25: 10 mg via ORAL
  Filled 2017-07-25: qty 10
  Filled 2017-07-25: qty 2

## 2017-07-25 MED ORDER — METFORMIN HCL 500 MG PO TABS
500.0000 mg | ORAL_TABLET | Freq: Two times a day (BID) | ORAL | Status: DC
  Administered 2017-07-26 – 2017-08-08 (×23): 500 mg via ORAL
  Filled 2017-07-25 (×24): qty 1

## 2017-07-25 MED ORDER — STROKE: EARLY STAGES OF RECOVERY BOOK
Freq: Once | Status: DC
  Filled 2017-07-25: qty 1

## 2017-07-25 MED ORDER — ACETAMINOPHEN 160 MG/5ML PO SOLN: 650.0000 mg | ORAL | Status: DC | PRN

## 2017-07-25 MED ORDER — TRAZODONE HCL 50 MG PO TABS: 50.0000 mg | ORAL_TABLET | Freq: Every evening | ORAL | Status: DC | PRN

## 2017-07-25 MED ORDER — ASPIRIN 300 MG RE SUPP: 300.0000 mg | Freq: Every day | RECTAL | Status: DC

## 2017-07-25 MED ORDER — HYDROXYZINE HCL 25 MG PO TABS
25.0000 mg | ORAL_TABLET | Freq: Three times a day (TID) | ORAL | Status: DC
  Administered 2017-07-25 – 2017-08-13 (×54): 25 mg via ORAL
  Filled 2017-07-25 (×55): qty 1

## 2017-07-25 MED ORDER — ACETAMINOPHEN 650 MG RE SUPP: 650.0000 mg | RECTAL | Status: DC | PRN

## 2017-07-25 NOTE — Code Documentation (Addendum)
62 yo female coming from nursing facility with complaints of aphasia and right arm weakness that started this morning. Pt was reported to last seen normal at 0730 by staff. Hx of schizophrenia and Diabetes. Code Stroke called in field. Stroke Team arrived to ED. When arrived to ED, pt was alert and speaking clearly. Pt taken to CT. CT negative. NIHSS 1. Pt was able to follow commands, no weakness noted. When asked, pt reported the month was April. Code Stroke cancelled. Not a Stroke. Handoff given to [employer], Charity fundraiserN.

## 2017-07-25 NOTE — ED Notes (Signed)
[facility] - informed staff that patient would benefit from a walker and would need to f/u with her PCP.

## 2017-07-25 NOTE — ED Triage Notes (Signed)
Pt arrives to ED from home since being inappropriately discharged from the hospital earlier today after diagnosed with a TIA. EMS reports pt has hx of schizophrenia, which she will not admit to. Pt is a&o x4. Pt placed in position of comfort with bed locked and lowered, call bell in reach.

## 2017-07-25 NOTE — ED Notes (Signed)
Patient transported to MRI 

## 2017-07-25 NOTE — ED Notes (Signed)
Patient reports difficulty walking for the last week, explains that her legs feel more weak than normal. No drift noted upon neuro assessments, but patient required 2+ assist with moving to the bedside commode.

## 2017-07-25 NOTE — H&P (Signed)
TRH H&P   Patient Demographics:    Sherri [name], is a 62 y.o. female  MRN: [id number]   DOB - May 04, 1955  Admit Date - 07/25/2017  Outpatient Primary MD for the patient is [facility]  Referring MD/NP/PA: Melene Plan  Outpatient Specialists:   Patient coming from: [facility]  Chief Complaint  Patient presents with  . Transient Ischemic Attack      HPI:    Sherri [name]  is a 62 y.o. female, w Bipolar, Schizophrenis, hypertension, glucose intolerance, apparently c/o generalized weakness.  Per ED notes ? C/o right sided weakness. Pt is a poor historian and unable to tell exactly when her symptoms started.  Pt denies headache, vision change, dysarthria, cp, palp, sob, n/v, diarrhea, brbpr.    In ED,  CT brain IMPRESSION: 1. No acute intracranial abnormality 2. ASPECTS is 10 3. These results were called by telephone at the time of interpretation on 07/25/2017 at 11:24 am to Dr. Laurence Slate , who verbally acknowledged these results.  Urinalysis wbc 6-30 INR 0.97 Wbc 13.8, Hgb 15.1, Plt 286 Na 138, K 3.6, Bun 28, Creatinine 0.94, Ast 92, Alt 65 Trop 0.01  Pt will be admitted for generalized weakness and uti.        Review of systems:    In addition to the HPI above,   No Fever-chills, No Headache, No changes with Vision or hearing, No problems swallowing food or Liquids, No Chest pain, Cough or Shortness of Breath, No Abdominal pain, No Nausea or Vommitting, Bowel movements are regular, No Blood in stool or Urine, No dysuria, No new skin rashes or bruises, No new joints pains-aches,  No new weakness, tingling, numbness in any extremity, No recent weight gain or loss, No polyuria, polydypsia or polyphagia, No significant Mental Stressors.  A full 10 point Review of Systems was done, except as stated above, all other Review of Systems were  negative.   With Past History of the following :    Past Medical History:  Diagnosis Date  . Bipolar 1 disorder (HCC)   . Hypertension   . Prediabetes   . Schizophrenia (HCC)   . Tachycardia       Past Surgical History:  Procedure Laterality Date  . COLON SURGERY        Social History:     Social History   Tobacco Use  . Smoking status: Never Smoker  . Smokeless tobacco: Never Used  Substance Use Topics  . Alcohol use: No    Frequency: Never     Lives - at home  Mobility -  Walks by self    Family History :    History reviewed. No pertinent family history.   No hx of stroke    Home Medications:   Prior to Admission medications   Medication Sig Start Date End Date Taking? Authorizing Provider  acetaminophen (TYLENOL) 500  MG tablet Take 2 tablets (1,000 mg total) by mouth every 8 (eight) hours as needed for mild pain. 07/16/17   Armandina Stammer I, NP  calcium-vitamin D (OSCAL WITH D) 500-200 MG-UNIT tablet Take 1 tablet by mouth daily with breakfast. For bone health 07/17/17   Armandina Stammer I, NP  haloperidol (HALDOL) 10 MG tablet Take 1 tablet (10 mg total) by mouth at bedtime. For mood control 07/16/17   Armandina Stammer I, NP  haloperidol (HALDOL) 5 MG tablet Take 1 tablet (5 mg total) by mouth every morning. For mood control 07/17/17   Armandina Stammer I, NP  haloperidol decanoate (HALDOL DECANOATE) 100 MG/ML injection Inject 1 mL (100 mg total) into the muscle every 30 (thirty) days. (Due on 08-08-17): For mood control 08/08/17   Armandina Stammer I, NP  hydrOXYzine (ATARAX/VISTARIL) 25 MG tablet Take 1 tablet (25 mg total) by mouth every 8 (eight) hours. For anxiety 07/16/17   Armandina Stammer I, NP  losartan (COZAAR) 100 MG tablet Take 1 tablet (100 mg total) by mouth daily. For high blood pressure 07/17/17   Armandina Stammer I, NP  metFORMIN (GLUCOPHAGE) 500 MG tablet Take 1 tablet (500 mg total) by mouth 2 (two) times daily with a meal. For diabetes management 07/16/17   Armandina Stammer  I, NP  traZODone (DESYREL) 50 MG tablet Take 1 tablet (50 mg total) by mouth at bedtime as needed for sleep. 07/16/17   Armandina Stammer I, NP     Allergies:    No Known Allergies   Physical Exam:   Vitals  Blood pressure 129/77, pulse (!) 101, temperature 98.2 F (36.8 C), resp. rate 19, height 5\' 2"  (1.575 m), weight 60.3 kg (133 lb), SpO2 97 %.   1. General lying in bed in NAD,    2. Normal affect and insight, Not Suicidal or Homicidal, Awake Alert, Oriented X 3.  3. No F.N deficits, ALL C.Nerves Intact, Strength 5/5 all 4 extremities, Sensation intact all 4 extremities, Plantars down going.  4. Ears and Eyes appear Normal, Conjunctivae clear, PERRLA. Moist Oral Mucosa.  5. Supple Neck, No JVD, No cervical lymphadenopathy appriciated, No Carotid Bruits.  6. Symmetrical Chest wall movement, Good air movement bilaterally, CTAB.  7. RRR, No Gallops, Rubs or Murmurs, No Parasternal Heave.  8. Positive Bowel Sounds, Abdomen Soft, No tenderness, No organomegaly appriciated,No rebound -guarding or rigidity.  9.  No Cyanosis, Normal Skin Turgor, No Skin Rash or Bruise.  10. Good muscle tone,  joints appear normal , no effusions, Normal ROM.  11. No Palpable Lymph Nodes in Neck or Axillae     Data Review:    CBC Recent Labs  Lab 07/25/17 1059 07/25/17 1107  WBC 13.8*  --   HGB 15.1* 15.6*  HCT 45.1 46.0  PLT 286  --   MCV 89.0  --   MCH 29.8  --   MCHC 33.5  --   RDW 12.8  --   LYMPHSABS 1.5  --   MONOABS 1.0  --   EOSABS 0.0  --   BASOSABS 0.0  --    ------------------------------------------------------------------------------------------------------------------  Chemistries  Recent Labs  Lab 07/25/17 1059 07/25/17 1107  NA 138 137  K 3.6 5.1  CL 103 104  CO2 20*  --   GLUCOSE 141* 144*  BUN 28* 43*  CREATININE 0.94 0.80  CALCIUM 10.5*  --   AST 92*  --   ALT 65*  --   ALKPHOS 77  --  BILITOT 1.2  --     ------------------------------------------------------------------------------------------------------------------ estimated creatinine clearance is 62.4 mL/min (by C-G formula based on SCr of 0.8 mg/dL). ------------------------------------------------------------------------------------------------------------------ No results for input(s): TSH, T4TOTAL, T3FREE, THYROIDAB in the last 72 hours.  Invalid input(s): FREET3  Coagulation profile Recent Labs  Lab 07/25/17 1059  INR 0.97   ------------------------------------------------------------------------------------------------------------------- No results for input(s): DDIMER in the last 72 hours. -------------------------------------------------------------------------------------------------------------------  Cardiac Enzymes No results for input(s): CKMB, TROPONINI, MYOGLOBIN in the last 168 hours.  Invalid input(s): CK ------------------------------------------------------------------------------------------------------------------ No results found for: BNP   ---------------------------------------------------------------------------------------------------------------  Urinalysis    Component Value Date/Time   COLORURINE YELLOW 07/25/2017 1335   APPEARANCEUR CLOUDY (A) 07/25/2017 1335   LABSPEC 1.017 07/25/2017 1335   PHURINE 5.0 07/25/2017 1335   GLUCOSEU NEGATIVE 07/25/2017 1335   HGBUR SMALL (A) 07/25/2017 1335   BILIRUBINUR NEGATIVE 07/25/2017 1335   KETONESUR 20 (A) 07/25/2017 1335   PROTEINUR 30 (A) 07/25/2017 1335   NITRITE NEGATIVE 07/25/2017 1335   LEUKOCYTESUR NEGATIVE 07/25/2017 1335    ----------------------------------------------------------------------------------------------------------------   Imaging Results:    Ct Head Code Stroke Wo Contrast  Result Date: 07/25/2017 CLINICAL DATA:  Code stroke.  Found on floor, right-sided weakness EXAM: CT HEAD WITHOUT CONTRAST TECHNIQUE: Contiguous  axial images were obtained from the base of the skull through the vertex without intravenous contrast. COMPARISON:  None. FINDINGS: Brain: No evidence of acute infarction, hemorrhage, hydrocephalus, extra-axial collection or mass lesion/mass effect. Vascular: Negative for hyperdense vessel Skull: Negative Sinuses/Orbits: Negative Other: None ASPECTS (Alberta Stroke Program Early CT Score) - Ganglionic level infarction (caudate, lentiform nuclei, internal capsule, insula, M1-M3 cortex): 7 - Supraganglionic infarction (M4-M6 cortex): 3 Total score (0-10 with 10 being normal): 10 IMPRESSION: 1. No acute intracranial abnormality 2. ASPECTS is 10 3. These results were called by telephone at the time of interpretation on 07/25/2017 at 11:24 am to Dr. Laurence SlateAroor , who verbally acknowledged these results. Electronically Signed   By: Marlan Palauharles  Clark M.D.   On: 07/25/2017 11:25      Assessment & Plan:    Active Problems:   Right sided weakness    Generalized weakness ? TIA vs UTI MRI/ MRA brain Carotid ultrasound Cardiac echo Aspirin No Statin due to abnormal liver function  UTI Awaiting urine culture Start rocephin 1gm iv qday  Abnormal liver function Check acute hepatitis panel Check RUQ ultrasound  Dm2 Cont Metformin fsbs ac and qhs, ISS  Schizophrenia/ Bipolar Cont Haldol Cont Trazodone   DVT Prophylaxis Lovenox - SCDs   AM Labs Ordered, also please review Full Orders  Family Communication: Admission, patients condition and plan of care including tests being ordered have been discussed with the patient  who indicate understanding and agree with the plan and Code Status.  Code Status  FULL CODE  Likely DC to  home  Condition GUARDED   Consults called:  none  Admission status: inpatient  Time spent in minutes : 45   Pearson GrippeJames Declyn Offield M.D on 07/25/2017 at 9:07 PM  Between 7am to 7pm - Pager - [phone] After 7pm go to [email or web address] - password [email or web address]  [facility] - Office   [phone]

## 2017-07-25 NOTE — Progress Notes (Signed)
Pt refused exam shortly after initial scanning began.

## 2017-07-25 NOTE — ED Triage Notes (Signed)
[facility] for aphasia and right sided weakness that started at 0830 this morning. Last known well at 0730.

## 2017-07-25 NOTE — Consult Note (Addendum)
Neurology Consultation  Reason for Consult: Stroke Alert Referring Physician: EDP  CC: Weakness, difficulty speaking   History is obtained from EMS.  HPI: Sherri [name] is a 62 y.o. female with a pmhx of schizophrenia presenting from a nursing home with frequent falls, generalized weakness (R>L) and difficulty speaking. Per report, patient was in her normal state of health until this morning. Nursing staff took patient for a walk around 6:45 am. She was put back in bed, last known normal was ~ 7:30 am. She was found an hour later not moving her right side and with difficulty speaking. EMS was called. BP on arrival was 180/100. Code stroke was initiated and she was transferred to [facility] ED. On arrival, weakness had already begun to improve. She was moving all extremities without focal deficit. CT head was negative for bleed or acute abnormality. Code stroke was canceled. Patient has never had a stroke before. Only past medical history is schizophrenia for which she receives monthly haldol injections. She lives in a nursing facility but is able to care for herself and perform ADLs at baseline.   LKW: 7:30 am tpa given?: no, symptoms resolved   ROS: Unable to obtain due to altered mental status.   Past Medical History:  Diagnosis Date  . Bipolar 1 disorder (HCC)   . Hypertension   . Prediabetes   . Schizophrenia (HCC)   . Tachycardia     No family history on file.  Social History:   reports that she has never smoked. She has never used smokeless tobacco. She reports that she does not drink alcohol or use drugs.  Medications No current facility-administered medications for this encounter.   Current Outpatient Medications:  .  acetaminophen (TYLENOL) 500 MG tablet, Take 2 tablets (1,000 mg total) by mouth every 8 (eight) hours as needed for mild pain., Disp: 1 tablet, Rfl: 0 .  calcium-vitamin D (OSCAL WITH D) 500-200 MG-UNIT tablet, Take 1 tablet by mouth daily with breakfast. For  bone health, Disp: 30 tablet, Rfl: 0 .  haloperidol (HALDOL) 10 MG tablet, Take 1 tablet (10 mg total) by mouth at bedtime. For mood control, Disp: 15 tablet, Rfl: 0 .  haloperidol (HALDOL) 5 MG tablet, Take 1 tablet (5 mg total) by mouth every morning. For mood control, Disp: 15 tablet, Rfl: 0 .  [START ON 08/08/2017] haloperidol decanoate (HALDOL DECANOATE) 100 MG/ML injection, Inject 1 mL (100 mg total) into the muscle every 30 (thirty) days. (Due on 08-08-17): For mood control, Disp: 1 mL, Rfl: 0 .  hydrOXYzine (ATARAX/VISTARIL) 25 MG tablet, Take 1 tablet (25 mg total) by mouth every 8 (eight) hours. For anxiety, Disp: 60 tablet, Rfl: 0 .  losartan (COZAAR) 100 MG tablet, Take 1 tablet (100 mg total) by mouth daily. For high blood pressure, Disp: 30 tablet, Rfl: 0 .  metFORMIN (GLUCOPHAGE) 500 MG tablet, Take 1 tablet (500 mg total) by mouth 2 (two) times daily with a meal. For diabetes management, Disp: 60 tablet, Rfl: 0 .  traZODone (DESYREL) 50 MG tablet, Take 1 tablet (50 mg total) by mouth at bedtime as needed for sleep., Disp: 30 tablet, Rfl: 0  Exam: Current vital signs: Wt 133 lb 6.1 oz (60.5 kg)   BMI 26.53 kg/m  Vital signs in last 24 hours: Weight:  [133 lb 6.1 oz (60.5 kg)] 133 lb 6.1 oz (60.5 kg) (03/29 1100)  GENERAL: Awake, alert in NAD HEENT: - Normocephalic and atraumatic, dry mm, no LN++, no Thyromegally LUNGS -  Breathing comfortably on RA ABDOMEN - Soft, nontender, nondistended  Ext: warm, well perfused, no edema  NEURO:  Mental Status: Alert and oriented to person and place, not time Language: speech is fluent.  Naming, repetition, and comprehension intact. Cranial Nerves: PERR, EOMI, visual fields full, no facial asymmetry, facial sensation intact, hearing intact, tongue/uvula/soft palate midline, normal sternocleidomastoid and trapezius muscle strength. No evidence of tongue atrophy or fibrillations Motor: Poor effort throughout but able to move all extremities  against gravity, no drift. No focal deficits.  Tone: is normal and bulk is normal Sensation- Intact to light touch bilaterally Coordination: FTN intact bilaterally Gait- deferred  NIHSS: 1   Labs I have reviewed labs in epic and the results pertinent to this consultation are:  CBC    Component Value Date/Time   WBC 13.8 (H) 07/25/2017 1059   RBC 5.07 07/25/2017 1059   HGB 15.6 (H) 07/25/2017 1107   HCT 46.0 07/25/2017 1107   PLT 286 07/25/2017 1059   MCV 89.0 07/25/2017 1059   MCH 29.8 07/25/2017 1059   MCHC 33.5 07/25/2017 1059   RDW 12.8 07/25/2017 1059   LYMPHSABS 1.5 07/25/2017 1059   MONOABS 1.0 07/25/2017 1059   EOSABS 0.0 07/25/2017 1059   BASOSABS 0.0 07/25/2017 1059    CMP     Component Value Date/Time   NA 137 07/25/2017 1107   K 5.1 07/25/2017 1107   CL 104 07/25/2017 1107   CO2 22 05/30/2017 1942   GLUCOSE 144 (H) 07/25/2017 1107   BUN 43 (H) 07/25/2017 1107   CREATININE 0.80 07/25/2017 1107   CALCIUM 9.8 05/30/2017 1942   PROT 8.3 (H) 05/30/2017 1942   ALBUMIN 4.9 05/30/2017 1942   AST 42 (H) 05/30/2017 1942   ALT 66 (H) 05/30/2017 1942   ALKPHOS 72 05/30/2017 1942   BILITOT 0.4 05/30/2017 1942   GFRNONAA >60 05/30/2017 1942   GFRAA >60 05/30/2017 1942    Lipid Panel  No results found for: CHOL, TRIG, HDL, CHOLHDL, VLDL, LDLCALC, LDLDIRECT   Imaging I have reviewed the images obtained:  CT-scan of the brain IMPRESSION: 1. No acute intracranial abnormality 2. ASPECTS is 10 3. These results were called by telephone at the time of interpretation on 07/25/2017 at 11:24 am to Dr. Laurence SlateAroor , who verbally acknowledged these results.  Assessment:  62 yo F with pmhx of schizophrenia on monthly haldol injections presenting with frequent falls, generalized weakness (R>L), and speech difficulties. Deficits improved on arrival to the ED. She is speaking fluently here without focal motor deficits. CT head was negative and code stroke was canceled.     Impression: Possible TIA   Sherri [name], M.D. - PGY2 07/25/2017, 11:12 AM    NEUROHOSPITALIST ADDENDUM Seen and examined the patient today. I have reviewed and agree with history and exam as documented above.   There was miscommunication regarding workup of TIA as outpatient, she has ABCD2 score of 5 making her moderate risk TIA. Would recommend inpatient workup. Spoke to EDP and requested patient to be come back to be admitted for expedited TIA workup.   Risk factors: obesity, HTN  Recommend # MRI of the brain without contrast #MRA Head and neck  #Transthoracic Echo  # Start patient on ASA 325mg  daily #Start or continue Atorvastatin 80 mg/other high intensity statin # BP goal: permissive HTN upto 220/110 mm HG # HBAIC and Lipid profile # Telemetry monitoring # Frequent neuro checks # NPO until passes stroke swallow screen  Please page stroke NP  Or  PA  Or MD from 8am -4 pm  as this patient from this time will be  followed by the stroke.   You can look them up on [email or web address]  Password [facility]   Georgiana Spinner Izzabelle Bouley MD [facility] [phone]  If 7pm to 7am, please call on call as listed on AMION.

## 2017-07-25 NOTE — ED Provider Notes (Signed)
[facility] EMERGENCY DEPARTMENT Provider Note   CSN: [id number] Arrival date & time: 07/25/17  1051   An emergency department physician performed an initial assessment on this suspected stroke patient at 1053.  History   Chief Complaint Chief Complaint  Patient presents with  . Code Stroke    HPI Sherri [name] is a 62 y.o. female.  Pt presents to the ED via EMS as a code stroke.  She lives in [facility] b/c of severe schizophrenia.  Nurses got her up for a walk at 0645 and did well.  When they went to check on her again, around 0830, she was not moving her right arm and had difficulty speaking.  By the time pt arrived here, neurologic deficits were resolving.  [place].       Past Medical History:  Diagnosis Date  . Bipolar 1 disorder ([place])   . Hypertension   . Prediabetes   . Schizophrenia ([place])   . Tachycardia     Patient Active Problem List   Diagnosis Date Noted  . 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 Schizoaffective disorder, bipolar type ([place]) 05/31/2017  . Psychosis in elderly, with behavioral disturbance 04/12/2017    Past Surgical History:  Procedure Laterality Date  . COLON SURGERY       OB History   None      Home Medications    Prior to Admission medications   Medication Sig Start Date End Date Taking? Authorizing Provider  acetaminophen (TYLENOL) 500 MG tablet Take 2 tablets (1,000 mg total) by mouth every 8 (eight) hours as needed for mild pain. 07/16/17  Yes Lindell Spar I, NP  calcium-vitamin D (OSCAL WITH D) 500-200 MG-UNIT tablet Take 1 tablet by mouth daily with breakfast. For bone health 07/17/17  Yes Lindell Spar I, NP  haloperidol (HALDOL) 10 MG tablet Take 1 tablet (10 mg total) by mouth at bedtime. For mood control 07/16/17  Yes Nwoko, Herbert Pun I, NP  haloperidol (HALDOL) 5 MG tablet Take 1 tablet (5 mg total) by mouth every morning. For mood control 07/17/17  Yes Nwoko,  Herbert Pun I, NP  haloperidol decanoate (HALDOL DECANOATE) 100 MG/ML injection Inject 1 mL (100 mg total) into the muscle every 30 (thirty) days. (Due on 08-08-17): For mood control 08/08/17  Yes Lindell Spar I, NP  hydrOXYzine (ATARAX/VISTARIL) 25 MG tablet Take 1 tablet (25 mg total) by mouth every 8 (eight) hours. For anxiety 07/16/17  Yes Nwoko, Herbert Pun I, NP  losartan (COZAAR) 100 MG tablet Take 1 tablet (100 mg total) by mouth daily. For high blood pressure 07/17/17  Yes Nwoko, Herbert Pun I, NP  metFORMIN (GLUCOPHAGE) 500 MG tablet Take 1 tablet (500 mg total) by mouth 2 (two) times daily with a meal. For diabetes management 07/16/17  Yes Lindell Spar I, NP  traZODone (DESYREL) 50 MG tablet Take 1 tablet (50 mg total) by mouth at bedtime as needed for sleep. 07/16/17  Yes Encarnacion Slates, NP    Family History No family history on file.  Social History Social History   Tobacco Use  . Smoking status: Never Smoker  . Smokeless tobacco: Never Used  Substance Use Topics  . Alcohol use: No    Frequency: Never  . Drug use: No     Allergies   Patient has no known allergies.   Review of Systems Review of Systems  Neurological: Positive for speech difficulty and weakness.  All other systems reviewed  and are negative.    Physical Exam Updated Vital Signs BP (!) 152/73   Pulse (!) 105   Temp 97.7 F (36.5 C) (Temporal)   Resp 15   Wt 60.5 kg (133 lb 6.1 oz)   SpO2 96%   BMI 26.53 kg/m   Physical Exam  Constitutional: She is oriented to person, place, and time. She appears well-developed and well-nourished.  HENT:  Head: Normocephalic and atraumatic.  Right Ear: External ear normal.  Left Ear: External ear normal.  Nose: Nose normal.  Mouth/Throat: Oropharynx is clear and moist.  Eyes: Pupils are equal, round, and reactive to light. Conjunctivae and EOM are normal.  Neck: Normal range of motion. Neck supple.  Cardiovascular: Normal rate, regular rhythm, normal heart sounds and intact  distal pulses.  Pulmonary/Chest: Effort normal and breath sounds normal.  Abdominal: Soft. Bowel sounds are normal.  Musculoskeletal: Normal range of motion.  Neurological: She is alert and oriented to person, place, and time.  Skin: Skin is warm. Capillary refill takes less than 2 seconds.  Psychiatric: She has a normal mood and affect. Her speech is normal.  Nursing note and vitals reviewed.    ED Treatments / Results  Labs (all labs ordered are listed, but only abnormal results are displayed) Labs Reviewed  CBC - Abnormal; Notable for the following components:      Result Value   WBC 13.8 (*)    Hemoglobin 15.1 (*)    All other components within normal limits  DIFFERENTIAL - Abnormal; Notable for the following components:   Neutro Abs 11.2 (*)    All other components within normal limits  COMPREHENSIVE METABOLIC PANEL - Abnormal; Notable for the following components:   CO2 20 (*)    Glucose, Bld 141 (*)    BUN 28 (*)    Calcium 10.5 (*)    AST 92 (*)    ALT 65 (*)    All other components within normal limits  URINALYSIS, ROUTINE W REFLEX MICROSCOPIC - Abnormal; Notable for the following components:   APPearance CLOUDY (*)    Hgb urine dipstick SMALL (*)    Ketones, ur 20 (*)    Protein, ur 30 (*)    Bacteria, UA RARE (*)    Squamous Epithelial / LPF 6-30 (*)    All other components within normal limits  CBG MONITORING, ED - Abnormal; Notable for the following components:   Glucose-Capillary 119 (*)    All other components within normal limits  I-STAT CHEM 8, ED - Abnormal; Notable for the following components:   BUN 43 (*)    Glucose, Bld 144 (*)    Calcium, Ion 1.14 (*)    Hemoglobin 15.6 (*)    All other components within normal limits  PROTIME-INR  APTT  I-STAT TROPONIN, ED    EKG EKG Interpretation  Date/Time:  Friday July 25 2017 11:17:11 EDT Ventricular Rate:  112 PR Interval:    QRS Duration: 93 QT Interval:  350 QTC Calculation: 478 R  Axis:   -6 Text Interpretation:  Sinus tachycardia Anterior infarct, old No significant change since last tracing Confirmed by Isla Pence [phone]) on 07/25/2017 11:23:16 AM   Radiology Ct Head Code Stroke Wo Contrast  Result Date: 07/25/2017 CLINICAL DATA:  Code stroke.  Found on floor, right-sided weakness EXAM: CT HEAD WITHOUT CONTRAST TECHNIQUE: Contiguous axial images were obtained from the base of the skull through the vertex without intravenous contrast. COMPARISON:  None. FINDINGS: Brain: No evidence of acute  infarction, hemorrhage, hydrocephalus, extra-axial collection or mass lesion/mass effect. Vascular: Negative for hyperdense vessel Skull: Negative Sinuses/Orbits: Negative Other: None ASPECTS ([place] Stroke Program Early CT Score) - Ganglionic level infarction (caudate, lentiform nuclei, internal capsule, insula, M1-M3 cortex): 7 - Supraganglionic infarction (M4-M6 cortex): 3 Total score (0-10 with 10 being normal): 10 IMPRESSION: 1. No acute intracranial abnormality 2. ASPECTS is 10 3. These results were called by telephone at the time of interpretation on 07/25/2017 at 11:24 am to Dr. Lorraine Lax , who verbally acknowledged these results. Electronically Signed   By: Franchot Gallo M.D.   On: 07/25/2017 11:25    Procedures Procedures (including critical care time)  Medications Ordered in ED Medications - No data to display   Initial Impression / Assessment and Plan / ED Course  I have reviewed the triage vital signs and the nursing notes.  Pertinent labs & imaging results that were available during my care of the patient were reviewed by me and considered in my medical decision making (see chart for details).  CT head negative.  Code stroke cancelled.  Neurology recommends no further imaging.  Ok to transfer back to facility from neuro standpoint.    Pt is feeling better.  She may have had a TIA, so she is instructed to f/u with neurology as an outpatient.  Return if  worse.  Final Clinical Impressions(s) / ED Diagnoses   Final diagnoses:  TIA (transient ischemic attack)    ED Discharge Orders    None       Isla Pence, MD 07/25/17 1443

## 2017-07-25 NOTE — ED Notes (Signed)
Delay in lab draw,  Pt not in room 

## 2017-07-25 NOTE — ED Provider Notes (Signed)
[facility]   CSN: [id number] Arrival date & time: 07/25/17  1854     History   Chief Complaint Chief Complaint  Patient presents with  . Transient Ischemic Attack    HPI Sherri [name] is a 62 y.o. female.  62 yo F with a chief complaint of strokelike symptoms.  The patient was seen in the emergency department earlier for this which had resolved prior to her getting a CT of the head.  Biaxin and she was discharged home and she was called back to be admitted to the hospital for a TIA workup.  The patient denies any worsening of her symptoms.  States that she has significant trouble walking over the past week or so.  The history is provided by the patient.  Illness  This is a new problem. The current episode started more than 1 week ago. The problem occurs constantly. The problem has not changed since onset.Pertinent negatives include no chest pain, no headaches and no shortness of breath. Nothing aggravates the symptoms. Nothing relieves the symptoms. She has tried nothing for the symptoms. The treatment provided no relief.    Past Medical History:  Diagnosis Date  . Bipolar 1 disorder ([facility])   . Hypertension   . Prediabetes   . Schizophrenia ([facility])   . Tachycardia     Patient Active Problem List   Diagnosis Date Noted  . Right sided weakness 07/25/2017  . UTI (urinary tract infection) 07/25/2017  . Abnormal liver function 07/25/2017  . Schizophrenia, disorganized, chronic ([facility]) 06/03/2017  . Schizoaffective disorder, bipolar type ([facility]) 05/31/2017  . Psychosis in elderly, with behavioral disturbance 04/12/2017    Past Surgical History:  Procedure Laterality Date  . COLON SURGERY       OB History   None      Home Medications    Prior to Admission medications   Medication Sig Start Date End Date Taking? Authorizing Provider  acetaminophen (TYLENOL) 500 MG tablet Take 2 tablets (1,000 mg total) by mouth  every 8 (eight) hours as needed for mild pain. 07/16/17  Yes Armandina StammerNwoko, Agnes I, NP  calcium-vitamin D (OSCAL WITH D) 500-200 MG-UNIT tablet Take 1 tablet by mouth daily with breakfast. For bone health 07/17/17  Yes Armandina StammerNwoko, Agnes I, NP  haloperidol (HALDOL) 10 MG tablet Take 1 tablet (10 mg total) by mouth at bedtime. For mood control 07/16/17  Yes Nwoko, Nicole KindredAgnes I, NP  haloperidol (HALDOL) 5 MG tablet Take 1 tablet (5 mg total) by mouth every morning. For mood control Patient taking differently: Take 5 mg by mouth daily. For mood control 07/17/17  Yes Nwoko, Nicole KindredAgnes I, NP  haloperidol decanoate (HALDOL DECANOATE) 100 MG/ML injection Inject 1 mL (100 mg total) into the muscle every 30 (thirty) days. (Due on 08-08-17): For mood control 08/08/17  Yes Armandina StammerNwoko, Agnes I, NP  hydrOXYzine (ATARAX/VISTARIL) 25 MG tablet Take 1 tablet (25 mg total) by mouth every 8 (eight) hours. For anxiety 07/16/17  Yes Nwoko, Nicole KindredAgnes I, NP  losartan (COZAAR) 100 MG tablet Take 1 tablet (100 mg total) by mouth daily. For high blood pressure 07/17/17  Yes Nwoko, Nicole KindredAgnes I, NP  metFORMIN (GLUCOPHAGE) 500 MG tablet Take 1 tablet (500 mg total) by mouth 2 (two) times daily with a meal. For diabetes management 07/16/17  Yes Armandina StammerNwoko, Agnes I, NP  traZODone (DESYREL) 50 MG tablet Take 1 tablet (50 mg total) by mouth at bedtime as needed for sleep. 07/16/17  Yes  Sanjuana Kava, NP    Family History History reviewed. No pertinent family history.  Social History Social History   Tobacco Use  . Smoking status: Never Smoker  . Smokeless tobacco: Never Used  Substance Use Topics  . Alcohol use: No    Frequency: Never  . Drug use: No     Allergies   Patient has no known allergies.   Review of Systems Review of Systems  Constitutional: Negative for chills and fever.  HENT: Negative for congestion and rhinorrhea.   Eyes: Negative for redness and visual disturbance.  Respiratory: Negative for shortness of breath and wheezing.   Cardiovascular:  Negative for chest pain and palpitations.  Gastrointestinal: Negative for nausea and vomiting.  Genitourinary: Negative for dysuria and urgency.  Musculoskeletal: Negative for arthralgias and myalgias.  Skin: Negative for pallor and wound.  Neurological: Positive for weakness. Negative for dizziness and headaches.     Physical Exam Updated Vital Signs BP (!) 156/76   Pulse (!) 120   Temp 98.2 F (36.8 C)   Resp 19   Ht 5\' 2"  (1.575 m)   Wt 60.3 kg (133 lb)   SpO2 96%   BMI 24.33 kg/m   Physical Exam  Constitutional: She is oriented to person, place, and time. She appears well-developed and well-nourished. No distress.  HENT:  Head: Normocephalic and atraumatic.  Eyes: Pupils are equal, round, and reactive to light. EOM are normal.  Neck: Normal range of motion. Neck supple.  Cardiovascular: Normal rate and regular rhythm. Exam reveals no gallop and no friction rub.  No murmur heard. Pulmonary/Chest: Effort normal. She has no wheezes. She has no rales.  Abdominal: Soft. She exhibits no distension. There is no tenderness.  Musculoskeletal: She exhibits no edema or tenderness.  Neurological: She is alert and oriented to person, place, and time.  Patient is globally weak in all 4 extremities may be worse to the left lower extremity.  She has signs of diffuse muscle wasting  Skin: Skin is warm and dry. She is not diaphoretic.  Psychiatric: She has a normal mood and affect. Her behavior is normal.  Nursing note and vitals reviewed.    ED Treatments / Results  Labs (all labs ordered are listed, but only abnormal results are displayed) Labs Reviewed  CBG MONITORING, ED - Abnormal; Notable for the following components:      Result Value   Glucose-Capillary 128 (*)    All other components within normal limits  HIV ANTIBODY (ROUTINE TESTING)  HEMOGLOBIN A1C  LIPID PANEL  HEPATITIS PANEL, ACUTE    EKG None  Radiology Ct Head Code Stroke Wo Contrast  Result Date:  07/25/2017 CLINICAL DATA:  Code stroke.  Found on floor, right-sided weakness EXAM: CT HEAD WITHOUT CONTRAST TECHNIQUE: Contiguous axial images were obtained from the base of the skull through the vertex without intravenous contrast. COMPARISON:  None. FINDINGS: Brain: No evidence of acute infarction, hemorrhage, hydrocephalus, extra-axial collection or mass lesion/mass effect. Vascular: Negative for hyperdense vessel Skull: Negative Sinuses/Orbits: Negative Other: None ASPECTS ([place] Stroke Program Early CT Score) - Ganglionic level infarction (caudate, lentiform nuclei, internal capsule, insula, M1-M3 cortex): 7 - Supraganglionic infarction (M4-M6 cortex): 3 Total score (0-10 with 10 being normal): 10 IMPRESSION: 1. No acute intracranial abnormality 2. ASPECTS is 10 3. These results were called by telephone at the time of interpretation on 07/25/2017 at 11:24 am to Dr. Laurence Slate , who verbally acknowledged these results. Electronically Signed   By: Marlan Palau M.D.  On: 07/25/2017 11:25    Procedures Procedures (including critical care time)  Medications Ordered in ED Medications  haloperidol (HALDOL) tablet 10 mg (10 mg Oral Given 07/25/17 2209)  haloperidol (HALDOL) tablet 5 mg (has no administration in time range)  hydrOXYzine (ATARAX/VISTARIL) tablet 25 mg (25 mg Oral Given 07/25/17 2209)  losartan (COZAAR) tablet 100 mg (has no administration in time range)  metFORMIN (GLUCOPHAGE) tablet 500 mg (has no administration in time range)  traZODone (DESYREL) tablet 50 mg (has no administration in time range)   stroke: mapping our early stages of recovery book (has no administration in time range)  acetaminophen (TYLENOL) tablet 650 mg (has no administration in time range)    Or  acetaminophen (TYLENOL) solution 650 mg (has no administration in time range)    Or  acetaminophen (TYLENOL) suppository 650 mg (has no administration in time range)  aspirin suppository 300 mg (has no administration in  time range)    Or  aspirin tablet 325 mg (has no administration in time range)  cefTRIAXone (ROCEPHIN) 1 g in sodium chloride 0.9 % 100 mL IVPB (1 g Intravenous New Bag/Given 07/25/17 2208)     Initial Impression / Assessment and Plan / ED Course  I have reviewed the triage vital signs and the nursing notes.  Pertinent labs & imaging results that were available during my care of the patient were reviewed by me and considered in my medical decision making (see chart for details).     62 yo F here for admission for TIA rule out.  MRI MRA brain ordered. Admit.   The patients results and plan were reviewed and discussed.   Any x-rays performed were independently reviewed by myself.   Differential diagnosis were considered with the presenting HPI.  Medications  haloperidol (HALDOL) tablet 10 mg (10 mg Oral Given 07/25/17 2209)  haloperidol (HALDOL) tablet 5 mg (has no administration in time range)  hydrOXYzine (ATARAX/VISTARIL) tablet 25 mg (25 mg Oral Given 07/25/17 2209)  losartan (COZAAR) tablet 100 mg (has no administration in time range)  metFORMIN (GLUCOPHAGE) tablet 500 mg (has no administration in time range)  traZODone (DESYREL) tablet 50 mg (has no administration in time range)   stroke: mapping our early stages of recovery book (has no administration in time range)  acetaminophen (TYLENOL) tablet 650 mg (has no administration in time range)    Or  acetaminophen (TYLENOL) solution 650 mg (has no administration in time range)    Or  acetaminophen (TYLENOL) suppository 650 mg (has no administration in time range)  aspirin suppository 300 mg (has no administration in time range)    Or  aspirin tablet 325 mg (has no administration in time range)  cefTRIAXone (ROCEPHIN) 1 g in sodium chloride 0.9 % 100 mL IVPB (1 g Intravenous New Bag/Given 07/25/17 2208)    Vitals:   07/25/17 2022 07/25/17 2026 07/25/17 2045 07/25/17 2115  BP:   (!) 148/68 (!) 156/76  Pulse:   (!) 107 (!) 120    Resp:   18 19  Temp: 98.2 F (36.8 C)     TempSrc:      SpO2:   96% 96%  Weight:  60.3 kg (133 lb)    Height:  5\' 2"  (1.575 m)      Final diagnoses:  TIA (transient ischemic attack)  Abnormal liver function    Admission/ observation were discussed with the admitting physician, patient and/or family and they are comfortable with the plan.    Final Clinical  Impressions(s) / ED Diagnoses   Final diagnoses:  TIA (transient ischemic attack)  Abnormal liver function    ED Discharge Orders    None       Melene Plan, DO 07/25/17 2251

## 2017-07-26 ENCOUNTER — Encounter (HOSPITAL_COMMUNITY): Payer: Self-pay

## 2017-07-26 ENCOUNTER — Inpatient Hospital Stay (HOSPITAL_COMMUNITY): Payer: Non-veteran care

## 2017-07-26 DIAGNOSIS — R531 Weakness: Secondary | ICD-10-CM | POA: Diagnosis not present

## 2017-07-26 DIAGNOSIS — G459 Transient cerebral ischemic attack, unspecified: Secondary | ICD-10-CM | POA: Diagnosis present

## 2017-07-26 DIAGNOSIS — I5030 Unspecified diastolic (congestive) heart failure: Secondary | ICD-10-CM | POA: Diagnosis not present

## 2017-07-26 LAB — LIPID PANEL
Cholesterol: 197 mg/dL (ref 0–200)
HDL: 49 mg/dL (ref >40)
LDL CALC: 122 mg/dL — AB (ref 0–99)
TRIGLYCERIDES: 130 mg/dL (ref <150)
Total CHOL/HDL Ratio: 4 RATIO
VLDL: 26 mg/dL (ref 0–40)

## 2017-07-26 LAB — GLUCOSE, CAPILLARY
GLUCOSE-CAPILLARY: 114 mg/dL — AB (ref 65–99)
GLUCOSE-CAPILLARY: 106 mg/dL — AB (ref 65–99)

## 2017-07-26 LAB — HEMOGLOBIN A1C
HEMOGLOBIN A1C: 6.4 % — AB (ref 4.8–5.6)
Mean Plasma Glucose: 136.98 mg/dL

## 2017-07-26 LAB — HIV ANTIBODY (ROUTINE TESTING W REFLEX): HIV Screen 4th Generation wRfx: NONREACTIVE

## 2017-07-26 LAB — ECHOCARDIOGRAM COMPLETE
Height: 62 in
Weight: 2128 oz

## 2017-07-26 MED ORDER — ATORVASTATIN CALCIUM 40 MG PO TABS
40.0000 mg | ORAL_TABLET | Freq: Every day | ORAL | Status: DC
  Administered 2017-07-26 – 2017-08-06 (×12): 40 mg via ORAL
  Filled 2017-07-26 (×11): qty 1

## 2017-07-26 MED ORDER — INSULIN ASPART 100 UNIT/ML ~~LOC~~ SOLN: 0.0000 [IU] | Freq: Every day | SUBCUTANEOUS | Status: DC

## 2017-07-26 MED ORDER — INSULIN ASPART 100 UNIT/ML ~~LOC~~ SOLN
0.0000 [IU] | Freq: Three times a day (TID) | SUBCUTANEOUS | Status: DC
  Administered 2017-07-28: 2 [IU] via SUBCUTANEOUS
  Administered 2017-07-29 – 2017-08-01 (×2): 1 [IU] via SUBCUTANEOUS

## 2017-07-26 NOTE — Progress Notes (Signed)
STROKE TEAM PROGRESS NOTE   HISTORY OF PRESENT ILLNESS (per record) Sherri [name] is a 62 y.o. female with a pmhx of schizophrenia presenting from a nursing home with frequent falls, generalized weakness (R>L) and difficulty speaking. Per report, patient was in her normal state of health until this morning. Nursing staff took patient for a walk around 6:45 am. She was put back in bed, last known normal was ~ 7:30 am. She was found an hour later not moving her right side and with difficulty speaking. EMS was called. BP on arrival was 180/100. Code stroke was initiated and she was [facility] ED. On arrival, weakness had already begun to improve. She was moving all extremities without focal deficit. CT head was negative for bleed or acute abnormality. Code stroke was canceled. Patient has never had a stroke before. Only past medical history is schizophrenia for which she receives monthly haldol injections. She lives in a nursing facility but is able to care for herself and perform ADLs at baseline.   LKW: 7:30 am tpa given?: no, symptoms resolved    SUBJECTIVE (INTERVAL HISTORY) Her family is not at bedside, symptoms resolved    OBJECTIVE Temp:  [98.2 F (36.8 C)-99.1 F (37.3 C)] 99.1 F (37.3 C) (03/30 1342) Pulse Rate:  [95-120] 108 (03/30 1342) Cardiac Rhythm: Sinus tachycardia;Other (Comment) (03/30 1316) Resp:  [14-20] 16 (03/30 1342) BP: (129-165)/(56-95) 165/86 (03/30 1342) SpO2:  [95 %-97 %] 96 % (03/30 1342) Weight:  [133 lb (60.3 kg)] 133 lb (60.3 kg) (03/29 2026)  CBC:  Recent Labs  Lab 07/25/17 1059 07/25/17 1107  WBC 13.8*  --   NEUTROABS 11.2*  --   HGB 15.1* 15.6*  HCT 45.1 46.0  MCV 89.0  --   PLT 286  --     Basic Metabolic Panel:  Recent Labs  Lab 07/25/17 1059 07/25/17 1107  NA 138 137  K 3.6 5.1  CL 103 104  CO2 20*  --   GLUCOSE 141* 144*  BUN 28* 43*  CREATININE 0.94 0.80  CALCIUM 10.5*  --     Lipid Panel:     Component Value  Date/Time   CHOL 197 07/26/2017 0339   TRIG 130 07/26/2017 0339   HDL 49 07/26/2017 0339   CHOLHDL 4.0 07/26/2017 0339   VLDL 26 07/26/2017 0339   LDLCALC 122 (H) 07/26/2017 0339   HgbA1c:  Lab Results  Component Value Date   HGBA1C 6.4 (H) 07/26/2017   Urine Drug Screen:     Component Value Date/Time   LABOPIA NONE DETECTED 05/30/2017 1857   COCAINSCRNUR NONE DETECTED 05/30/2017 1857   LABBENZ NONE DETECTED 05/30/2017 1857   AMPHETMU NONE DETECTED 05/30/2017 1857   THCU NONE DETECTED 05/30/2017 1857   LABBARB NONE DETECTED 05/30/2017 1857    Alcohol Level     Component Value Date/Time   ETH <10 05/30/2017 1942    IMAGING  Ct Head Code Stroke Wo Contrast 07/25/2017 IMPRESSION:  1. No acute intracranial abnormality  2. ASPECTS is 10 3.    [place] Abdomen Limited Ruq 07/25/2017 IMPRESSION:  1. Negative for gallstones or biliary dilatation  2. Increased hepatic echogenicity consistent with steatosis and or hepatocellular disease.    MR MRA Head and Neck - pending   Transthoracic Echocardiogram - pending 00/00/00   Bilateral Carotid Dopplers - pending 00/00/00     PHYSICAL EXAM Vitals:   07/26/17 0845 07/26/17 0900 07/26/17 1200 07/26/17 1342  BP:  (!) 159/84 (!) 159/56 Marland Kitchen(!)  165/86  Pulse:   (!) 118 (!) 108  Resp: 18 19 18 16   Temp:    99.1 F (37.3 C)  TempSrc:    Oral  SpO2:   96% 96%  Weight:      Height:       Physical exam: Exam: Gen: NAD, conversant, well nourised, obese, well groomed                     CV: RRR, no MRG. No Carotid Bruits. No peripheral edema, warm, nontender Eyes: Conjunctivae clear without exudates or hemorrhage  Neuro: Detailed Neurologic Exam  Speech:    Speech is normal; fluent and spontaneous with normal comprehension.  Cognition:    The patient is oriented to person, poor historian, poor effort on exam Cranial Nerves:    The pupils are equal, round, and reactive to light. Visual fields are full to finger  confrontation. Extraocular movements are intact. Trigeminal sensation is intact and the muscles of mastication are normal. The face is symmetric. The palate elevates in the midline. Hearing intact. Voice is normal. Shoulder shrug is normal. The tongue has normal motion without fasciculations.   Motor Observation:    No asymmetry, no atrophy, and no involuntary movements noted. Tone:    Normal muscle tone.      Strength:    Strength is equal and antigravity no focal deficits noted     Sensation: intact to LT      ASSESSMENT/PLAN Sherri [name] is a 62 y.o. female with history of schizophrenia, bipolar disorder, diabetes mellitus and hypertension presenting with right-sided weakness and speech difficulty. She did not receive IV t-PA due to resolution of deficits.  Possible TIA:    Resultant no deficits  CT head -  No acute intracranial abnormality   MRI head - pending  MRA H&N - pending  Carotid Doppler - MRA neck  2D Echo - pending  LDL - 122  HgbA1c - 6.4  VTE prophylaxis - SCDs  Fall precautions  Diet heart healthy/carb modified Room service appropriate? Yes; Fluid consistency: Thin  No antithrombotic prior to admission, now on aspirin 325 mg daily  Patient counseled to be compliant with her antithrombotic medications  Ongoing aggressive stroke risk factor management  Therapy recommendations:  pending  Disposition:  Pending  Hypertension  Stable  Permissive hypertension (OK if < 220/120) but gradually normalize in 5-7 days  Long-term BP goal normotensive  Hyperlipidemia  Lipid lowering medication PTA:  none  LDL 122, goal < 70  Current lipid lowering medication: Start Lipitor 40 mg daily  Continue statin at discharge  Diabetes  HgbA1c 6.4, goal < 7.0  Controlled  Other Stroke Risk Factors  Advanced age   Other Active Problems  UTI -> IV Rocephin  BUN -43  Leukocytosis -13.8 - Temp 99.1 (currently being treated for a  UTI)  Schizophrenia / bipolar disorder   Plan / Recommendations   Stroke workup - Await MRI / MRA, 2D echo and therapy evaluations.  If patient cannot tolerate MRI can repeat CT   Hospital day # 1  Personally examined patient and images, and have participated in and made any corrections needed to history, physical, neuro exam,assessment and plan as stated above.  I have personally obtained the history, evaluated lab date, reviewed imaging studies and agree with radiology interpretations.    Naomie Dean, MD Stroke Neurology  To contact Stroke Continuity provider, please refer to [email or web address]. After hours, contact General Neurology

## 2017-07-26 NOTE — ED Notes (Signed)
Pt spoke with MD, only wants something to calm her down and to go home. Pt refusing to have more testing done, tentative plan is to leave AMA

## 2017-07-26 NOTE — Progress Notes (Signed)
  Echocardiogram 2D Echocardiogram has been performed.  Sherri [name], Sherri [name] R 07/26/2017, 3:18 PM

## 2017-07-26 NOTE — ED Notes (Signed)
Pt states she wants to go home and does not want to have any imaging done. MD paged

## 2017-07-26 NOTE — Evaluation (Signed)
Physical Therapy Evaluation Patient Details Name: Sherri [name] MRN: [id number] DOB: 1956-01-30 Today's Date: 07/26/2017   History of Present Illness  Sherri [name]  is a 62 y.o. female, w Bipolar, Schizophrenis, hypertension, glucose intolerance, apparently c/o right sided weakness and inability to walk.  Clinical Impression  Pt admitted with above diagnosis. Pt currently with functional limitations due to the deficits listed below (see PT Problem List). Pt with R sided weakness, could achieve standing with mod A but could not step R foot to ambulate safely. Recommend PT at ALF as long as they will take her back at current level.  Pt will benefit from skilled PT to increase their independence and safety with mobility to allow discharge to the venue listed below.       Follow Up Recommendations Supervision for mobility/OOB;Home health PT(at ALF)    Equipment Recommendations  Other (comment)(TBD)    Recommendations for Other Services OT consult;Speech consult     Precautions / Restrictions Precautions Precautions: Fall Restrictions Weight Bearing Restrictions: No      Mobility  Bed Mobility Overal bed mobility: Needs Assistance Bed Mobility: Supine to Sit;Sit to Supine     Supine to sit: Mod assist Sit to supine: Mod assist   General bed mobility comments: pt able to initiate sitting up but needed mod A for LE's off bed and for scooting to edge. Pt had some confusion with instructed tasks and slow to respond physically. Mod A for return to supine and positioning in bed  Transfers Overall transfer level: Needs assistance Equipment used: 1 person hand held assist Transfers: Sit to/from Stand Sit to Stand: Mod assist         General transfer comment: mod A for power up and vc's for erect posture. Pt fatigued quickly and needed to sit but was able to perform 2 more times. Strong posterior lean with inabiltiy to correct  Ambulation/Gait Ambulation/Gait assistance:  Mod assist           General Gait Details: [facility] but pt could not step R foot, only L  Stairs            Wheelchair Mobility    Modified Rankin (Stroke Patients Only)       Balance Overall balance assessment: Needs assistance Sitting-balance support: Single extremity supported Sitting balance-Leahy Scale: Fair   Postural control: Posterior lean Standing balance support: Bilateral upper extremity supported Standing balance-Leahy Scale: Poor Standing balance comment: needs UE support to prevent posterior fall                             Pertinent Vitals/Pain Pain Assessment: No/denies pain    Home Living Family/patient expects to be discharged to:: Assisted living               Home Equipment: None Additional Comments: pt has lived at [facility] per her reports    Prior Function Level of Independence: Needs assistance   Gait / Transfers Assistance Needed: she reports that she ambulated without AD (unclear of her reliability)  ADL's / Homemaking Assistance Needed: she reports that she just showered on her own last week but has fallen several times and has been needing some assist with ADL's        Hand Dominance   Dominant Hand: Right    Extremity/Trunk Assessment   Upper Extremity Assessment Upper Extremity Assessment: Defer to OT evaluation;RUE deficits/detail RUE Deficits / Details: RUE  weaker than L though decreased grip strength noted bilaterally RUE Coordination: decreased fine motor;decreased gross motor    Lower Extremity Assessment Lower Extremity Assessment: RLE deficits/detail;LLE deficits/detail;Generalized weakness RLE Deficits / Details: hip flex 2-/5, knee ext 3+/5, ankle df/pf 3/5, noted inversion RLE Sensation: WNL RLE Coordination: decreased gross motor LLE Deficits / Details: generalized weakness noted, grossly 3/5, stronger than R LLE Sensation: WNL LLE Coordination: WNL    Cervical  / Trunk Assessment Cervical / Trunk Assessment: Kyphotic  Communication   Communication: Other (comment)(slow)  Cognition Arousal/Alertness: Awake/alert Behavior During Therapy: WFL for tasks assessed/performed Overall Cognitive Status: No family/caregiver present to determine baseline cognitive functioning                                 General Comments: per chart, pt with cognitive impairments at baseline. She was able to remember my name throughout session and speak about her prior functional status, but no family present to verify. Speech was slow and deliberate and she did tend to perseverate on topics.      General Comments      Exercises     Assessment/Plan    PT Assessment Patient needs continued PT services  PT Problem List Decreased strength;Decreased activity tolerance;Decreased balance;Decreased mobility;Decreased coordination;Decreased cognition;Decreased knowledge of use of DME;Decreased safety awareness;Decreased knowledge of precautions;Decreased range of motion       PT Treatment Interventions DME instruction;Gait training;Functional mobility training;Therapeutic activities;Therapeutic exercise;Balance training;Neuromuscular re-education;Cognitive remediation;Patient/family education    PT Goals (Current goals can be found in the Care Plan section)  Acute Rehab PT Goals Patient Stated Goal: firgure out what is wrong with her PT Goal Formulation: With patient Time For Goal Achievement: 08/09/17 Potential to Achieve Goals: Good    Frequency Min 3X/week   Barriers to discharge        Co-evaluation               AM-PAC PT "6 Clicks" Daily Activity  Outcome Measure Difficulty turning over in bed (including adjusting bedclothes, sheets and blankets)?: Unable Difficulty moving from lying on back to sitting on the side of the bed? : Unable Difficulty sitting down on and standing up from a chair with arms (e.g., wheelchair, bedside commode,  etc,.)?: Unable Help needed moving to and from a bed to chair (including a wheelchair)?: A Lot Help needed walking in hospital room?: Total Help needed climbing 3-5 steps with a railing? : Total 6 Click Score: 7    End of Session Equipment Utilized During Treatment: Gait belt Activity Tolerance: Patient limited by fatigue Patient left: in bed;with call bell/phone within reach;with bed alarm set Nurse Communication: Mobility status PT Visit Diagnosis: Unsteadiness on feet (R26.81);Repeated falls (R29.6);Muscle weakness (generalized) (M62.81);Difficulty in walking, not elsewhere classified (R26.2);Hemiplegia and hemiparesis Hemiplegia - Right/Left: Right Hemiplegia - dominant/non-dominant: Dominant Hemiplegia - caused by: Unspecified    Time: 1359-1420 PT Time Calculation (min) (ACUTE ONLY): 21 min   Charges:   PT Evaluation $PT Eval Moderate Complexity: 1 Mod     PT G Codes:        Lyanne Co, PT  Acute Rehab Services  [phone]   [place] L Mozelle Remlinger 07/26/2017, 4:03 PM

## 2017-07-26 NOTE — ED Notes (Signed)
Spoke with Vernona RiegerLaura from [facility] to give report for pt to return to her facility, staff stated pt is not capable of making decisions for her care on her own and pt may not make the choice to leave AMA. Vernona RiegerLaura stated she will come to the ED to attempt convincing pt to have scans done and stay at the hospital.

## 2017-07-26 NOTE — Progress Notes (Signed)
Patient ID: Sherri [name], female   DOB: 08-20-1955, 62 y.o.   MRN: [id number]  PROGRESS NOTE    Sherri [name]  WUJ:[id number] DOB: 08-20-1955 DOA: 07/25/2017 [facility]   Outpatient Specialists:    Brief Narrative:  Sherri [name]  is a 62 y.o. female, w Bipolar, Schizophrenis, hypertension, glucose intolerance, apparently c/o generalized weakness who was admitted with sudden onset of right sided weakness. Patient is awake, alert, and appears to have insight into her condition.. She appers to have delirium. Normal CT head. Patient refusing MRI. Neurology Consulted.    Assessment & Plan:   Principal Problem:   Right sided weakness Active Problems:   UTI (urinary tract infection)   Abnormal liver function   TIA (transient ischemic attack)   #1. Right Sided Weakness: patient has possible CVA/TIA. Still weak but I suspect psych component. refusing MRI and other workup but willing to have repeat CT tomorrow.  #2 UTI: Continue Rocephin while waiting for Culture and Sensitivity.  #3 DM2: Controlled.  #4 Schizophrenia: Continue Trazodone and Haldol  #5 Abnormal LFTs: Liver tests pending.   DVT prophylaxis: Lovenox Code Status: Full Family Communication: None available Disposition Plan: ALF Consultants:   Neurology   Procedures: None Antimicrobials: Rocephin  Subjective: Patient has been refusing all care. She wants to go back home and threatened to leave against Medical advice.  Objective: Vitals:   07/26/17 0815 07/26/17 0830 07/26/17 0845 07/26/17 0900  BP:    (!) 159/84  Pulse:      Resp: 20 17 18 19   Temp:      TempSrc:      SpO2:      Weight:      Height:        Intake/Output Summary (Last 24 hours) at 07/26/2017 1037 Last data filed at 07/25/2017 2238 Gross per 24 hour  Intake 100 ml  Output -  Net 100 ml   Filed Weights   07/25/17 1915 07/25/17 2026  Weight: 60.3 kg (133 lb) 60.3 kg (133 lb)     Examination:  General exam: Appears calm and comfortable  Respiratory system: Clear to auscultation. Respiratory effort normal. Cardiovascular system: S1 & S2 heard, RRR. No JVD, murmurs, rubs, gallops or clicks. No pedal edema. Gastrointestinal system: Abdomen is nondistended, soft and nontender. No organomegaly or masses felt. Normal bowel sounds heard. Central nervous system: Alert and oriented. No focal neurological deficits. Extremities: Symmetric 5 x 5 power. Skin: No rashes, lesions or ulcers Psychiatry: Judgement and insight appear normal. Mood & affect appropriate.     Data Reviewed: I have personally reviewed following labs and imaging studies  CBC: Recent Labs  Lab 07/25/17 1059 07/25/17 1107  WBC 13.8*  --   NEUTROABS 11.2*  --   HGB 15.1* 15.6*  HCT 45.1 46.0  MCV 89.0  --   PLT 286  --    Basic Metabolic Panel: Recent Labs  Lab 07/25/17 1059 07/25/17 1107  NA 138 137  K 3.6 5.1  CL 103 104  CO2 20*  --   GLUCOSE 141* 144*  BUN 28* 43*  CREATININE 0.94 0.80  CALCIUM 10.5*  --    GFR: Estimated Creatinine Clearance: 62.4 mL/min (by C-G formula based on SCr of 0.8 mg/dL). Liver Function Tests: Recent Labs  Lab 07/25/17 1059  AST 92*  ALT 65*  ALKPHOS 77  BILITOT 1.2  PROT 7.7  ALBUMIN 4.6   No results for input(s): LIPASE, AMYLASE in the last 168 hours.  No results for input(s): AMMONIA in the last 168 hours. Coagulation Profile: Recent Labs  Lab 07/25/17 1059  INR 0.97   Cardiac Enzymes: No results for input(s): CKTOTAL, CKMB, CKMBINDEX, TROPONINI in the last 168 hours. BNP (last 3 results) No results for input(s): PROBNP in the last 8760 hours. HbA1C: Recent Labs    07/26/17 0338  HGBA1C 6.4*   CBG: Recent Labs  Lab 07/25/17 1247 07/25/17 1906  GLUCAP 119* 128*   Lipid Profile: Recent Labs    07/26/17 0339  CHOL 197  HDL 49  LDLCALC 122*  TRIG 130  CHOLHDL 4.0   Thyroid Function Tests: No results for input(s):  TSH, T4TOTAL, FREET4, T3FREE, THYROIDAB in the last 72 hours. Anemia Panel: No results for input(s): VITAMINB12, FOLATE, FERRITIN, TIBC, IRON, RETICCTPCT in the last 72 hours. Urine analysis:    Component Value Date/Time   COLORURINE YELLOW 07/25/2017 1335   APPEARANCEUR CLOUDY (A) 07/25/2017 1335   LABSPEC 1.017 07/25/2017 1335   PHURINE 5.0 07/25/2017 1335   GLUCOSEU NEGATIVE 07/25/2017 1335   HGBUR SMALL (A) 07/25/2017 1335   BILIRUBINUR NEGATIVE 07/25/2017 1335   KETONESUR 20 (A) 07/25/2017 1335   PROTEINUR 30 (A) 07/25/2017 1335   NITRITE NEGATIVE 07/25/2017 1335   LEUKOCYTESUR NEGATIVE 07/25/2017 1335   Sepsis Labs: @LABRCNTIP (procalcitonin:4,lacticidven:4)  )No results found for this or any previous visit (from the past 240 hour(s)).       Radiology Studies: Ct Head Code Stroke Wo Contrast  Result Date: 07/25/2017 CLINICAL DATA:  Code stroke.  Found on floor, right-sided weakness EXAM: CT HEAD WITHOUT CONTRAST TECHNIQUE: Contiguous axial images were obtained from the base of the skull through the vertex without intravenous contrast. COMPARISON:  None. FINDINGS: Brain: No evidence of acute infarction, hemorrhage, hydrocephalus, extra-axial collection or mass lesion/mass effect. Vascular: Negative for hyperdense vessel Skull: Negative Sinuses/Orbits: Negative Other: None ASPECTS (Alberta Stroke Program Early CT Score) - Ganglionic level infarction (caudate, lentiform nuclei, internal capsule, insula, M1-M3 cortex): 7 - Supraganglionic infarction (M4-M6 cortex): 3 Total score (0-10 with 10 being normal): 10 IMPRESSION: 1. No acute intracranial abnormality 2. ASPECTS is 10 3. These results were called by telephone at the time of interpretation on 07/25/2017 at 11:24 am to Dr. Laurence Slate , who verbally acknowledged these results. Electronically Signed   By: Marlan Palau M.D.   On: 07/25/2017 11:25   US Abdomen Limited Ruq  Result Date: 07/25/2017 CLINICAL DATA:  Increased LFT EXAM:  ULTRASOUND ABDOMEN LIMITED RIGHT UPPER QUADRANT COMPARISON:  None. FINDINGS: Gallbladder: No gallstones or wall thickening visualized. No sonographic Murphy sign noted by sonographer. Common bile duct: Diameter: 4.9 mm Liver: Increased echogenicity. No focal hepatic abnormality. Portal vein is patent on color Doppler imaging with normal direction of blood flow towards the liver. Incidental note made of cysts in the mid right kidney measuring up to 2.2 cm. IMPRESSION: 1. Negative for gallstones or biliary dilatation 2. Increased hepatic echogenicity consistent with steatosis and or hepatocellular disease. Electronically Signed   By: Jasmine Pang M.D.   On: 07/25/2017 23:40        Scheduled Meds: .  stroke: mapping our early stages of recovery book   Does not apply Once  . aspirin  300 mg Rectal Daily   Or  . aspirin  325 mg Oral Daily  . haloperidol  10 mg Oral QHS  . haloperidol  5 mg Oral BH-q7a  . hydrOXYzine  25 mg Oral Q8H  . losartan  100 mg Oral Daily  .  metFORMIN  500 mg Oral BID WC   Continuous Infusions: . cefTRIAXone (ROCEPHIN)  IV Stopped (07/25/17 2238)     LOS: 1 day    Time spent: 33 minutes    GARBA,LAWAL, MD [facility] Pager [phone]  If 7PM-7AM, please contact night-coverage [email or web address] Password [facility] 07/26/2017, 10:37 AM

## 2017-07-27 ENCOUNTER — Inpatient Hospital Stay (HOSPITAL_COMMUNITY): Payer: Non-veteran care

## 2017-07-27 DIAGNOSIS — N390 Urinary tract infection, site not specified: Principal | ICD-10-CM

## 2017-07-27 DIAGNOSIS — R531 Weakness: Secondary | ICD-10-CM | POA: Diagnosis not present

## 2017-07-27 LAB — HEPATITIS PANEL, ACUTE
HEP B S AG: NEGATIVE
Hep A IgM: NEGATIVE
Hep B C IgM: NEGATIVE

## 2017-07-27 LAB — GLUCOSE, CAPILLARY
GLUCOSE-CAPILLARY: 109 mg/dL — AB (ref 65–99)
GLUCOSE-CAPILLARY: 109 mg/dL — AB (ref 65–99)
GLUCOSE-CAPILLARY: 122 mg/dL — AB (ref 65–99)
Glucose-Capillary: 107 mg/dL — ABNORMAL HIGH (ref 65–99)

## 2017-07-27 NOTE — Progress Notes (Signed)
STROKE TEAM PROGRESS NOTE   HISTORY OF PRESENT ILLNESS (per record) Sherri [name] is a 62 y.o. female with a pmhx of schizophrenia presenting from a nursing home with frequent falls, generalized weakness (R>L) and difficulty speaking. Per report, patient was in her normal state of health until this morning. Nursing staff took patient for a walk around 6:45 am. She was put back in bed, last known normal was ~ 7:30 am. She was found an hour later not moving her right side and with difficulty speaking. EMS was called. BP on arrival was 180/100. Code stroke was initiated and she was transferred to [facility] ED. On arrival, weakness had already begun to improve. She was moving all extremities without focal deficit. CT head was negative for bleed or acute abnormality. Code stroke was canceled. Patient has never had a stroke before. Only past medical history is schizophrenia for which she receives monthly haldol injections. She lives in a nursing facility but is able to care for herself and perform ADLs at baseline.   LKW: 7:30 am tpa given?: no, symptoms resolved    SUBJECTIVE (INTERVAL HISTORY) Her family is not at bedside, no focal deficits    OBJECTIVE Temp:  [97.8 F (36.6 C)-99.1 F (37.3 C)] 98.5 F (36.9 C) (03/31 0841) Pulse Rate:  [89-118] 89 (03/31 0841) Cardiac Rhythm: Normal sinus rhythm (03/31 0700) Resp:  [16-20] 16 (03/31 0841) BP: (132-168)/(56-86) 145/72 (03/31 0841) SpO2:  [96 %-98 %] 96 % (03/31 0841)  CBC:  Recent Labs  Lab 07/25/17 1059 07/25/17 1107  WBC 13.8*  --   NEUTROABS 11.2*  --   HGB 15.1* 15.6*  HCT 45.1 46.0  MCV 89.0  --   PLT 286  --     Basic Metabolic Panel:  Recent Labs  Lab 07/25/17 1059 07/25/17 1107  NA 138 137  K 3.6 5.1  CL 103 104  CO2 20*  --   GLUCOSE 141* 144*  BUN 28* 43*  CREATININE 0.94 0.80  CALCIUM 10.5*  --     Lipid Panel:     Component Value Date/Time   CHOL 197 07/26/2017 0339   TRIG 130 07/26/2017 0339   HDL  49 07/26/2017 0339   CHOLHDL 4.0 07/26/2017 0339   VLDL 26 07/26/2017 0339   LDLCALC 122 (H) 07/26/2017 0339   HgbA1c:  Lab Results  Component Value Date   HGBA1C 6.4 (H) 07/26/2017   Urine Drug Screen:     Component Value Date/Time   LABOPIA NONE DETECTED 05/30/2017 1857   COCAINSCRNUR NONE DETECTED 05/30/2017 1857   LABBENZ NONE DETECTED 05/30/2017 1857   AMPHETMU NONE DETECTED 05/30/2017 1857   THCU NONE DETECTED 05/30/2017 1857   LABBARB NONE DETECTED 05/30/2017 1857    Alcohol Level     Component Value Date/Time   ETH <10 05/30/2017 1942    IMAGING  Ct Head Code Stroke Wo Contrast 07/25/2017 IMPRESSION:  1. No acute intracranial abnormality  2. ASPECTS is 10    Ct Head Code Stroke Wo Contrast - pending 07/27/2017    US Abdomen Limited Ruq 07/25/2017 IMPRESSION:  1. Negative for gallstones or biliary dilatation  2. Increased hepatic echogenicity consistent with steatosis and or hepatocellular disease.    MR MRA Head and Neck - pending  (pt refused MRI today)    Transthoracic Echocardiogram  00/00/00 Study Conclusions - Left ventricle: The cavity size was normal. Wall thickness was   increased in a pattern of mild LVH. Systolic function was  vigorous. The estimated ejection fraction was in the range of 65%   to 70%. There was dynamic obstruction. There was dynamic   obstruction, with a peak velocity of 254 cm/sec and a peak   gradient of 26 mm Hg. Wall motion was normal; there were no   regional wall motion abnormalities. Doppler parameters are   consistent with abnormal left ventricular relaxation (grade 1   diastolic dysfunction). Impressions: - No cardiac source of emboli was indentified.    Bilateral Carotid Dopplers - pending 00/00/00     PHYSICAL EXAM Vitals:   07/26/17 1956 07/27/17 0044 07/27/17 0439 07/27/17 0841  BP: (!) 168/83 135/62 132/77 (!) 145/72  Pulse: 100 91 100 89  Resp: 18 16 16 16   Temp: 98.1 F (36.7 C) 97.8 F  (36.6 C) 98 F (36.7 C) 98.5 F (36.9 C)  TempSrc: Axillary Oral Axillary Oral  SpO2: 96% 96% 98% 96%  Weight:      Height:       Physical exam: Exam: Gen: NAD, conversant, well nourised, obese, well groomed                     CV: RRR, no MRG. No Carotid Bruits. No peripheral edema, warm, nontender Eyes: Conjunctivae clear without exudates or hemorrhage  Neuro: Detailed Neurologic Exam  Speech:    Speech is normal; fluent and spontaneous with normal comprehension.  Cognition:    The patient is oriented to person, poor historian, poor effort on exam Cranial Nerves:    The pupils are equal, round, and reactive to light. Visual fields are full to finger confrontation. Extraocular movements are intact. Trigeminal sensation is intact and the muscles of mastication are normal. The face is symmetric. The palate elevates in the midline. Hearing intact. Voice is normal. Shoulder shrug is normal. The tongue has normal motion without fasciculations.   Motor Observation:    No asymmetry, no atrophy, and no involuntary movements noted. Tone:    Normal muscle tone.      Strength:    Strength is equal and antigravity no focal deficits noted     Sensation: intact to LT      ASSESSMENT/PLAN Sherri [name] is a 62 y.o. female with history of schizophrenia, bipolar disorder, diabetes mellitus and hypertension presenting with right-sided weakness and speech difficulty. She did not receive IV t-PA due to resolution of deficits.  Possible TIA:    Resultant no deficits  CT head -  No acute intracranial abnormality   CT Head repet negative    MRI head - pending - pt refusing MRI  MRA H&N - pending - pt refusing  Carotid Doppler - will reorder carotid dopplers if pt will not undergo MRI / MRA  2D Echo - EF 65-70%. No cardiac source of emboli was indentified.  LDL - 122  HgbA1c - 6.4  VTE prophylaxis - SCDs Fall precautions Diet heart healthy/carb modified Room service  appropriate? Yes; Fluid consistency: Thin  No antithrombotic prior to admission, now on aspirin 325 mg daily  Patient counseled to be compliant with her antithrombotic medications  Ongoing aggressive stroke risk factor management  Therapy recommendations: HHPT  Disposition:  Pending  Hypertension  Stable  Permissive hypertension (OK if < 220/120) but gradually normalize in 5-7 days  Long-term BP goal normotensive  Hyperlipidemia  Lipid lowering medication PTA:  none  LDL 122, goal < 70  Current lipid lowering medication: Start Lipitor 40 mg daily  Continue statin at discharge  Diabetes  HgbA1c 6.4, goal < 7.0  Controlled  Other Stroke Risk Factors  Advanced age   Other Active Problems  UTI -> IV Rocephin  BUN -43  Leukocytosis -13.8 - Temp 99.1 (currently being treated for a UTI) Now afebrile  Schizophrenia / bipolar disorder  Pt apparently threatened to sign out AMA   Plan / Recommendations   Stroke workup - Await MRI / MRA (pt refused), repeat Head CT.  If patient cannot tolerate MRI can repeat CT  Will reorder carotid dopplers since pt refuses MRA neck.  Home Health physical therapy recommended.  If carotid dopplers unremarkable will sign off, patient can be discharged on ASA   Hospital day # 2  Personally examined patient and images, and have participated in and made any corrections needed to history, physical, neuro exam,assessment and plan as stated above.  I have personally obtained the history, evaluated lab date, reviewed imaging studies and agree with radiology interpretations.     To contact Stroke Continuity provider, please refer to [email or web address]. After hours, contact General Neurology

## 2017-07-27 NOTE — Progress Notes (Signed)
Patient ID: Sherri [name], female   DOB: 08-17-1955, 62 y.o.   MRN: [id number]  PROGRESS NOTE    Sherri [name]  WUJ:[id number] DOB: 06/10/1955 DOA: 07/25/2017 PCP: [facility]   Outpatient Specialists:    Brief Narrative:  Sherri [name]  is a 62 y.o. female, w Bipolar, Schizophrenis, hypertension, glucose intolerance, apparently c/o generalized weakness who was admitted with sudden onset of right sided weakness. Patient is awake, alert, and appears to have insight into her condition.. She appers to have delirium. Normal CT head. Patient refusing MRI. Neurology Consulted.    Assessment & Plan:   Principal Problem:   Right sided weakness Active Problems:   UTI (urinary tract infection)   Abnormal liver function   TIA (transient ischemic attack)   #1. Right Sided Weakness: patient has possible CVA/TIA. Not able to get up on her feet today.  Get repeat CT head without contrast as well as physical therapy evaluation  #2 UTI: Continue Rocephin while waiting for Culture and Sensitivity.  #3 DM2: Controlled. Continue sliding scale insulin  #4 Schizophrenia: Continue Trazodone and Haldol  #5 Abnormal LFTs: Liver tests pending.   DVT prophylaxis: Lovenox Code Status: Full Family Communication: None available Disposition Plan: ALF Consultants:   Neurology   Procedures: None Antimicrobials: Rocephin  Subjective: Patient has complain of weakness in her left and right legs this morning.  Not able to stand on her feet and mobilize  Objective: Vitals:   07/27/17 0044 07/27/17 0439 07/27/17 0841 07/27/17 1153  BP: 135/62 132/77 (!) 145/72 (!) 150/51  Pulse: 91 100 89 87  Resp: 16 16 16 16   Temp: 97.8 F (36.6 C) 98 F (36.7 C) 98.5 F (36.9 C) 98.5 F (36.9 C)  TempSrc: Oral Axillary Oral Axillary  SpO2: 96% 98% 96% 94%  Weight:      Height:        Intake/Output Summary (Last 24 hours) at 07/27/2017 1427 Last data filed at 07/27/2017 0900 Gross  per 24 hour  Intake 340 ml  Output 1100 ml  Net -760 ml   Filed Weights   07/25/17 1915 07/25/17 2026  Weight: 60.3 kg (133 lb) 60.3 kg (133 lb)    Examination:  General exam: Appears calm and comfortable  Respiratory system: Clear to auscultation. Respiratory effort normal. Cardiovascular system: S1 & S2 heard, RRR. No JVD, murmurs, rubs, gallops or clicks. No pedal edema. Gastrointestinal system: Abdomen is nondistended, soft and nontender. No organomegaly or masses felt. Normal bowel sounds heard. Central nervous system: Alert and oriented. No focal neurological deficits. Extremities: Symmetric 5 x 5 power. Skin: No rashes, lesions or ulcers Psychiatry: Judgement and insight appear normal. Mood & affect appropriate.     Data Reviewed: I have personally reviewed following labs and imaging studies  CBC: Recent Labs  Lab 07/25/17 1059 07/25/17 1107  WBC 13.8*  --   NEUTROABS 11.2*  --   HGB 15.1* 15.6*  HCT 45.1 46.0  MCV 89.0  --   PLT 286  --    Basic Metabolic Panel: Recent Labs  Lab 07/25/17 1059 07/25/17 1107  NA 138 137  K 3.6 5.1  CL 103 104  CO2 20*  --   GLUCOSE 141* 144*  BUN 28* 43*  CREATININE 0.94 0.80  CALCIUM 10.5*  --    GFR: Estimated Creatinine Clearance: 62.4 mL/min (by C-G formula based on SCr of 0.8 mg/dL). Liver Function Tests: Recent Labs  Lab 07/25/17 1059  AST 92*  ALT 65*  ALKPHOS 77  BILITOT 1.2  PROT 7.7  ALBUMIN 4.6   No results for input(s): LIPASE, AMYLASE in the last 168 hours. No results for input(s): AMMONIA in the last 168 hours. Coagulation Profile: Recent Labs  Lab 07/25/17 1059  INR 0.97   Cardiac Enzymes: No results for input(s): CKTOTAL, CKMB, CKMBINDEX, TROPONINI in the last 168 hours. BNP (last 3 results) No results for input(s): PROBNP in the last 8760 hours. HbA1C: Recent Labs    07/26/17 0338  HGBA1C 6.4*   CBG: Recent Labs  Lab 07/25/17 1906 07/26/17 1852 07/26/17 2127 07/27/17 0607  07/27/17 1150  GLUCAP 128* 114* 106* 109* 107*   Lipid Profile: Recent Labs    07/26/17 0339  CHOL 197  HDL 49  LDLCALC 122*  TRIG 130  CHOLHDL 4.0   Thyroid Function Tests: No results for input(s): TSH, T4TOTAL, FREET4, T3FREE, THYROIDAB in the last 72 hours. Anemia Panel: No results for input(s): VITAMINB12, FOLATE, FERRITIN, TIBC, IRON, RETICCTPCT in the last 72 hours. Urine analysis:    Component Value Date/Time   COLORURINE YELLOW 07/25/2017 1335   APPEARANCEUR CLOUDY (A) 07/25/2017 1335   LABSPEC 1.017 07/25/2017 1335   PHURINE 5.0 07/25/2017 1335   GLUCOSEU NEGATIVE 07/25/2017 1335   HGBUR SMALL (A) 07/25/2017 1335   BILIRUBINUR NEGATIVE 07/25/2017 1335   KETONESUR 20 (A) 07/25/2017 1335   PROTEINUR 30 (A) 07/25/2017 1335   NITRITE NEGATIVE 07/25/2017 1335   LEUKOCYTESUR NEGATIVE 07/25/2017 1335   Sepsis Labs: @LABRCNTIP (procalcitonin:4,lacticidven:4)  )No results found for this or any previous visit (from the past 240 hour(s)).       Radiology Studies: Ct Head Wo Contrast  Result Date: 07/27/2017 CLINICAL DATA:  Stroke follow-up EXAM: CT HEAD WITHOUT CONTRAST TECHNIQUE: Contiguous axial images were obtained from the base of the skull through the vertex without intravenous contrast. COMPARISON:  Two days ago.  Right-sided weakness FINDINGS: Brain: No evidence of infarction, hemorrhage, hydrocephalus, extra-axial collection or mass lesion/mass effect. Vascular: No hyperdense vessel or unexpected calcification. Skull: Normal. Negative for fracture or focal lesion. Sinuses/Orbits: No acute finding. IMPRESSION: Stable, negative head CT. Electronically Signed   By: Marnee SpringJonathon  Watts M.D.   On: 07/27/2017 12:07   [place] Abdomen Limited Ruq  Result Date: 07/25/2017 CLINICAL DATA:  Increased LFT EXAM: ULTRASOUND ABDOMEN LIMITED RIGHT UPPER QUADRANT COMPARISON:  None. FINDINGS: Gallbladder: No gallstones or wall thickening visualized. No sonographic Murphy sign noted by  sonographer. Common bile duct: Diameter: 4.9 mm Liver: Increased echogenicity. No focal hepatic abnormality. Portal vein is patent on color Doppler imaging with normal direction of blood flow towards the liver. Incidental note made of cysts in the mid right kidney measuring up to 2.2 cm. IMPRESSION: 1. Negative for gallstones or biliary dilatation 2. Increased hepatic echogenicity consistent with steatosis and or hepatocellular disease. Electronically Signed   By: Jasmine PangKim  Fujinaga M.D.   On: 07/25/2017 23:40        Scheduled Meds: .  stroke: mapping our early stages of recovery book   Does not apply Once  . aspirin  300 mg Rectal Daily   Or  . aspirin  325 mg Oral Daily  . atorvastatin  40 mg Oral q1800  . haloperidol  10 mg Oral QHS  . haloperidol  5 mg Oral BH-q7a  . hydrOXYzine  25 mg Oral Q8H  . insulin aspart  0-5 Units Subcutaneous QHS  . insulin aspart  0-9 Units Subcutaneous TID WC  . losartan  100 mg Oral Daily  .  metFORMIN  500 mg Oral BID WC   Continuous Infusions: . cefTRIAXone (ROCEPHIN)  IV Stopped (07/26/17 2302)     LOS: 2 days    Time spent: 33 minutes    Jaymes Hang,LAWAL, MD [facility] Pager [phone]  If 7PM-7AM, please contact night-coverage [email or web address] Password [facility] 07/27/2017, 2:27 PM

## 2017-07-27 NOTE — Progress Notes (Signed)
3/31 @ 10:14am. Pt refusing Mri after being asked by RN Nicole Cellaorothy.

## 2017-07-27 NOTE — Evaluation (Signed)
Speech Language Pathology Evaluation Patient Details Name: Sherri [name] MRN: [id number] DOB: Mar 25, 1956 Today's Date: 07/27/2017 Time: [id number] SLP Time Calculation (min) (ACUTE ONLY): 25 min  Problem List:  Patient Active Problem List   Diagnosis Date Noted  . TIA (transient ischemic attack) 07/26/2017  . Right sided weakness 07/25/2017  . UTI (urinary tract infection) 07/25/2017  . Abnormal liver function 07/25/2017  . Schizophrenia, disorganized, chronic (HCC) 06/03/2017  . Schizoaffective disorder, bipolar type (HCC) 05/31/2017  . Psychosis in elderly, with behavioral disturbance 04/12/2017   Past Medical History:  Past Medical History:  Diagnosis Date  . Bipolar 1 disorder (HCC)   . Hypertension   . Prediabetes   . Schizophrenia (HCC)   . Tachycardia    Past Surgical History:  Past Surgical History:  Procedure Laterality Date  . COLON SURGERY     HPI:  62 year old female admitted 07/25/17 with UTI and generalized weakness. PMH significant for BiPolar, Schizophrenia, HTN, glucose intolerance. CT negative for acute abnormality. Pt refusing MRI, stating "I tried that yesterday and they almost killed me"   Assessment / Plan / Recommendation Clinical Impression  The Mini-Mental State Exam (MMSE) was administered. Pt scored 24/30 (n=26+/30), which indicates mild cognitive impairment for pt age and level of education (college graduate). No family is present at this time to discuss baseline level of cognitive function, however pt has a history of schizophrenia, of which cognitive dysfunction is a core feature. No further ST intervention is recommended at this level of care. Recomend follow up at next venue if difficulties arise.     SLP Assessment  SLP Recommendation/Assessment: All further Speech Language Pathology needs can be addressed in the next venue of care if needs arise.  SLP Visit Diagnosis: Cognitive communication deficit (R41.841)    Follow Up  Recommendations  24 hour supervision/assistance       SLP Evaluation Cognition  Overall Cognitive Status: No family/caregiver present to determine baseline cognitive functioning Arousal/Alertness: Awake/alert Orientation Level: Oriented to person;Oriented to place;Oriented to situation Memory: Impaired Memory Impairment: Storage deficit;Retrieval deficit;Decreased recall of new information;Decreased short term memory Behaviors: (flat affect, poor eye contact)       Comprehension  Auditory Comprehension Overall Auditory Comprehension: Appears within functional limits for tasks assessed    Expression Expression Primary Mode of Expression: Verbal Verbal Expression Overall Verbal Expression: Appears within functional limits for tasks assessed   Oral / Motor  Oral Motor/Sensory Function Overall Oral Motor/Sensory Function: Within functional limits Motor Speech Overall Motor Speech: Appears within functional limits for tasks assessed   GO          Kamren Heskett B. Murvin NatalBueche, [facility] Area Memorial HospitalMSP, CCC-SLP Speech Language Pathologist [phone]  Leigh AuroraBueche, Raney Antwine Brown 07/27/2017, 10:25 AM

## 2017-07-27 NOTE — Evaluation (Addendum)
Occupational Therapy Evaluation Patient Details Name: Sherri [name] MRN: [id number] DOB: August 25, 1955 Today's Date: 07/27/2017    History of Present Illness Sherri [name]  is a 61 y.o. female, w Bipolar, Schizophrenis, hypertension, glucose intolerance, apparently c/o right sided weakness and inability to walk. CT negative   Clinical Impression   PTA, pt was living at [facility] and performing ADLs, light IADLs, and driving until last week when pt reports weakness. Pt currently demonstrating decreased occupational performance and participation. Pt requiring Mod-Max A for bed mobility and Max A for dressing/bathing. Pt with poor initiation and requiring Max cues and significant amount of time. Pt able to maintain sitting at EOB for UE AAROM exercises. Pt's UE strength and ROM inconsistent during self feeding where pt was able to reach forward and bring cup to mouth. Pt stating "I just want to die because I can't do anything." Pt would benefit from further acute OT to facilitate safe dc. Pending pt's progress, recommend dc to ALF with HHOT for further OT to optimize safety, independence with ADLs, and return to PLOF.      Follow Up Recommendations  Home health OT;Other (comment)(ALF) . Pending pt progress, may need SNF level care.   Equipment Recommendations  None recommended by OT    Recommendations for Other Services PT consult     Precautions / Restrictions Precautions Precautions: Fall Restrictions Weight Bearing Restrictions: No      Mobility Bed Mobility Overal bed mobility: Needs Assistance Bed Mobility: Supine to Sit;Sit to Supine     Supine to sit: Max assist;HOB elevated Sit to supine: Mod assist   General bed mobility comments: Max A to bring BLEs to EOB and elevate trunk. Pt with poor initation. Mod A for returning to supine with assistance to bring BLEs over EOB  Transfers Overall transfer level: Needs assistance               General transfer comment: Pt  stating she is unable to stand    Balance Overall balance assessment: Needs assistance Sitting-balance support: No upper extremity supported Sitting balance-Leahy Scale: Fair                                     ADL either performed or assessed with clinical judgement   ADL Overall ADL's : Needs assistance/impaired Eating/Feeding: Set up;Bed level Eating/Feeding Details (indicate cue type and reason): Able to reach forward and pick up cup and bring to mouth to drink water Grooming: Min guard;Sitting Grooming Details (indicate cue type and reason): Pt able to maintain sitting at EOB.  Upper Body Bathing: Maximal assistance;Bed level   Lower Body Bathing: Maximal assistance;Bed level   Upper Body Dressing : Maximal assistance;Bed level   Lower Body Dressing: Maximal assistance;Bed level                 General ADL Comments: Pt requiring Max A for bed mobility and dressing/bathing. Pt requiring Max cues and increased time for participating and following commands. Pt continue to repeat "I can't move my arms." "I can't do anything."     Vision Baseline Vision/History: Wears glasses Wears Glasses: Reading only Patient Visual Report: No change from baseline       Perception     Praxis      Pertinent Vitals/Pain Pain Assessment: No/denies pain     Hand Dominance Right   Extremity/Trunk Assessment Upper Extremity Assessment Upper Extremity Assessment: Generalized weakness  Lower Extremity Assessment Lower Extremity Assessment: Defer to PT evaluation   Cervical / Trunk Assessment Cervical / Trunk Assessment: Kyphotic   Communication Communication Communication: Other (comment)(slow)   Cognition Arousal/Alertness: Awake/alert Behavior During Therapy: WFL for tasks assessed/performed Overall Cognitive Status: Impaired/Different from baseline Area of Impairment: Following commands;Problem solving;Awareness;Attention                    Current Attention Level: Sustained   Following Commands: Follows one step commands with increased time;Follows multi-step commands with increased time   Awareness: Emergent Problem Solving: Slow processing;Requires verbal cues;Requires tactile cues;Decreased initiation General Comments: Pt with cognitive deficits at baseline. Pt oriented and following cues to increased time and cuing. Pt speech slow and deliberate. Pt statign she is unable to move any extermity and requiring tactile cues for mobility and excericses. However, pt asking for drink of water and was able to reach out and pick up cup and bring to mouth. Pt also stating "I want to die because I can't do anything."   General Comments       Exercises Exercises: General Upper Extremity;General Lower Extremity General Exercises - Upper Extremity Shoulder Flexion: AAROM;Both;5 reps;Seated General Exercises - Lower Extremity Heel Slides: AAROM;Both;5 reps;Supine   Shoulder Instructions      Home Living Family/patient expects to be discharged to:: Assisted living     Type of Home: Skilled Nursing Facility                       Home Equipment: None   Additional Comments: pt has lived at [facility] per her reports      Prior Functioning/Environment Level of Independence: Needs assistance  Gait / Transfers Assistance Needed: she reports that she ambulated without AD (unclear of her reliability) ADL's / Homemaking Assistance Needed: she reports that she just showered on her own last week but has fallen several times and has been needing some assist with ADL's   Comments: Reports she was driving till recently        OT Problem List: Decreased strength;Decreased range of motion;Decreased activity tolerance;Impaired balance (sitting and/or standing);Decreased cognition;Decreased safety awareness;Decreased knowledge of use of DME or AE;Decreased knowledge of precautions      OT Treatment/Interventions:  Self-care/ADL training;Therapeutic exercise;Energy conservation;DME and/or AE instruction;Therapeutic activities;Patient/family education    OT Goals(Current goals can be found in the care plan section) Acute Rehab OT Goals Patient Stated Goal: figure out what is wrong with her OT Goal Formulation: With patient Time For Goal Achievement: 08/10/17 Potential to Achieve Goals: Good ADL Goals Pt Will Perform Grooming: (P) with set-up;with supervision;sitting Pt Will Perform Upper Body Dressing: (P) with set-up;with supervision;sitting Pt Will Transfer to Toilet: (P) with mod assist;bedside commode;stand pivot transfer Pt Will Perform Toileting - Clothing Manipulation and hygiene: (P) with min assist;sit to/from stand Additional ADL Goal #1: (P) Pt will perform bed mobility with Min A in preparation for ADLs  OT Frequency: Min 3X/week   Barriers to D/C:            Co-evaluation              AM-PAC PT "6 Clicks" Daily Activity     Outcome Measure Help from another person eating meals?: None Help from another person taking care of personal grooming?: A Little Help from another person toileting, which includes using toliet, bedpan, or urinal?: A Lot Help from another person bathing (including washing, rinsing, drying)?: A Lot Help from another person to put  on and taking off regular upper body clothing?: A Lot Help from another person to put on and taking off regular lower body clothing?: A Lot 6 Click Score: 15   End of Session Nurse Communication: Mobility status  Activity Tolerance: Patient limited by fatigue Patient left: in bed;with call bell/phone within reach;with bed alarm set  OT Visit Diagnosis: Unsteadiness on feet (R26.81);Other abnormalities of gait and mobility (R26.89);Muscle weakness (generalized) (M62.81);Other symptoms and signs involving cognitive function                Time: 1610-9604 OT Time Calculation (min): 25 min Charges:  OT General Charges $OT Visit:  1 Visit OT Evaluation $OT Eval Moderate Complexity: 1 Mod OT Treatments $Self Care/Home Management : 8-22 mins G-Codes:     Kailoni Vahle MSOT, OTR/L Acute Rehab Pager: [phone] Office: [phone]  Theodoro Grist Ashleigh Luckow 07/27/2017, 4:25 PM

## 2017-07-28 ENCOUNTER — Inpatient Hospital Stay (HOSPITAL_COMMUNITY): Payer: Non-veteran care

## 2017-07-28 DIAGNOSIS — F061 Catatonic disorder due to known physiological condition: Secondary | ICD-10-CM | POA: Diagnosis not present

## 2017-07-28 DIAGNOSIS — G4700 Insomnia, unspecified: Secondary | ICD-10-CM

## 2017-07-28 DIAGNOSIS — F4321 Adjustment disorder with depressed mood: Secondary | ICD-10-CM

## 2017-07-28 DIAGNOSIS — R531 Weakness: Secondary | ICD-10-CM | POA: Diagnosis not present

## 2017-07-28 DIAGNOSIS — F419 Anxiety disorder, unspecified: Secondary | ICD-10-CM

## 2017-07-28 LAB — BASIC METABOLIC PANEL
ANION GAP: 12 (ref 5–15)
BUN: 21 mg/dL — ABNORMAL HIGH (ref 6–20)
CALCIUM: 9.2 mg/dL (ref 8.9–10.3)
CO2: 23 mmol/L (ref 22–32)
Chloride: 102 mmol/L (ref 101–111)
Creatinine, Ser: 0.7 mg/dL (ref 0.44–1.00)
GLUCOSE: 127 mg/dL — AB (ref 65–99)
POTASSIUM: 3.6 mmol/L (ref 3.5–5.1)
SODIUM: 137 mmol/L (ref 135–145)

## 2017-07-28 LAB — GLUCOSE, CAPILLARY
GLUCOSE-CAPILLARY: 116 mg/dL — AB (ref 65–99)
GLUCOSE-CAPILLARY: 154 mg/dL — AB (ref 65–99)
GLUCOSE-CAPILLARY: 117 mg/dL — AB (ref 65–99)
Glucose-Capillary: 117 mg/dL — ABNORMAL HIGH (ref 65–99)

## 2017-07-28 LAB — CBC
HCT: 44.3 % (ref 36.0–46.0)
Hemoglobin: 14.6 g/dL (ref 12.0–15.0)
MCH: 29.5 pg (ref 26.0–34.0)
MCHC: 33 g/dL (ref 30.0–36.0)
MCV: 89.5 fL (ref 78.0–100.0)
PLATELETS: 323 10*3/uL (ref 150–400)
RBC: 4.95 MIL/uL (ref 3.87–5.11)
RDW: 12.7 % (ref 11.5–15.5)
WBC: 14.9 10*3/uL — AB (ref 4.0–10.5)

## 2017-07-28 MED ORDER — HALOPERIDOL 1 MG PO TABS
5.0000 mg | ORAL_TABLET | Freq: Every day | ORAL | Status: DC
  Administered 2017-07-28: 5 mg via ORAL
  Filled 2017-07-28: qty 5

## 2017-07-28 NOTE — Progress Notes (Signed)
Patient ID: Sherri [name], female   DOB: 1955/12/03, 62 y.o.   MRN: [id number]  PROGRESS NOTE    Sherri LuisMichelle S Siedschlag  QMV:[id number] DOB: 1955/12/03 DOA: 07/25/2017 [facility]   Outpatient Specialists:    Brief Narrative:  Verl BlalockMichelle Virginia  is a 62 y.o. female, w Bipolar, Schizophrenis, hypertension, glucose intolerance, apparently c/o generalized weakness who was admitted with sudden onset of right sided weakness. Patient is awake, alert, and appears to have insight into her condition.. She appers to have delirium. Normal CT head. Patient refusing MRI. Repeat CT scan showed no CVA. Neurology following.  Assessment & Plan:   Principal Problem:   Right sided weakness Active Problems:   UTI (urinary tract infection)   Abnormal liver function   TIA (transient ischemic attack)   #1. Right Sided Weakness: Patient has had workup so far showing no evidence of CVA.  It appears this is all psych.  Patient is saying she cannot walk but able to move all her limbs.  With or tissue is able to feed herself.  She is currently in the chair.  PT evaluate and patient also.  If this is persistent [facility].  Carotid Dopplers also normal.  #2 Possible UTI: Will DC Rocephin for negative Culture and Sensitivity.  #3 DM2: Controlled. Continue sliding scale insulin with Metformin.  #4 Schizophrenia: Patient's condition seemed to be related to high schizophrenia and bipolar disorder.  We will consult psychiatric and consider decreasing the dose of the Haldol.  She is on Vistaril with trazodone and Haldol.  #5 Abnormal LFTs: Probably medication related.  Much improved now..   DVT prophylaxis: Lovenox Code Status: Full Family Communication: None available Disposition Plan: ALF Consultants:   Neurology   Procedures: None Antimicrobials: Rocephin  Subjective: Patient has complain of weakness in her left and right legs this morning.  Not  able to stand on her feet and mobilize  Objective: Vitals:   07/28/17 0006 07/28/17 0454 07/28/17 0951 07/28/17 1200  BP: (!) 173/76 (!) 152/66 (!) 151/76 (!) 146/77  Pulse: (!) 106 86 (!) 105 (!) 105  Resp: 16 16 20 16   Temp: 98.2 F (36.8 C) 98.1 F (36.7 C) 98.6 F (37 C) 98.3 F (36.8 C)  TempSrc: Oral Oral Oral Oral  SpO2: 95% 98% 97% 97%  Weight:      Height:        Intake/Output Summary (Last 24 hours) at 07/28/2017 1505 Last data filed at 07/28/2017 1300 Gross per 24 hour  Intake 700 ml  Output 650 ml  Net 50 ml   Filed Weights   07/25/17 1915 07/25/17 2026  Weight: 60.3 kg (133 lb) 60.3 kg (133 lb)    Examination:  General exam: Appears calm and comfortable  Respiratory system: Clear to auscultation. Respiratory effort normal. Cardiovascular system: S1 & S2 heard, RRR. No JVD, murmurs, rubs, gallops or clicks. No pedal edema. Gastrointestinal system: Abdomen is nondistended, soft and nontender. No organomegaly or masses felt. Normal bowel sounds heard. Central nervous system: Alert and oriented. No focal neurological deficits. Extremities: Symmetric 5 x 5 power. Skin: No rashes, lesions or ulcers Psychiatry: Judgement and insight appear normal. Mood & affect appropriate.     Data Reviewed: I have personally reviewed following labs and imaging studies  CBC: Recent Labs  Lab 07/25/17 1059 07/25/17 1107 07/28/17 0724  WBC 13.8*  --  14.9*  NEUTROABS 11.2*  --   --   HGB  15.1* 15.6* 14.6  HCT 45.1 46.0 44.3  MCV 89.0  --  89.5  PLT 286  --  323   Basic Metabolic Panel: Recent Labs  Lab 07/25/17 1059 07/25/17 1107 07/28/17 0724  NA 138 137 137  K 3.6 5.1 3.6  CL 103 104 102  CO2 20*  --  23  GLUCOSE 141* 144* 127*  BUN 28* 43* 21*  CREATININE 0.94 0.80 0.70  CALCIUM 10.5*  --  9.2   GFR: Estimated Creatinine Clearance: 62.4 mL/min (by C-G formula based on SCr of 0.7 mg/dL). Liver Function Tests: Recent Labs  Lab 07/25/17 1059  AST 92*    ALT 65*  ALKPHOS 77  BILITOT 1.2  PROT 7.7  ALBUMIN 4.6   No results for input(s): LIPASE, AMYLASE in the last 168 hours. No results for input(s): AMMONIA in the last 168 hours. Coagulation Profile: Recent Labs  Lab 07/25/17 1059  INR 0.97   Cardiac Enzymes: No results for input(s): CKTOTAL, CKMB, CKMBINDEX, TROPONINI in the last 168 hours. BNP (last 3 results) No results for input(s): PROBNP in the last 8760 hours. HbA1C: Recent Labs    07/26/17 0338  HGBA1C 6.4*   CBG: Recent Labs  Lab 07/27/17 1150 07/27/17 1628 07/27/17 2144 07/28/17 0608 07/28/17 1108  GLUCAP 107* 109* 122* 116* 154*   Lipid Profile: Recent Labs    07/26/17 0339  CHOL 197  HDL 49  LDLCALC 122*  TRIG 130  CHOLHDL 4.0   Thyroid Function Tests: No results for input(s): TSH, T4TOTAL, FREET4, T3FREE, THYROIDAB in the last 72 hours. Anemia Panel: No results for input(s): VITAMINB12, FOLATE, FERRITIN, TIBC, IRON, RETICCTPCT in the last 72 hours. Urine analysis:    Component Value Date/Time   COLORURINE YELLOW 07/25/2017 1335   APPEARANCEUR CLOUDY (A) 07/25/2017 1335   LABSPEC 1.017 07/25/2017 1335   PHURINE 5.0 07/25/2017 1335   GLUCOSEU NEGATIVE 07/25/2017 1335   HGBUR SMALL (A) 07/25/2017 1335   BILIRUBINUR NEGATIVE 07/25/2017 1335   KETONESUR 20 (A) 07/25/2017 1335   PROTEINUR 30 (A) 07/25/2017 1335   NITRITE NEGATIVE 07/25/2017 1335   LEUKOCYTESUR NEGATIVE 07/25/2017 1335   Sepsis Labs: @LABRCNTIP (procalcitonin:4,lacticidven:4)  )No results found for this or any previous visit (from the past 240 hour(s)).       Radiology Studies: Ct Head Wo Contrast  Result Date: 07/27/2017 CLINICAL DATA:  Stroke follow-up EXAM: CT HEAD WITHOUT CONTRAST TECHNIQUE: Contiguous axial images were obtained from the base of the skull through the vertex without intravenous contrast. COMPARISON:  Two days ago.  Right-sided weakness FINDINGS: Brain: No evidence of infarction, hemorrhage,  hydrocephalus, extra-axial collection or mass lesion/mass effect. Vascular: No hyperdense vessel or unexpected calcification. Skull: Normal. Negative for fracture or focal lesion. Sinuses/Orbits: No acute finding. IMPRESSION: Stable, negative head CT. Electronically Signed   By: Marnee Spring M.D.   On: 07/27/2017 12:07        Scheduled Meds: .  stroke: mapping our early stages of recovery book   Does not apply Once  . aspirin  300 mg Rectal Daily   Or  . aspirin  325 mg Oral Daily  . atorvastatin  40 mg Oral q1800  . haloperidol  5 mg Oral BH-q7a  . haloperidol  5 mg Oral QHS  . hydrOXYzine  25 mg Oral Q8H  . insulin aspart  0-5 Units Subcutaneous QHS  . insulin aspart  0-9 Units Subcutaneous TID WC  . losartan  100 mg Oral Daily  . metFORMIN  500  mg Oral BID WC   Continuous Infusions:    LOS: 3 days    Time spent: 33 minutes    GARBA,LAWAL, MD [facility] Pager [phone]  If 7PM-7AM, please contact night-coverage [email or web address] Password [email or web address] 07/28/2017, 3:05 PM

## 2017-07-28 NOTE — Progress Notes (Signed)
Physical Therapy Treatment Patient Details Name: Sherri [name] MRN: [id number] DOB: [birth year] Today's Date: 07/28/2017    History of Present Illness Sherri [name]  is a 62 y.o. female, w Bipolar, Schizophrenis, hypertension, glucose intolerance, apparently c/o right sided weakness and inability to walk. CT negative    PT Comments    Patient doesn't appear to be progressing significantly. Continues to require max assist for bed mobility and mod assist for sit to stands. Stand pivot not safe to attempt at this time due to patient heavy posterior lean that she has difficulty correcting with verbal or tactile cueing. Utilized Corene CorneaSara Stedy for transfer from bed to chair. Patient demonstrates poor initiation of motor tasks and has slow response times. Continue to recommend discharge to ALF and follow up PT if they can take her at her current level.    Follow Up Recommendations  Supervision for mobility/OOB;Home health PT     Equipment Recommendations  Other (comment)(TBD)    Recommendations for Other Services       Precautions / Restrictions Precautions Precautions: Fall Restrictions Weight Bearing Restrictions: No    Mobility  Bed Mobility Overal bed mobility: Needs Assistance Bed Mobility: Supine to Sit     Supine to sit: HOB elevated;Max assist     General bed mobility comments: Max A to bring BLEs to EOB and elevate trunk and scoot hips to EOB. Pt with poor initation.  Transfers Overall transfer level: Needs assistance Equipment used: 1 person hand held assist Transfers: Sit to/from Stand Sit to Stand: Mod assist         General transfer comment: Patient requiring mod assist to stand with one person HHA. Demonstrating heavy posterior lean in standing and not safe to initiate pivoting to chair. Not responding to verbal or tactile cueing for upright posture. Then utilized Corene CorneaSara Stedy to transfer from bed to chair; patient requiring min-mod assist to power up to stand  to [place]. Able to maintain during pericare.   Ambulation/Gait                 Stairs            Wheelchair Mobility    Modified Rankin (Stroke Patients Only)       Balance Overall balance assessment: Needs assistance Sitting-balance support: No upper extremity supported Sitting balance-Leahy Scale: Fair   Postural control: Posterior lean Standing balance support: No upper extremity supported;During functional activity Standing balance-Leahy Scale: Poor                              Cognition Arousal/Alertness: Awake/alert Behavior During Therapy: WFL for tasks assessed/performed Overall Cognitive Status: Impaired/Different from baseline Area of Impairment: Following commands;Problem solving;Awareness;Attention                   Current Attention Level: Sustained   Following Commands: Follows one step commands with increased time;Follows multi-step commands with increased time   Awareness: Emergent Problem Solving: Slow processing;Requires verbal cues;Requires tactile cues;Decreased initiation General Comments: Pt with cognitive deficits at baseline. Pt oriented to self and place, unable to state date; follows cues to increased time. Pt speech slow and deliberate. Pt stating she is unable to walk, "I can't do this."      Exercises General Exercises - Lower Extremity Heel Slides: AAROM;5 reps;Supine;Right Other Exercises Other Exercises: Supine R hip abduction x 5    General Comments        Pertinent Vitals/Pain Pain  Assessment: No/denies pain    Home Living                      Prior Function            PT Goals (current goals can now be found in the care plan section) Acute Rehab PT Goals Patient Stated Goal: figure out what is wrong with her PT Goal Formulation: With patient Time For Goal Achievement: 08/09/17 Potential to Achieve Goals: Good Progress towards PT goals: Progressing toward goals     Frequency    Min 3X/week      PT Plan Current plan remains appropriate    Co-evaluation              AM-PAC PT "6 Clicks" Daily Activity  Outcome Measure  Difficulty turning over in bed (including adjusting bedclothes, sheets and blankets)?: Unable Difficulty moving from lying on back to sitting on the side of the bed? : Unable Difficulty sitting down on and standing up from a chair with arms (e.g., wheelchair, bedside commode, etc,.)?: Unable Help needed moving to and from a bed to chair (including a wheelchair)?: A Lot Help needed walking in hospital room?: Total Help needed climbing 3-5 steps with a railing? : Total 6 Click Score: 7    End of Session Equipment Utilized During Treatment: Gait belt Activity Tolerance: Patient limited by fatigue Patient left: with call bell/phone within reach;in chair;with chair alarm set Nurse Communication: Mobility status PT Visit Diagnosis: Unsteadiness on feet (R26.81);Repeated falls (R29.6);Muscle weakness (generalized) (M62.81);Difficulty in walking, not elsewhere classified (R26.2);Hemiplegia and hemiparesis Hemiplegia - Right/Left: Right Hemiplegia - dominant/non-dominant: Dominant Hemiplegia - caused by: Unspecified     Time: 1610-9604 PT Time Calculation (min) (ACUTE ONLY): 33 min  Charges:  $Therapeutic Activity: 23-37 mins                    G Codes:       Laurina Bustle, PT, DPT Acute Rehabilitation Services  Pager: [phone]   Vanetta Mulders 07/28/2017, 12:54 PM

## 2017-07-28 NOTE — Progress Notes (Signed)
Tech's preliminary results - Carotid Duplex Completed. 1-39% stenosis in bilateral carotid arteries. Sherri [name], BS, RDMS, RVT

## 2017-07-28 NOTE — Plan of Care (Signed)
  Problem: Safety: Goal: Ability to remain free from injury will improve Outcome: Progressing   

## 2017-07-28 NOTE — Consult Note (Addendum)
[facility] Consult   Reason for Consult:  Severe depression Referring Physician:  Dr. Jonelle Sidle Patient Identification: Sherri [name] MRN:  [id number] Principal Diagnosis: Adjustment disorder with depressed mood Diagnosis:   Patient Active Problem List   Diagnosis Date Noted  . TIA (transient ischemic attack) [G45.9] 07/26/2017  . Right sided weakness [R53.1] 07/25/2017  . UTI (urinary tract infection) [N39.0] 07/25/2017  . Abnormal liver function [R94.5] 07/25/2017  . 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 Schizoaffective disorder, bipolar type ([place]) [F25.0] 05/31/2017  . Psychosis in elderly, with behavioral disturbance [F03.91] 04/12/2017    Total Time spent with patient: 1 hour  Subjective:   Sherri [name] is a 62 y.o. female patient admitted with right sided weakness.  HPI:   Per chart review, patient has a history of bipolar disorder and schizophrenia. She was admitted with sudden onset of right sided weakness and there is a question of CVA/TIA. She is receiving treatment for UTI as well.   Of note, she was hospitalized at [facility] from 2/5-3/20 for worsening psychosis and paranoia. 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 She has been living in an ALF due to inability to care for self secondary to cognitive deficits. She required forced medications due to refusing oral medications. She was switched from Zyprexa to Haldol due to ineffectiveness of Zyprexa and availability of Haldol as a long acting injectable. She was discharged on Haldol 5 mg daily and 10 mg qhs, Atarax 25 mg q 8 hours PRN and Trazodone 50 mg qhs PRN. She received Haldol Decanoate 100 mg on 3/13. She was scheduled followed up at the [facility].   On interview, Sherri [name] reports feeling down about her current medical condition.  She has not been able to walk.  She denies a history of  prior depression.  Per chart review, her affect appeared to be constricted throughout her recent hospitalization at [facility].  She denies SI, HI or AVH.  She denies problems with sleep or appetite.  She reports previously being independent of her ADLs.  Past Psychiatric History: Schizophrenia and bipolar 1 disorder.   Risk to Self: Is patient at risk for suicide?: No Risk to Others:  None.  Denies HI. Prior Inpatient Therapy:  She was hospitalized from 2/5-3/20 for worsening psychosis and paranoia.  Prior Outpatient Therapy:  She is followed at the [facility].  Past Medical History:  Past Medical History:  Diagnosis Date  . Bipolar 1 disorder ([place])   . Hypertension   . Prediabetes   . Schizophrenia ([place])   . Tachycardia     Past Surgical History:  Procedure Laterality Date  . COLON SURGERY     Family History: History reviewed. No pertinent family history. Family Psychiatric  History: Denies Social History:  Social History   Substance and Sexual Activity  Alcohol Use No  . Frequency: Never     Social History   Substance and Sexual Activity  Drug Use No    Social History   Socioeconomic History  . Marital status: Divorced    Spouse name: Not on file  . Number of children: Not on file  . Years of education: Not on file  . Highest education level: Not on file  Occupational History  . Not on file  Social Needs  . Financial resource strain: Not on file  . Food insecurity:    Worry: Not  on file    Inability: Not on file  . Transportation needs:    Medical: Not on file    Non-medical: Not on file  Tobacco Use  . Smoking status: Never Smoker  . Smokeless tobacco: Never Used  Substance and Sexual Activity  . Alcohol use: No    Frequency: Never  . Drug use: No  . Sexual activity: Not on file  Lifestyle  . Physical activity:    Days per week: Not on file    Minutes per session: Not on file  . Stress: Not on file  Relationships  . Social connections:    Talks  on phone: Not on file    Gets together: Not on file    Attends religious service: Not on file    Active member of club or organization: Not on file    Attends meetings of clubs or organizations: Not on file    Relationship status: Not on file  Other Topics Concern  . Not on file  Social History Narrative  . Not on file   Additional Social History: She lives in an ALF x 4 years. She is unemployed. She was in [employer] for 4 years. She did office work. She denies alcohol or illicit substance use.     Allergies:  No Known Allergies  Labs:  Results for orders placed or performed during the hospital encounter of 07/25/17 (from the past 48 hour(s))  Glucose, capillary     Status: Abnormal   Collection Time: 07/26/17  6:52 PM  Result Value Ref Range   Glucose-Capillary 114 (H) 65 - 99 mg/dL   Comment 1 Notify RN    Comment 2 Document in Chart   Glucose, capillary     Status: Abnormal   Collection Time: 07/26/17  9:27 PM  Result Value Ref Range   Glucose-Capillary 106 (H) 65 - 99 mg/dL  Glucose, capillary     Status: Abnormal   Collection Time: 07/27/17  6:07 AM  Result Value Ref Range   Glucose-Capillary 109 (H) 65 - 99 mg/dL  Glucose, capillary     Status: Abnormal   Collection Time: 07/27/17 11:50 AM  Result Value Ref Range   Glucose-Capillary 107 (H) 65 - 99 mg/dL  Glucose, capillary     Status: Abnormal   Collection Time: 07/27/17  4:28 PM  Result Value Ref Range   Glucose-Capillary 109 (H) 65 - 99 mg/dL  Glucose, capillary     Status: Abnormal   Collection Time: 07/27/17  9:44 PM  Result Value Ref Range   Glucose-Capillary 122 (H) 65 - 99 mg/dL   Comment 1 Notify RN    Comment 2 Document in Chart   Glucose, capillary     Status: Abnormal   Collection Time: 07/28/17  6:08 AM  Result Value Ref Range   Glucose-Capillary 116 (H) 65 - 99 mg/dL   Comment 1 Notify RN    Comment 2 Document in Chart   CBC     Status: Abnormal   Collection Time: 07/28/17  7:24 AM  Result  Value Ref Range   WBC 14.9 (H) 4.0 - 10.5 K/uL   RBC 4.95 3.87 - 5.11 MIL/uL   Hemoglobin 14.6 12.0 - 15.0 g/dL   HCT 44.3 36.0 - 46.0 %   MCV 89.5 78.0 - 100.0 fL   MCH 29.5 26.0 - 34.0 pg   MCHC 33.0 30.0 - 36.0 g/dL   RDW 12.7 11.5 - 15.5 %   Platelets 323 150 -  400 K/uL    Comment: Performed at [facility], [place] [address], [place], [place] [address]  Basic metabolic panel     Status: Abnormal   Collection Time: 07/28/17  7:24 AM  Result Value Ref Range   Sodium 137 135 - 145 mmol/L   Potassium 3.6 3.5 - 5.1 mmol/L   Chloride 102 101 - 111 mmol/L   CO2 23 22 - 32 mmol/L   Glucose, Bld 127 (H) 65 - 99 mg/dL   BUN 21 (H) 6 - 20 mg/dL   Creatinine, Ser 0.70 0.44 - 1.00 mg/dL   Calcium 9.2 8.9 - 10.3 mg/dL   GFR calc non Af Amer >60 >60 mL/min   GFR calc Af Amer >60 >60 mL/min    Comment: (NOTE) The eGFR has been calculated using the CKD EPI equation. This calculation has not been validated in all clinical situations. eGFR's persistently <60 mL/min signify possible Chronic Kidney Disease.    Anion gap 12 5 - 15    Comment: Performed at [facility] [address], [place], [place] [address]  Glucose, capillary     Status: Abnormal   Collection Time: 07/28/17 11:08 AM  Result Value Ref Range   Glucose-Capillary 154 (H) 65 - 99 mg/dL   Comment 1 Notify RN    Comment 2 Document in Chart     Current Facility-Administered Medications  Medication Dose Route Frequency Provider Last Rate Last Dose  .  stroke: mapping our early stages of recovery book   Does not apply Once Jani Gravel, MD      . acetaminophen (TYLENOL) tablet 650 mg  650 mg Oral Q4H PRN Jani Gravel, MD       Or  . acetaminophen (TYLENOL) solution 650 mg  650 mg Per Tube Q4H PRN Jani Gravel, MD       Or  . acetaminophen (TYLENOL) suppository 650 mg  650 mg Rectal Q4H PRN Jani Gravel, MD      . aspirin suppository 300 mg  300 mg Rectal Daily Jani Gravel, MD       Or  . aspirin tablet 325 mg  325 mg Oral  Daily Jani Gravel, MD   325 mg at 07/28/17 0935  . atorvastatin (LIPITOR) tablet 40 mg  40 mg Oral q1800 Rinehuls, David L, PA-C   40 mg at 07/27/17 1700  . cefTRIAXone (ROCEPHIN) 1 g in sodium chloride 0.9 % 100 mL IVPB  1 g Intravenous Q24H Jani Gravel, MD   Stopped at 07/27/17 2134  . haloperidol (HALDOL) tablet 10 mg  10 mg Oral Loma Sousa, MD   10 mg at 07/25/17 2209  . haloperidol (HALDOL) tablet 5 mg  5 mg Oral Zara Chess, MD   5 mg at 07/28/17 1950  . hydrOXYzine (ATARAX/VISTARIL) tablet 25 mg  25 mg Oral Q8H Jani Gravel, MD   25 mg at 07/28/17 [phone]  . insulin aspart (novoLOG) injection 0-5 Units  0-5 Units Subcutaneous QHS Jani Gravel, MD      . insulin aspart (novoLOG) injection 0-9 Units  0-9 Units Subcutaneous TID WC Jani Gravel, MD   2 Units at 07/28/17 1154  . losartan (COZAAR) tablet 100 mg  100 mg Oral Daily Jani Gravel, MD   100 mg at 07/28/17 0935  . metFORMIN (GLUCOPHAGE) tablet 500 mg  500 mg Oral BID WC Jani Gravel, MD   500 mg at 07/28/17 0935  . traZODone (DESYREL) tablet 50 mg  50 mg  Oral QHS PRN Jani Gravel, MD        Musculoskeletal: Strength & Muscle Tone: decreased Gait & Station: UTA due to patient lying in bed. Patient leans: N/A  Psychiatric Specialty Exam: Physical Exam  Nursing note and vitals reviewed. Constitutional: She is oriented to person, place, and time. She appears well-developed and well-nourished.  HENT:  Head: Normocephalic and atraumatic.  Neck: Normal range of motion.  Respiratory: Effort normal.  Musculoskeletal: Normal range of motion.  Neurological: She is alert and oriented to person, place, and time.  Psychiatric: Her speech is normal and behavior is normal. Judgment and thought content normal. Her affect is blunt. Cognition and memory are normal.    Review of Systems  Cardiovascular: Negative for chest pain.  Gastrointestinal: Negative for abdominal pain, constipation, diarrhea, nausea and vomiting.  Musculoskeletal:        Muscle stiffness  Psychiatric/Behavioral: Positive for depression. Negative for hallucinations, substance abuse and suicidal ideas. The patient is not nervous/anxious and does not have insomnia.   All other systems reviewed and are negative.   Blood pressure (!) 146/77, pulse (!) 105, temperature 98.3 F (36.8 C), temperature source Oral, resp. rate 16, height '5\' 2"'$  (1.575 m), weight 60.3 kg (133 lb), SpO2 97 %.Body mass index is 24.33 kg/m.  General Appearance: Fairly Groomed, middle aged, Caucasian female, wearing a hospital gown with short, gray hair and lying in bed. NAD.   Eye Contact:  Good  Speech:  Clear and Coherent and Normal Rate  Volume:  Normal  Mood:  "Down"  Affect:  Constricted  Thought Process:  Goal Directed, Linear and Descriptions of Associations: Intact  Orientation:  Full (Time, Place, and Person)  Thought Content:  Logical  Suicidal Thoughts:  No  Homicidal Thoughts:  No  Memory:  Immediate;   Fair Recent;   Fair Remote;   Fair  Judgement:  Fair  Insight:  Fair  Psychomotor Activity:  Decreased  Concentration:  Concentration: Good and Attention Span: Good  Recall:  Good  Fund of Knowledge:  Good  Language:  Good  Akathisia:  No  Handed:  Right  AIMS (if indicated):   N/A  Assets:  Communication Skills Housing  ADL's:  Impaired  Cognition:  WNL  Sleep:   Okay   Assessment:  Sherri [name] is a 62 y.o. female who was admitted with right sided weakness and a question of CVA/TIA. She reports depressed mood due to her current medical condition for the past 2 days. She denies a prior history of depression. Her acute decline in mood appears to be secondary to an adjustment disorder. Her mood is likely to improve when her condition improves. She will likely benefit from PT. If her low mood persists for 2 weeks it would be reasonable to consider an antidepressant at this time.   Treatment Plan Summary: -Continue home Haldol 5 mg daily for psychosis. Increase  Haldol 5 mg qhs to 10 mg qhs (appears to be patient's dose at discharge from [facility] in [facility]).  -Continue Trazodone 50 mg qhs PRN for insomnia. -Continue monthly Haldol Decanoate 100 mg (last received on 07/09/17 and next due on 08/08/17). -Continue Atarax 25 mg 8 hours PRN for anxiety.  -If low mood persists for 2 weeks then it would be reasonable to consider an antidepressant but her low mood at this time is likely secondary to an adjustment disorder.  -Psychiatry will sign off on patient at this time. Please consult psychiatry again as needed.  Disposition:  No evidence of imminent risk to self or others at present.   Patient does not meet criteria for psychiatric inpatient admission.  Faythe Dingwall, DO 07/28/2017 2:37 PM

## 2017-07-28 NOTE — Progress Notes (Signed)
CM spoke to patients HCPOA, her brother Sherri [name] on the phone. He lives out of states but was recently in town visiting his sister and states she was much more mobile during that visit.  Sherri [name] is concerned about the patient being [facility] one of the [place] d/t her [facility] insurance. If the patient is going to require many more days in the hospital he wishes for her to be transferred to one of the [place] facilities. He states that Ms. Sherri [name] is familiar with [facility]. CM called [facility] and request the packet for transfer to their facility and for the packet for rehab in case patient needs this at d/c. CM spoke to WESCO InternationalKelly [name] at the [facility] and she is to email CM the requested information.  CM spoke to Sherri GrimesAmy [name] at [facility],the ALF that the patient resides, to see what assistance they can provide her at d/c. Sherri states the patient would ideally only need one person assistance for transfers to be able to return.   CM received packet from CSW [facility] needs at d/c. CM called Sherri [name] to have him fill out the financial aspects of the forms and he states he is not the financial POA. He states that Ms Sherri [name] has a Pensions consultantublic Financial Guardian in [place]: Ova FreshwaterSteven [name] [phone]. CM attempted to reach [name] without success, but left voice message for return call. [name] will need to fill out the forms for the [facility] before they can be submitted. CM following.

## 2017-07-29 DIAGNOSIS — R531 Weakness: Secondary | ICD-10-CM | POA: Diagnosis not present

## 2017-07-29 LAB — GLUCOSE, CAPILLARY
GLUCOSE-CAPILLARY: 106 mg/dL — AB (ref 65–99)
Glucose-Capillary: 131 mg/dL — ABNORMAL HIGH (ref 65–99)
Glucose-Capillary: 109 mg/dL — ABNORMAL HIGH (ref 65–99)
Glucose-Capillary: 107 mg/dL — ABNORMAL HIGH (ref 65–99)

## 2017-07-29 MED ORDER — HALOPERIDOL 5 MG PO TABS
10.0000 mg | ORAL_TABLET | Freq: Every day | ORAL | Status: DC
  Administered 2017-07-29 – 2017-08-13 (×13): 10 mg via ORAL
  Filled 2017-07-29 (×3): qty 2
  Filled 2017-07-29: qty 10
  Filled 2017-07-29: qty 2
  Filled 2017-07-29 (×2): qty 10
  Filled 2017-07-29: qty 2
  Filled 2017-07-29: qty 10
  Filled 2017-07-29: qty 2
  Filled 2017-07-29: qty 10
  Filled 2017-07-29: qty 2
  Filled 2017-07-29: qty 10
  Filled 2017-07-29 (×2): qty 2
  Filled 2017-07-29: qty 10

## 2017-07-29 NOTE — Progress Notes (Signed)
Physical Therapy Treatment Patient Details Name: Sherri [name] MRN: [id number] DOB: Jan 20, 1956 Today's Date: 07/29/2017    History of Present Illness Sherri BlalockMichelle [name]  is a 62 y.o. female, w Bipolar, Schizophrenis, hypertension, glucose intolerance, apparently c/o right sided weakness and inability to walk. CT negative    PT Comments    Patient not progressing towards goals with suspected cognitive involvement versus weakness. Patient muttering, "I can't do this," repetitively and "I can't walk," despite extensive encouragement and positive reinforcement. Continues to require max assist for bed mobility and mod assist for transfers with very poor to no initiation for any motor tasks. Trialed transfers with RW this session for balance but not able to attempt gait due to heavy posterior lean in standing. Low pivot performed with modA + 2 and HHA from bed to chair. Will continue to follow.    Follow Up Recommendations  Supervision for mobility/OOB;Home health PT     Equipment Recommendations  None recommended by PT    Recommendations for Other Services       Precautions / Restrictions Precautions Precautions: Fall Restrictions Weight Bearing Restrictions: No    Mobility  Bed Mobility Overal bed mobility: Needs Assistance Bed Mobility: Supine to Sit     Supine to sit: HOB elevated;Max assist     General bed mobility comments: Max A to bring BLEs to EOB and elevate trunk and total assist to scoot hips to EOB. Pt with no initation.  Transfers Overall transfer level: Needs assistance Equipment used: 2 person hand held assist;Rolling walker (2 wheeled) Transfers: Sit to/from [employer] CorporationStand;Squat Pivot Transfers Sit to Stand: +2 physical assistance;Mod assist   Squat pivot transfers: +2 physical assistance;Mod assist     General transfer comment: Patient requiring mod assist + 2 with x 2 stands with RW and up to max assist + 2 in standing due to heavy posterior lean and lack of hip  extension. does not respond to verbal or tactile cueing for posture. On 2nd trial of standing, attempted to place feet posterior to knees but then pt slides feet out prior to standing.   Ambulation/Gait             General Gait Details: Could not attempt. Patient with some activation when moving R foot posteriorly   Stairs            Wheelchair Mobility    Modified Rankin (Stroke Patients Only)       Balance Overall balance assessment: Needs assistance Sitting-balance support: No upper extremity supported Sitting balance-Leahy Scale: Poor Sitting balance - Comments: Begins to intermittently lean posterior with no support.  Postural control: Posterior lean Standing balance support: No upper extremity supported;During functional activity Standing balance-Leahy Scale: Poor                              Cognition Arousal/Alertness: Awake/alert Behavior During Therapy: WFL for tasks assessed/performed Overall Cognitive Status: Impaired/Different from baseline Area of Impairment: Following commands;Problem solving;Awareness;Attention                   Current Attention Level: Sustained   Following Commands: Follows one step commands with increased time;Follows multi-step commands with increased time   Awareness: Emergent Problem Solving: Slow processing;Requires verbal cues;Requires tactile cues;Decreased initiation General Comments: Pt with cognitive deficits at baseline. Pt oriented to self and place, unable to state date; follows cues to increased time. Pt speech slow and deliberate. Pt stating she is unable to  walk, "I can't do this."      Exercises      General Comments        Pertinent Vitals/Pain Pain Assessment: No/denies pain    Home Living                      Prior Function            PT Goals (current goals can now be found in the care plan section) Acute Rehab PT Goals Patient Stated Goal: figure out what is wrong  with her PT Goal Formulation: With patient Time For Goal Achievement: 08/09/17 Potential to Achieve Goals: Good Progress towards PT goals: Not progressing toward goals - comment(Secondary to mental state versus weakness)    Frequency    Min 3X/week      PT Plan Current plan remains appropriate    Co-evaluation              AM-PAC PT "6 Clicks" Daily Activity  Outcome Measure  Difficulty turning over in bed (including adjusting bedclothes, sheets and blankets)?: Unable Difficulty moving from lying on back to sitting on the side of the bed? : Unable Difficulty sitting down on and standing up from a chair with arms (e.g., wheelchair, bedside commode, etc,.)?: Unable Help needed moving to and from a bed to chair (including a wheelchair)?: A Lot Help needed walking in hospital room?: Total Help needed climbing 3-5 steps with a railing? : Total 6 Click Score: 7    End of Session Equipment Utilized During Treatment: Gait belt Activity Tolerance: Patient limited by fatigue Patient left: with call bell/phone within reach;in chair;with chair alarm set Nurse Communication: Mobility status PT Visit Diagnosis: Unsteadiness on feet (R26.81);Repeated falls (R29.6);Muscle weakness (generalized) (M62.81);Difficulty in walking, not elsewhere classified (R26.2);Hemiplegia and hemiparesis Hemiplegia - Right/Left: Right Hemiplegia - dominant/non-dominant: Dominant Hemiplegia - caused by: Unspecified     Time: 4540-9811 PT Time Calculation (min) (ACUTE ONLY): 18 min  Charges:  $Therapeutic Activity: 8-22 mins                    G Codes:       Laurina Bustle, PT, DPT Acute Rehabilitation Services  Pager: [phone]    Vanetta Mulders 07/29/2017, 12:16 PM

## 2017-07-29 NOTE — Progress Notes (Addendum)
Occupational Therapy Treatment Patient Details Name: Sherri [name] MRN: [id number] DOB: [birth year] Today's Date: 07/29/2017    History of present illness Tannisha Kennington  is a 62 y.o. female, w Bipolar, Schizophrenis, hypertension, glucose intolerance, apparently c/o right sided weakness and inability to walk. CT negative   OT comments  Pt making slow progress towards goals; minimally responsive during session and requiring multimodal cues and encouragement throughout to participate in and initiate tasks. Pt completing AAROM to bil UE/LEs for increased strengthening, completed transfer recliner to EOB with use of Stedy and ModA+2 for sit<>stand. Assisted pt with simple grooming ADLs with min hand over hand assist. Pending pt progress (and level of assist available at ALF)may need to consider ST SNF stay at time of discharge, will continue to follow acutely to progress pt towards established OT goals and to assess for updated d/c needs.    Follow Up Recommendations  Home health OT;Other (comment)(ALF)    Equipment Recommendations  None recommended by OT          Precautions / Restrictions Precautions Precautions: Fall Restrictions Weight Bearing Restrictions: No       Mobility Bed Mobility Overal bed mobility: Needs Assistance Bed Mobility: Sit to Supine     Supine to sit: Max assist;+2 for physical assistance;+2 for safety/equipment     General bed mobility comments: assist to support trunk and guide LEs onto EOB; +2 to scoot to [facility]   Transfers Overall transfer level: Needs assistance Equipment used: 2 person hand held assist;Rolling walker (2 wheeled) Transfers: Sit to/from Stand Sit to Stand: +2 physical assistance;Mod assist   Squat pivot transfers: +2 physical assistance;Mod assist     General transfer comment: Pt requires MaxA to scoot hips towards edge of chair and to initiate leaning forward to grasp Stedy, once UEs in place pt more actively initating  sit<>stand at [facility], completing with ModA+2    Balance Overall balance assessment: Needs assistance Sitting-balance support: No upper extremity supported Sitting balance-Leahy Scale: Poor Sitting balance - Comments: Begins to intermittently lean posterior with no support.  Postural control: Posterior lean Standing balance support: No upper extremity supported;During functional activity Standing balance-Leahy Scale: Poor                             ADL either performed or assessed with clinical judgement   ADL Overall ADL's : Needs assistance/impaired Eating/Feeding: Set up;Bed level Eating/Feeding Details (indicate cue type and reason): Able to reach forward, grasp cup from therapist's hand and bring to mouth to drink water Grooming: Minimal assistance;Bed level;Wash/dry face Grooming Details (indicate cue type and reason): pt reporting needing help to perform grooming ADLs, provided min hand over hand assist to initiate pt bringing washcloth to face, pt then minimally performing activity without physical assist                              Functional mobility during ADLs: Moderate assistance;+2 for physical assistance;+2 for safety/equipment(using Stedy ) General ADL Comments: pt completing AAROM to UE/LEs; requesting return to bed, NT present and assisting with +2 assist for sit<>stand at [facility] and positioning in bed; pt requires encouragement and max cues for participation, minimally responsive during session though does state "I can't do it" during session                        Cognition Arousal/Alertness: Awake/alert  Behavior During Therapy: Flat affect Overall Cognitive Status: Impaired/Different from baseline Area of Impairment: Following commands;Problem solving;Awareness;Attention                   Current Attention Level: Sustained   Following Commands: Follows one step commands with increased time   Awareness: Emergent Problem  Solving: Slow processing;Requires verbal cues;Requires tactile cues;Decreased initiation General Comments: Pt with cognitive deficits at baseline; follows cues with increased time and encouragement; pt minimally responsive with therapist this session        Exercises Other Exercises Other Exercises: AAROM to bil UE shoulder flexion x5 Other Exercises: AAROM bil LE ankle pumps, knee flexion/extension x5                 Pertinent Vitals/ Pain       Pain Assessment: No/denies pain                                                          Frequency  Min 3X/week        Progress Toward Goals  OT Goals(current goals can now be found in the care plan section)  Progress towards OT goals: OT to reassess next treatment  Acute Rehab OT Goals Patient Stated Goal: figure out what is wrong with her OT Goal Formulation: With patient Time For Goal Achievement: 08/10/17 Potential to Achieve Goals: Good  Plan Discharge plan remains appropriate                     AM-PAC PT "6 Clicks" Daily Activity     Outcome Measure   Help from another person eating meals?: None Help from another person taking care of personal grooming?: A Little Help from another person toileting, which includes using toliet, bedpan, or urinal?: A Lot Help from another person bathing (including washing, rinsing, drying)?: A Lot Help from another person to put on and taking off regular upper body clothing?: A Lot Help from another person to put on and taking off regular lower body clothing?: A Lot 6 Click Score: 15    End of Session Equipment Utilized During Treatment: Gait belt  OT Visit Diagnosis: Unsteadiness on feet (R26.81);Other abnormalities of gait and mobility (R26.89);Muscle weakness (generalized) (M62.81);Other symptoms and signs involving cognitive function   Activity Tolerance Patient limited by fatigue   Patient Left in bed;with call bell/phone within reach;with  bed alarm set   Nurse Communication Mobility status        Time: [id number] OT Time Calculation (min): 22 min  Charges: OT General Charges $OT Visit: 1 Visit OT Treatments $Therapeutic Activity: 8-22 mins  Marcy SirenBreanna Gerado [place], [place] Pager [phone] 07/29/2017    Orlando PennerBreanna L Conlee Sliter 07/29/2017, 3:10 PM

## 2017-07-29 NOTE — Progress Notes (Signed)
Patient ID: Sherri [name], female   DOB: Sep 25, 1955, 62 y.o.   MRN: [id number]  PROGRESS NOTE    ALLYSA GOVERNALE  FAO:[id number] DOB: Jul 09, 1955 DOA: 07/25/2017 [facility]: [facility]   Outpatient Specialists:    Brief Narrative:  Alaija [name]  is a 62 y.o. female, w Bipolar, Schizophrenis, hypertension, glucose intolerance, apparently c/o generalized weakness who was admitted with sudden onset of right sided weakness. Patient is awake, alert, and appears to have insight into her condition.. She appers to have delirium. Normal CT head. Patient refusing MRI. Repeat CT scan showed no CVA. Neurology following.  Assessment & Plan:   Principal Problem:   Adjustment disorder with depressed mood Active Problems:   Right sided weakness   UTI (urinary tract infection)   Abnormal liver function   TIA (transient ischemic attack)   #1. Adjustment disorder with depressed mood: Patient symptoms appear to be related to psychiatric issues rather than physical issues. Stroke has been ruled out no other identifiable causes. Medication adjustment as suggested by her brother does not seem to be the issue. She is refusing physical therapy. We'll continue to follow psychiatric recommendations but it appears patient may need to go to skilled facility.  #2 Right Sided Weakness: Patient has had workup so far showing no evidence of CVA.  It appears this is all psych.  Patient is saying she cannot walk but able to move all her limbs.  .    #3 Possible UTI: This has essentially been ruled out.  #4 DM2: Controlled. Continue sliding scale insulin with Metformin.  #5 Schizophrenia: Patient's condition seemed to be related to high schizophrenia and bipolar disorder.  We will consult psychiatric and consider decreasing the dose of the Haldol.  She is on Vistaril with trazodone and Haldol.  #6 Abnormal LFTs: Probably medication related.  Much improved now..   DVT prophylaxis: Lovenox Code Status:  Full Family Communication: None available Disposition Plan: ALF Consultants:   Neurology   Procedures: None Antimicrobials: Rocephin  Subjective: Patient has been refusing to walk or eat. She overall appears stable otherwise.  Objective: Vitals:   07/29/17 1958 07/30/17 0012 07/30/17 0423 07/30/17 0737  BP: (!) 163/74 (!) 148/64 (!) 142/61 136/69  Pulse: (!) 110 (!) 110 (!) 107 (!) 101  Resp: 18 16 16 16   Temp: 98.5 F (36.9 C) 98.6 F (37 C) 98.1 F (36.7 C) 98.1 F (36.7 C)  TempSrc: Oral Oral Oral Oral  SpO2: 95% 97% 98% 98%  Weight:      Height:        Intake/Output Summary (Last 24 hours) at 07/30/2017 0807 Last data filed at 07/30/2017 6962 Gross per 24 hour  Intake 480 ml  Output 750 ml  Net -270 ml   Filed Weights   07/25/17 1915 07/25/17 2026  Weight: 60.3 kg (133 lb) 60.3 kg (133 lb)    Examination:  General exam: Appears calm and comfortable  Respiratory system: Clear to auscultation. Respiratory effort normal. Cardiovascular system: S1 & S2 heard, RRR. No JVD, murmurs, rubs, gallops or clicks. No pedal edema. Gastrointestinal system: Abdomen is nondistended, soft and nontender. No organomegaly or masses felt. Normal bowel sounds heard. Central nervous system: Alert and oriented. No focal neurological deficits. Extremities: Symmetric 5 x 5 power. Skin: No rashes, lesions or ulcers Psychiatry: Judgement and insight appear normal. Mood & affect appropriate.     Data Reviewed: I have personally reviewed following labs and imaging studies  CBC: Recent Labs  Lab 07/25/17  1059 07/25/17 1107 07/28/17 0724  WBC 13.8*  --  14.9*  NEUTROABS 11.2*  --   --   HGB 15.1* 15.6* 14.6  HCT 45.1 46.0 44.3  MCV 89.0  --  89.5  PLT 286  --  323   Basic Metabolic Panel: Recent Labs  Lab 07/25/17 1059 07/25/17 1107 07/28/17 0724  NA 138 137 137  K 3.6 5.1 3.6  CL 103 104 102  CO2 20*  --  23  GLUCOSE 141* 144* 127*  BUN 28* 43* 21*  CREATININE 0.94  0.80 0.70  CALCIUM 10.5*  --  9.2   GFR: Estimated Creatinine Clearance: 62.4 mL/min (by C-G formula based on SCr of 0.7 mg/dL). Liver Function Tests: Recent Labs  Lab 07/25/17 1059  AST 92*  ALT 65*  ALKPHOS 77  BILITOT 1.2  PROT 7.7  ALBUMIN 4.6   No results for input(s): LIPASE, AMYLASE in the last 168 hours. No results for input(s): AMMONIA in the last 168 hours. Coagulation Profile: Recent Labs  Lab 07/25/17 1059  INR 0.97   Cardiac Enzymes: No results for input(s): CKTOTAL, CKMB, CKMBINDEX, TROPONINI in the last 168 hours. BNP (last 3 results) No results for input(s): PROBNP in the last 8760 hours. HbA1C: No results for input(s): HGBA1C in the last 72 hours. CBG: Recent Labs  Lab 07/29/17 0633 07/29/17 1109 07/29/17 1626 07/29/17 2148 07/30/17 0637  GLUCAP 106* 131* 109* 107* 115*   Lipid Profile: No results for input(s): CHOL, HDL, LDLCALC, TRIG, CHOLHDL, LDLDIRECT in the last 72 hours. Thyroid Function Tests: No results for input(s): TSH, T4TOTAL, FREET4, T3FREE, THYROIDAB in the last 72 hours. Anemia Panel: No results for input(s): VITAMINB12, FOLATE, FERRITIN, TIBC, IRON, RETICCTPCT in the last 72 hours. Urine analysis:    Component Value Date/Time   COLORURINE YELLOW 07/25/2017 1335   APPEARANCEUR CLOUDY (A) 07/25/2017 1335   LABSPEC 1.017 07/25/2017 1335   PHURINE 5.0 07/25/2017 1335   GLUCOSEU NEGATIVE 07/25/2017 1335   HGBUR SMALL (A) 07/25/2017 1335   BILIRUBINUR NEGATIVE 07/25/2017 1335   KETONESUR 20 (A) 07/25/2017 1335   PROTEINUR 30 (A) 07/25/2017 1335   NITRITE NEGATIVE 07/25/2017 1335   LEUKOCYTESUR NEGATIVE 07/25/2017 1335   Sepsis Labs: @LABRCNTIP (procalcitonin:4,lacticidven:4)  )No results found for this or any previous visit (from the past 240 hour(s)).       Radiology Studies: No results found.      Scheduled Meds: .  stroke: mapping our early stages of recovery book   Does not apply Once  . aspirin  300 mg  Rectal Daily   Or  . aspirin  325 mg Oral Daily  . atorvastatin  40 mg Oral q1800  . haloperidol  10 mg Oral QHS  . haloperidol  5 mg Oral BH-q7a  . hydrOXYzine  25 mg Oral Q8H  . insulin aspart  0-5 Units Subcutaneous QHS  . insulin aspart  0-9 Units Subcutaneous TID WC  . losartan  100 mg Oral Daily  . metFORMIN  500 mg Oral BID WC   Continuous Infusions:    LOS: 5 days    Time spent: 33 minutes    Zachrey Deutscher,LAWAL, MD [facility] Pager [phone]  If 7PM-7AM, please contact night-coverage [email or web address] Password [email or web address] 07/30/2017, 8:07 AM

## 2017-07-29 NOTE — Progress Notes (Signed)
RN offered to assist patient with her meal today but patient declined. She spat out food and verbalized "please take that away from me". RN updated brother of her status. Brother wanted to MD to call him for further POC. MD paged and aware.  Sim BoastHavy, RN

## 2017-07-30 DIAGNOSIS — I10 Essential (primary) hypertension: Secondary | ICD-10-CM | POA: Diagnosis present

## 2017-07-30 DIAGNOSIS — D72829 Elevated white blood cell count, unspecified: Secondary | ICD-10-CM | POA: Diagnosis present

## 2017-07-30 DIAGNOSIS — R531 Weakness: Secondary | ICD-10-CM | POA: Diagnosis not present

## 2017-07-30 LAB — GLUCOSE, CAPILLARY
GLUCOSE-CAPILLARY: 115 mg/dL — AB (ref 65–99)
GLUCOSE-CAPILLARY: 103 mg/dL — AB (ref 65–99)
Glucose-Capillary: 114 mg/dL — ABNORMAL HIGH (ref 65–99)
Glucose-Capillary: 82 mg/dL (ref 65–99)

## 2017-07-30 NOTE — Progress Notes (Signed)
Patient ID: Sherri [name], female   DOB: 11/06/1955, 62 y.o.   MRN: [id number]  PROGRESS NOTE    Sherri [name]  WUJ:[id number] DOB: 1956-03-27 DOA: 07/25/2017 [facility]: [facility]   Outpatient Specialists:    Brief Narrative:  Sherri [name]  is a 62 y.o. female, w Bipolar, Schizophrenis, hypertension, glucose intolerance, apparently c/o generalized weakness who was admitted with sudden onset of right sided weakness. Patient is awake, alert, and appears to have insight into her condition.. She appers to have delirium. Normal CT head. Patient refusing MRI. Repeat CT scan showed no CVA. Neurology and psychiatricfollowing.  Assessment & Plan:   Principal Problem:   Adjustment disorder with depressed mood Active Problems:   Right sided weakness   UTI (urinary tract infection)   Abnormal liver function   TIA (transient ischemic attack)   Benign essential HTN   Leucocytosis   #1. Adjustment disorder with depressed mood: Patient continues to refuse physical therapy or even eating food. She is clearly awake alert and oriented. She knows what she wants. Her brother has been insisting on having her medications adjusted he believes they are responsible. We will re-consult psychiatric to see if there is any change. In the meantime will attempt to transfer patient to the [facility] or rehabilitation. We'll continue to follow psychiatric recommendations but it appears patient may need to go to skilled facility.  #2 Right Sided Weakness: Patient has had workup so far showing no evidence of CVA.  It appears this is all psych.  Patient is saying she cannot walk but able to move all her limbs.      #3 Possible UTI: This has essentially been ruled out. ntibiotics discontinued.  #4 DM2: Controlled. Continue sliding scale insulin with Metformin.  #5 Schizophrenia: Patient's condition seemed to be related to high schizophrenia and bipolar disorder.  We will consult psychiatric and  consider decreasing the dose of the Haldol.  She is on Vistaril with trazodone and Haldol.  #6 Abnormal LFTs: Probably medication related.  Much improved now..  #7 hypertension: Patient on blood pressure medications. Blood pressure mostly controlled but marginally high today. Continue treatment.  #8 leukocytosis: Causes unclear. Patient afebrile. We will monitor and recheck   DVT prophylaxis: Lovenox Code Status: Full Family Communication: None available Disposition Plan: ALF Consultants:   Neurology   Procedures: None Antimicrobials: Rocephin  Subjective: Patient has been refusing to walk or eat. She overall appears stable otherwise. She is awake alert and oriented 3  Objective: Vitals:   07/30/17 0423 07/30/17 0737 07/30/17 1156 07/30/17 1703  BP: (!) 142/61 136/69 (!) 157/73 (!) 148/55  Pulse: (!) 107 (!) 101 (!) 109 (!) 110  Resp: 16 16 17 18   Temp: 98.1 F (36.7 C) 98.1 F (36.7 C) 98.4 F (36.9 C) 98.1 F (36.7 C)  TempSrc: Oral Oral Oral Oral  SpO2: 98% 98% 98% 97%  Weight:      Height:        Intake/Output Summary (Last 24 hours) at 07/30/2017 1829 Last data filed at 07/30/2017 1730 Gross per 24 hour  Intake 720 ml  Output 725 ml  Net -5 ml   Filed Weights   07/25/17 1915 07/25/17 2026  Weight: 60.3 kg (133 lb) 60.3 kg (133 lb)    Examination:  General exam: Appears calm and comfortable  Respiratory system: Clear to auscultation. Respiratory effort normal. Cardiovascular system: S1 & S2 heard, RRR. No JVD, murmurs, rubs, gallops or clicks. No pedal edema. Gastrointestinal system:  Abdomen is nondistended, soft and nontender. No organomegaly or masses felt. Normal bowel sounds heard. Central nervous system: Alert and oriented. No focal neurological deficits. Extremities: Symmetric 5 x 5 power. Skin: No rashes, lesions or ulcers Psychiatry: Judgement and insight appear normal. Mood & affect appropriate.     Data Reviewed: I have personally reviewed  following labs and imaging studies  CBC: Recent Labs  Lab 07/25/17 1059 07/25/17 1107 07/28/17 0724  WBC 13.8*  --  14.9*  NEUTROABS 11.2*  --   --   HGB 15.1* 15.6* 14.6  HCT 45.1 46.0 44.3  MCV 89.0  --  89.5  PLT 286  --  323   Basic Metabolic Panel: Recent Labs  Lab 07/25/17 1059 07/25/17 1107 07/28/17 0724  NA 138 137 137  K 3.6 5.1 3.6  CL 103 104 102  CO2 20*  --  23  GLUCOSE 141* 144* 127*  BUN 28* 43* 21*  CREATININE 0.94 0.80 0.70  CALCIUM 10.5*  --  9.2   GFR: Estimated Creatinine Clearance: 62.4 mL/min (by C-G formula based on SCr of 0.7 mg/dL). Liver Function Tests: Recent Labs  Lab 07/25/17 1059  AST 92*  ALT 65*  ALKPHOS 77  BILITOT 1.2  PROT 7.7  ALBUMIN 4.6   No results for input(s): LIPASE, AMYLASE in the last 168 hours. No results for input(s): AMMONIA in the last 168 hours. Coagulation Profile: Recent Labs  Lab 07/25/17 1059  INR 0.97   Cardiac Enzymes: No results for input(s): CKTOTAL, CKMB, CKMBINDEX, TROPONINI in the last 168 hours. BNP (last 3 results) No results for input(s): PROBNP in the last 8760 hours. HbA1C: No results for input(s): HGBA1C in the last 72 hours. CBG: Recent Labs  Lab 07/29/17 1626 07/29/17 2148 07/30/17 0637 07/30/17 1155 07/30/17 1705  GLUCAP 109* 107* 115* 103* 114*   Lipid Profile: No results for input(s): CHOL, HDL, LDLCALC, TRIG, CHOLHDL, LDLDIRECT in the last 72 hours. Thyroid Function Tests: No results for input(s): TSH, T4TOTAL, FREET4, T3FREE, THYROIDAB in the last 72 hours. Anemia Panel: No results for input(s): VITAMINB12, FOLATE, FERRITIN, TIBC, IRON, RETICCTPCT in the last 72 hours. Urine analysis:    Component Value Date/Time   COLORURINE YELLOW 07/25/2017 1335   APPEARANCEUR CLOUDY (A) 07/25/2017 1335   LABSPEC 1.017 07/25/2017 1335   PHURINE 5.0 07/25/2017 1335   GLUCOSEU NEGATIVE 07/25/2017 1335   HGBUR SMALL (A) 07/25/2017 1335   BILIRUBINUR NEGATIVE 07/25/2017 1335    KETONESUR 20 (A) 07/25/2017 1335   PROTEINUR 30 (A) 07/25/2017 1335   NITRITE NEGATIVE 07/25/2017 1335   LEUKOCYTESUR NEGATIVE 07/25/2017 1335   Sepsis Labs: @LABRCNTIP (procalcitonin:4,lacticidven:4)  )No results found for this or any previous visit (from the past 240 hour(s)).       Radiology Studies: No results found.      Scheduled Meds: .  stroke: mapping our early stages of recovery book   Does not apply Once  . aspirin  300 mg Rectal Daily   Or  . aspirin  325 mg Oral Daily  . atorvastatin  40 mg Oral q1800  . haloperidol  10 mg Oral QHS  . haloperidol  5 mg Oral BH-q7a  . hydrOXYzine  25 mg Oral Q8H  . insulin aspart  0-5 Units Subcutaneous QHS  . insulin aspart  0-9 Units Subcutaneous TID WC  . losartan  100 mg Oral Daily  . metFORMIN  500 mg Oral BID WC   Continuous Infusions:    LOS: 5 days  Time spent: 33 minutes    GARBA,LAWAL, MD [facility] Pager [phone]  If 7PM-7AM, please contact night-coverage [email or web address] Password [facility] 07/30/2017, 6:29 PM

## 2017-07-30 NOTE — Progress Notes (Signed)
Patient declined breakfast and AM medications at this time. RN will continue to encourage with ADLs.   Sim BoastHavy, RN

## 2017-07-31 DIAGNOSIS — R531 Weakness: Secondary | ICD-10-CM | POA: Diagnosis not present

## 2017-07-31 LAB — GLUCOSE, CAPILLARY
GLUCOSE-CAPILLARY: 117 mg/dL — AB (ref 65–99)
GLUCOSE-CAPILLARY: 116 mg/dL — AB (ref 65–99)
Glucose-Capillary: 110 mg/dL — ABNORMAL HIGH (ref 65–99)
Glucose-Capillary: 116 mg/dL — ABNORMAL HIGH (ref 65–99)

## 2017-07-31 MED ORDER — METOPROLOL TARTRATE 25 MG PO TABS
25.0000 mg | ORAL_TABLET | Freq: Two times a day (BID) | ORAL | Status: DC
  Administered 2017-07-31 – 2017-08-07 (×15): 25 mg via ORAL
  Filled 2017-07-31 (×10): qty 1
  Filled 2017-07-31: qty 2
  Filled 2017-07-31 (×5): qty 1

## 2017-07-31 NOTE — Progress Notes (Signed)
Patient ID: Sherri [name], female   DOB: [birth year], 62 y.o.   MRN: [id number]  PROGRESS NOTE    Sherri [name]  WUJ:[id number] DOB: [birth year] DOA: 07/25/2017 [facility]   Outpatient Specialists:    Brief Narrative:  Sherri BlalockMichelle Badal  is a 62 y.o. female, w Bipolar, Schizophrenis, hypertension, glucose intolerance, apparently c/o generalized weakness who was admitted with sudden onset of right sided weakness. Patient is awake, alert, and appears to have insight into her condition.. She appers to have delirium. Normal CT head. Patient refusing MRI. Repeat CT scan showed no CVA. Neurology and psychiatricfollowing.  Assessment & Plan:   Principal Problem:   Adjustment disorder with depressed mood Active Problems:   Right sided weakness   UTI (urinary tract infection)   Abnormal liver function   TIA (transient ischemic attack)   Benign essential HTN   Leucocytosis   #1. Adjustment disorder with depressed mood: No change in patient's condition. Patient continues to refuse physical therapy or even eating food. She is clearly awake alert and oriented. She knows what she wants. Her brother has been insisting on having her medications adjusted he believes they are responsible. We will re-consult psychiatry to see if there is any change. In the meantime will attempt [facility] or rehabilitation. We'll continue to follow psychiatric recommendations but it appears patient may need to go to skilled facility.  #2 Right Sided Weakness: Patient has had workup so far showing no evidence of CVA.  It appears this is all psych.  Patient is saying she cannot walk but able to move all her limbs.      #3 Possible UTI: This has essentially been ruled out. ntibiotics discontinued.  #4 DM2: Controlled. Continue sliding scale insulin with Metformin.  #5 Schizophrenia: Patient's condition seemed to be related to high schizophrenia and bipolar disorder.  We  will consult psychiatric and consider decreasing the dose of the Haldol.  She is on Vistaril with trazodone and Haldol.  #6 Abnormal LFTs: Probably medication related.  Much improved now..  #7 hypertension: Patient on blood pressure medications. Blood pressure mostly controlled but marginally high today. Continue treatment.  #8 leukocytosis: Causes unclear. Patient afebrile. We will monitor and recheck   DVT prophylaxis: Lovenox Code Status: Full Family Communication: None available Disposition Plan: ALF Consultants:   Neurology   Procedures: None Antimicrobials: Rocephin  Subjective: Patient has continued to be refusing to walk or eat. She overall appears stable otherwise. She is awake alert and oriented 3  Objective: Vitals:   07/31/17 0424 07/31/17 0758 07/31/17 1147 07/31/17 1605  BP: (!) 161/78 (!) 150/74 (!) 121/56 (!) 152/70  Pulse: (!) 113 (!) 104 71 88  Resp: 18 16 16 20   Temp: 98.5 F (36.9 C) 98 F (36.7 C) 98.7 F (37.1 C) 98.1 F (36.7 C)  TempSrc: Oral Oral Axillary Oral  SpO2: 98% 98% 100% 100%  Weight: 61.5 kg (135 lb 9.3 oz)     Height:        Intake/Output Summary (Last 24 hours) at 07/31/2017 1835 Last data filed at 07/31/2017 1822 Gross per 24 hour  Intake 120 ml  Output 375 ml  Net -255 ml   Filed Weights   07/25/17 1915 07/25/17 2026 07/31/17 0424  Weight: 60.3 kg (133 lb) 60.3 kg (133 lb) 61.5 kg (135 lb 9.3 oz)    Examination:  General exam: Appears calm and comfortable  Respiratory system: Clear to auscultation. Respiratory effort normal.  Cardiovascular system: S1 & S2 heard, RRR. No JVD, murmurs, rubs, gallops or clicks. No pedal edema. Gastrointestinal system: Abdomen is nondistended, soft and nontender. No organomegaly or masses felt. Normal bowel sounds heard. Central nervous system: Alert and oriented. No focal neurological deficits. Extremities: Symmetric 5 x 5 power. Skin: No rashes, lesions or ulcers Psychiatry: Judgement and  insight appear normal. Mood & affect appropriate.     Data Reviewed: I have personally reviewed following labs and imaging studies  CBC: Recent Labs  Lab 07/25/17 1059 07/25/17 1107 07/28/17 0724  WBC 13.8*  --  14.9*  NEUTROABS 11.2*  --   --   HGB 15.1* 15.6* 14.6  HCT 45.1 46.0 44.3  MCV 89.0  --  89.5  PLT 286  --  323   Basic Metabolic Panel: Recent Labs  Lab 07/25/17 1059 07/25/17 1107 07/28/17 0724  NA 138 137 137  K 3.6 5.1 3.6  CL 103 104 102  CO2 20*  --  23  GLUCOSE 141* 144* 127*  BUN 28* 43* 21*  CREATININE 0.94 0.80 0.70  CALCIUM 10.5*  --  9.2   GFR: Estimated Creatinine Clearance: 63 mL/min (by C-G formula based on SCr of 0.7 mg/dL). Liver Function Tests: Recent Labs  Lab 07/25/17 1059  AST 92*  ALT 65*  ALKPHOS 77  BILITOT 1.2  PROT 7.7  ALBUMIN 4.6   No results for input(s): LIPASE, AMYLASE in the last 168 hours. No results for input(s): AMMONIA in the last 168 hours. Coagulation Profile: Recent Labs  Lab 07/25/17 1059  INR 0.97   Cardiac Enzymes: No results for input(s): CKTOTAL, CKMB, CKMBINDEX, TROPONINI in the last 168 hours. BNP (last 3 results) No results for input(s): PROBNP in the last 8760 hours. HbA1C: No results for input(s): HGBA1C in the last 72 hours. CBG: Recent Labs  Lab 07/30/17 1705 07/30/17 2130 07/31/17 0613 07/31/17 1142 07/31/17 1608  GLUCAP 114* 82 117* 110* 116*   Lipid Profile: No results for input(s): CHOL, HDL, LDLCALC, TRIG, CHOLHDL, LDLDIRECT in the last 72 hours. Thyroid Function Tests: No results for input(s): TSH, T4TOTAL, FREET4, T3FREE, THYROIDAB in the last 72 hours. Anemia Panel: No results for input(s): VITAMINB12, FOLATE, FERRITIN, TIBC, IRON, RETICCTPCT in the last 72 hours. Urine analysis:    Component Value Date/Time   COLORURINE YELLOW 07/25/2017 1335   APPEARANCEUR CLOUDY (A) 07/25/2017 1335   LABSPEC 1.017 07/25/2017 1335   PHURINE 5.0 07/25/2017 1335   GLUCOSEU NEGATIVE  07/25/2017 1335   HGBUR SMALL (A) 07/25/2017 1335   BILIRUBINUR NEGATIVE 07/25/2017 1335   KETONESUR 20 (A) 07/25/2017 1335   PROTEINUR 30 (A) 07/25/2017 1335   NITRITE NEGATIVE 07/25/2017 1335   LEUKOCYTESUR NEGATIVE 07/25/2017 1335   Sepsis Labs: @LABRCNTIP (procalcitonin:4,lacticidven:4)  )No results found for this or any previous visit (from the past 240 hour(s)).       Radiology Studies: No results found.      Scheduled Meds: .  stroke: mapping our early stages of recovery book   Does not apply Once  . aspirin  300 mg Rectal Daily   Or  . aspirin  325 mg Oral Daily  . atorvastatin  40 mg Oral q1800  . haloperidol  10 mg Oral QHS  . haloperidol  5 mg Oral BH-q7a  . hydrOXYzine  25 mg Oral Q8H  . insulin aspart  0-5 Units Subcutaneous QHS  . insulin aspart  0-9 Units Subcutaneous TID WC  . losartan  100 mg Oral Daily  .  metFORMIN  500 mg Oral BID WC  . metoprolol tartrate  25 mg Oral BID   Continuous Infusions:    LOS: 6 days    Time spent: 33 minutes    Katherine Tout,LAWAL, MD [facility] Pager [phone]  If 7PM-7AM, please contact night-coverage [email or web address] Password [facility] 07/31/2017, 6:35 PM

## 2017-07-31 NOTE — Progress Notes (Signed)
PT called me on phone that patient verbalized "I want to die. Can you kill me?". On assessment, Patient denies any thoughts of Suicide and has no plan of killing self. I will continue to monitor closely.

## 2017-07-31 NOTE — Progress Notes (Addendum)
CM has spoken with patient's HCPOA, brother Mathis Farelbert, daily since admission. He is very concerned about the combination of medications that patient is on. These are the same medications [facility].  Mathis Farelbert would like the psychiatrist that sees his sister in the hospital to reach out to him and to look over her meds, provide daily counseling. Bedside RN updated that note left in room for psychiatry to please call patients brother.   Mathis Farelbert also concerned about the patient going into kidney failure d/t her refusing to eat. He wants to make sure patient is started on some form of nutrition if her refusing to eat continues.   Awaiting psychiatry to reevaluate. [facility] request for transfer. CM has also [facility] to see if they would [facility] patient for transfer per [facility] request.   Addendum: 1500: Received information from [facility] that patient has been approved for rehab for 32 days in one of their SNF contact facilities: [facility] in [place], [facility], [facility] in [place], [facility] in [place], [facility] in [place]. CSW updated.

## 2017-07-31 NOTE — Progress Notes (Signed)
OT Cancellation Note  Patient Details Name: Darrin LuisMichelle S Fairhurst MRN: [id number] DOB: Feb 21, 1956   Cancelled Treatment:    Reason Eval/Treat Not Completed: Other (comment)(ASked OT to wait until she finished dinner. Will attempt later date)  [facility]  Emalyn Schou, OT/L  [phone] 07/31/2017 07/31/2017, 6:01 PM

## 2017-07-31 NOTE — Progress Notes (Signed)
Physical Therapy Treatment Patient Details Name: Sherri [name] MRN: [id number] DOB: 1956/03/14 Today's Date: 07/31/2017    History of Present Illness Sherri [name]  is a 62 y.o. female, w Bipolar, Schizophrenia, hypertension, glucose intolerance, apparently c/o right sided weakness and inability to walk. CT negative    PT Comments    Patient is regressing with physical therapy. Had a very long discussion with patient with significant positive reinforcement based on prior level of function and prognosis, but patient stating, "I want to die. Can you kill me?" Does not give reasoning behind her desire other than, "My muscles are stiff. 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 RN, Nicole Cellaorothy, notified and aware of patient's statements. Session focused on AAROM exercises of lower extremity, but patient not participatory and actively guarding, stating towards the end, "Please stop. I can't do this." Will reassess next week but will sign off if no change in status. Decreased frequency.   Follow Up Recommendations  Supervision for mobility/OOB;Home health PT     Equipment Recommendations  None recommended by PT    Recommendations for Other Services       Precautions / Restrictions Precautions Precautions: Fall Restrictions Weight Bearing Restrictions: No    Mobility  Bed Mobility               General bed mobility comments: Refusal  Transfers                 General transfer comment: Refusal  Ambulation/Gait                 Stairs            Wheelchair Mobility    Modified Rankin (Stroke Patients Only)       Balance                                            Cognition Arousal/Alertness: Awake/alert Behavior During Therapy: Flat affect Overall Cognitive Status: Impaired/Different from baseline Area of Impairment: Following commands;Problem  solving;Awareness;Attention                   Current Attention Level: Sustained   Following Commands: Follows one step commands with increased time   Awareness: Emergent Problem Solving: Slow processing;Requires verbal cues;Requires tactile cues;Decreased initiation General Comments: Pt with cognitive deficits at baseline; follows cues with increased time and encouragement; pt minimally responsive with therapist this session      Exercises General Exercises - Lower Extremity Heel Slides: AAROM;5 reps;Both Hip ABduction/ADduction: 5 reps;Both;AAROM Straight Leg Raises: AAROM;Both;5 reps    General Comments        Pertinent Vitals/Pain Pain Assessment: No/denies pain    Home Living                      Prior Function            PT Goals (current goals can now be found in the care plan section) Acute Rehab PT Goals Patient Stated Goal: figure out what is wrong with her PT Goal Formulation: With patient Time For Goal Achievement: 08/09/17 Potential to Achieve Goals: Poor Progress towards PT goals: PT to reassess next treatment;Not progressing toward goals - comment    Frequency    Min 1X/week      PT Plan Current plan remains  appropriate    Co-evaluation              AM-PAC PT "6 Clicks" Daily Activity  Outcome Measure  Difficulty turning over in bed (including adjusting bedclothes, sheets and blankets)?: Unable Difficulty moving from lying on back to sitting on the side of the bed? : Unable Difficulty sitting down on and standing up from a chair with arms (e.g., wheelchair, bedside commode, etc,.)?: Unable Help needed moving to and from a bed to chair (including a wheelchair)?: Total Help needed walking in hospital room?: Total Help needed climbing 3-5 steps with a railing? : Total 6 Click Score: 6    End of Session   Activity Tolerance: Patient limited by fatigue     PT Visit Diagnosis: Unsteadiness on feet (R26.81);Repeated  falls (R29.6);Muscle weakness (generalized) (M62.81);Difficulty in walking, not elsewhere classified (R26.2);Hemiplegia and hemiparesis Hemiplegia - Right/Left: Right Hemiplegia - dominant/non-dominant: Dominant Hemiplegia - caused by: Unspecified     Time: 1439-1500 PT Time Calculation (min) (ACUTE ONLY): 21 min  Charges:  $Therapeutic Exercise: 8-22 mins                    G Codes:       Laurina Bustle, PT, DPT Acute Rehabilitation Services  Pager: [phone]    Vanetta Mulders 07/31/2017, 3:24 PM

## 2017-08-01 DIAGNOSIS — F061 Catatonic disorder due to known physiological condition: Secondary | ICD-10-CM

## 2017-08-01 DIAGNOSIS — G4700 Insomnia, unspecified: Secondary | ICD-10-CM | POA: Diagnosis not present

## 2017-08-01 DIAGNOSIS — F4321 Adjustment disorder with depressed mood: Secondary | ICD-10-CM | POA: Diagnosis not present

## 2017-08-01 DIAGNOSIS — F419 Anxiety disorder, unspecified: Secondary | ICD-10-CM | POA: Diagnosis not present

## 2017-08-01 LAB — GLUCOSE, CAPILLARY
GLUCOSE-CAPILLARY: 128 mg/dL — AB (ref 65–99)
GLUCOSE-CAPILLARY: 90 mg/dL (ref 65–99)
Glucose-Capillary: 124 mg/dL — ABNORMAL HIGH (ref 65–99)
Glucose-Capillary: 124 mg/dL — ABNORMAL HIGH (ref 65–99)

## 2017-08-01 MED ORDER — METOPROLOL TARTRATE 25 MG PO TABS: 25.0000 mg | ORAL_TABLET | Freq: Two times a day (BID) | ORAL | 0 refills | Status: AC

## 2017-08-01 MED ORDER — ASPIRIN 325 MG PO TABS: 325.0000 mg | ORAL_TABLET | Freq: Every day | ORAL | Status: DC

## 2017-08-01 MED ORDER — LORAZEPAM 1 MG PO TABS
1.0000 mg | ORAL_TABLET | Freq: Three times a day (TID) | ORAL | Status: DC
  Administered 2017-08-01 – 2017-08-06 (×15): 1 mg via ORAL
  Filled 2017-08-01 (×15): qty 1

## 2017-08-01 MED ORDER — ATORVASTATIN CALCIUM 40 MG PO TABS: 40.0000 mg | ORAL_TABLET | Freq: Every day | ORAL | 0 refills | Status: DC

## 2017-08-01 NOTE — Progress Notes (Signed)
Patient ID: Sherri [name], female   DOB: 01-28-1956, 62 y.o.   MRN: [id number]  PROGRESS NOTE    Sherri [name]  WUJ:[id number] DOB: [birth year] DOA: 07/25/2017 [facility]   Outpatient Specialists:    Brief Narrative:  Sherri [name]  is a 62 y.o. female, w Bipolar, Schizophrenis, hypertension, glucose intolerance, apparently c/o generalized weakness who was admitted with sudden onset of right sided weakness. Patient is awake, alert, and appears to have insight into her condition.. She appers to have delirium. Normal CT head. Patient refusing MRI. Repeat CT scan showed no CVA. Neurology and psychiatry following.  Assessment & Plan:   Principal Problem:   Adjustment disorder with depressed mood Active Problems:   Right sided weakness   UTI (urinary tract infection)   Abnormal liver function   TIA (transient ischemic attack)   Benign essential HTN   Leucocytosis   #1. Adjustment disorder with depressed mood:  Patient continues to refuse physical therapy or even eating food. She is clearly awake alert and oriented. She knows what she wants. Her brother has been insisting on having her medications adjusted he believes they are responsible. Psychiatric consult re-done and suggestion is to commit patient to inpatient psych. 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 Brother very involved and demanding.  #2 Right Sided Weakness: Patient has had workup so far showing no evidence of CVA.  It appears this is all psych.  Patient is saying she cannot walk but able to move all her limbs.      #3 Possible UTI: This has essentially been ruled out. antibiotics discontinued.  #4 DM2: Controlled. Continue sliding scale insulin with Metformin.  #5 Schizophrenia: Patient's condition seemed to be related to high schizophrenia and bipolar disorder.  We will consult psychiatric and consider decreasing the dose of the Haldol.  She is on Vistaril with trazodone and  Haldol.  #6 Abnormal LFTs: Probably medication related.  Much improved now..  #7 hypertension: Patient on blood pressure medications. Blood pressure mostly controlled but marginally high today. Continue treatment.  #8 leukocytosis: Causes unclear. Patient afebrile. We will monitor and recheck   DVT prophylaxis: Lovenox Code Status: Full Family Communication: None available Disposition Plan: ALF Consultants:   Neurology   Procedures: None Antimicrobials: Rocephin  Subjective: Patient has no new issues. Still refusing treatment or any activity.  Objective: Vitals:   08/01/17 0014 08/01/17 0430 08/01/17 0850 08/01/17 1131  BP: 133/61 107/67 (!) 128/56 117/62  Pulse: 81 67 81 77  Resp: 20 18 14 16   Temp: 98.5 F (36.9 C) 97.6 F (36.4 C) 98.5 F (36.9 C) 97.9 F (36.6 C)  TempSrc: Oral Oral Axillary Oral  SpO2: 100% 98% 96% 100%  Weight:  61 kg (134 lb 7.7 oz)    Height:        Intake/Output Summary (Last 24 hours) at 08/01/2017 1538 Last data filed at 08/01/2017 1013 Gross per 24 hour  Intake 120 ml  Output 550 ml  Net -430 ml   Filed Weights   07/25/17 2026 07/31/17 0424 08/01/17 0430  Weight: 60.3 kg (133 lb) 61.5 kg (135 lb 9.3 oz) 61 kg (134 lb 7.7 oz)    Examination:  General exam: Appears calm and comfortable  Respiratory system: Clear to auscultation. Respiratory effort normal. Cardiovascular system: S1 & S2 heard, RRR. No JVD, murmurs, rubs, gallops or clicks. No pedal edema. Gastrointestinal system: Abdomen is nondistended, soft and nontender. No organomegaly or masses felt. Normal bowel sounds heard.  Central nervous system: Alert and oriented. No focal neurological deficits. Extremities: Symmetric 5 x 5 power. Skin: No rashes, lesions or ulcers Psychiatry: Judgement and insight appear normal. Mood & affect appropriate.     Data Reviewed: I have personally reviewed following labs and imaging studies  CBC: Recent Labs  Lab 07/28/17 0724  WBC  14.9*  HGB 14.6  HCT 44.3  MCV 89.5  PLT 323   Basic Metabolic Panel: Recent Labs  Lab 07/28/17 0724  NA 137  K 3.6  CL 102  CO2 23  GLUCOSE 127*  BUN 21*  CREATININE 0.70  CALCIUM 9.2   GFR: Estimated Creatinine Clearance: 62.7 mL/min (by C-G formula based on SCr of 0.7 mg/dL). Liver Function Tests: No results for input(s): AST, ALT, ALKPHOS, BILITOT, PROT, ALBUMIN in the last 168 hours. No results for input(s): LIPASE, AMYLASE in the last 168 hours. No results for input(s): AMMONIA in the last 168 hours. Coagulation Profile: No results for input(s): INR, PROTIME in the last 168 hours. Cardiac Enzymes: No results for input(s): CKTOTAL, CKMB, CKMBINDEX, TROPONINI in the last 168 hours. BNP (last 3 results) No results for input(s): PROBNP in the last 8760 hours. HbA1C: No results for input(s): HGBA1C in the last 72 hours. CBG: Recent Labs  Lab 07/31/17 1142 07/31/17 1608 07/31/17 2104 08/01/17 0604 08/01/17 1131  GLUCAP 110* 116* 116* 128* 124*   Lipid Profile: No results for input(s): CHOL, HDL, LDLCALC, TRIG, CHOLHDL, LDLDIRECT in the last 72 hours. Thyroid Function Tests: No results for input(s): TSH, T4TOTAL, FREET4, T3FREE, THYROIDAB in the last 72 hours. Anemia Panel: No results for input(s): VITAMINB12, FOLATE, FERRITIN, TIBC, IRON, RETICCTPCT in the last 72 hours. Urine analysis:    Component Value Date/Time   COLORURINE YELLOW 07/25/2017 1335   APPEARANCEUR CLOUDY (A) 07/25/2017 1335   LABSPEC 1.017 07/25/2017 1335   PHURINE 5.0 07/25/2017 1335   GLUCOSEU NEGATIVE 07/25/2017 1335   HGBUR SMALL (A) 07/25/2017 1335   BILIRUBINUR NEGATIVE 07/25/2017 1335   KETONESUR 20 (A) 07/25/2017 1335   PROTEINUR 30 (A) 07/25/2017 1335   NITRITE NEGATIVE 07/25/2017 1335   LEUKOCYTESUR NEGATIVE 07/25/2017 1335   Sepsis Labs: @LABRCNTIP (procalcitonin:4,lacticidven:4)  )No results found for this or any previous visit (from the past 240 hour(s)).        Radiology Studies: No results found.      Scheduled Meds: .  stroke: mapping our early stages of recovery book   Does not apply Once  . aspirin  300 mg Rectal Daily   Or  . aspirin  325 mg Oral Daily  . atorvastatin  40 mg Oral q1800  . haloperidol  10 mg Oral QHS  . haloperidol  5 mg Oral BH-q7a  . hydrOXYzine  25 mg Oral Q8H  . insulin aspart  0-5 Units Subcutaneous QHS  . insulin aspart  0-9 Units Subcutaneous TID WC  . LORazepam  1 mg Oral TID  . losartan  100 mg Oral Daily  . metFORMIN  500 mg Oral BID WC  . metoprolol tartrate  25 mg Oral BID   Continuous Infusions:    LOS: 7 days    Time spent: 33 minutes    Chidubem Chaires,LAWAL, MD [facility] Pager [phone]  If 7PM-7AM, please contact night-coverage [email or web address] Password [email or web address] 08/01/2017, 3:38 PM

## 2017-08-01 NOTE — Progress Notes (Signed)
Occupational Therapy Treatment Patient Details Name: Sherri [name] MRN: [id number] DOB: [birth year] Today's Date: 08/01/2017    History of present illness Sherri [name]  is a 62 y.o. female, w Bipolar, Schizophrenis, hypertension, glucose intolerance, apparently c/o right sided weakness and inability to walk. CT negative   OT comments  Session focused on self feeding. Pt required cup/food being placed in hand then was able to bring food to mouth. Pt asking for water/tea. Pt water @ 50% of her chicken salad. Pt placed in chair position. Pt with rigidity throughout. Pt able to lift BLE to place pillow under legs. Keeping feet in plantar flexed.   Pt states "I want to die, just kill me, I want to die". Nsg notified.    Follow Up Recommendations  SNF;Supervision/Assistance - 24 hour    Equipment Recommendations  None recommended by OT    Recommendations for Other Services      Precautions / Restrictions Precautions Precautions: Fall Precaution Comments: contracture developmet       Mobility Bed Mobility               General bed mobility comments: Max A to roll toward L. Pt grasping rail  Transfers      Pt declined                Balance                                           ADL either performed or assessed with clinical judgement   ADL   Eating/Feeding: Minimal assistance Eating/Feeding Details (indicate cue type and reason): set up and required OT to put cup in hand/grasp food. Does better with finger food. Ate @ 50% of chicken salad                                         Vision       Perception     Praxis      Cognition Arousal/Alertness: Awake/alert Behavior During Therapy: Flat affect Overall Cognitive Status: Impaired/Different from baseline Area of Impairment: Orientation;Memory;Following commands;Safety/judgement;Awareness;Problem solving                 Orientation Level: Disoriented  to;Time Current Attention Level: Sustained Memory: Decreased short-term memory Following Commands: Follows one step commands with increased time Safety/Judgement: Decreased awareness of safety;Decreased awareness of deficits Awareness: Emergent Problem Solving: Slow processing;Decreased initiation;Difficulty sequencing;Requires verbal cues;Requires tactile cues          Exercises Exercises: Other exercises General Exercises - Upper Extremity Shoulder Flexion: Left;AROM;AAROM   Shoulder Instructions       General Comments      Pertinent Vitals/ Pain       Pain Assessment: Faces Faces Pain Scale: Hurts little more Pain Location: general discomfort  Home Living                                          Prior Functioning/Environment              Frequency  Min 2X/week        Progress Toward Goals  OT Goals(current goals can now be found in the care plan section)  Progress towards OT goals: Not progressing toward goals - comment  Acute Rehab OT Goals Patient Stated Goal: to die OT Goal Formulation: With patient Time For Goal Achievement: 08/10/17 Potential to Achieve Goals: Fair ADL Goals Pt Will Perform Grooming: with set-up;with supervision;sitting Pt Will Perform Upper Body Dressing: with set-up;with supervision;sitting Pt Will Transfer to Toilet: with mod assist;bedside commode;stand pivot transfer Pt Will Perform Toileting - Clothing Manipulation and hygiene: with min assist;sit to/from stand Additional ADL Goal #1: Pt will perform bed mobility with Min A in preparation for ADLs  Plan Frequency needs to be updated;Discharge plan needs to be updated    Co-evaluation                 AM-PAC PT "6 Clicks" Daily Activity     Outcome Measure   Help from another person eating meals?: A Little Help from another person taking care of personal grooming?: A Lot Help from another person toileting, which includes using toliet, bedpan, or  urinal?: A Lot Help from another person bathing (including washing, rinsing, drying)?: A Lot Help from another person to put on and taking off regular upper body clothing?: A Lot Help from another person to put on and taking off regular lower body clothing?: A Lot 6 Click Score: 13    End of Session    OT Visit Diagnosis: Unsteadiness on feet (R26.81);Other abnormalities of gait and mobility (R26.89);Muscle weakness (generalized) (M62.81);Other symptoms and signs involving cognitive function   Activity Tolerance Other (comment)   Patient Left in bed;with call bell/phone within reach;with bed alarm set   Nurse Communication Mobility status        Time: [id number] OT Time Calculation (min): 25 min  Charges: OT General Charges $OT Visit: 1 Visit OT Treatments $Self Care/Home Management : 23-37 mins  [facility] Hospital[facility] Erice Ahles, OT/L  [phone] 08/01/2017   Lilliah Priego,HILLARY 08/01/2017, 3:55 PM

## 2017-08-01 NOTE — Progress Notes (Signed)
CSW noting discharge plan changed to inpatient psych. Per RNCM, patient's brother/HCPOA [facility] Monday morning.  CSW will continue to follow.  Sherri [name], [place] Clinical Social Worker [phone]

## 2017-08-01 NOTE — Consult Note (Signed)
[facility] Psych Consult Progress Note  08/01/2017 8:35 AM SKYLA CHAMPAGNE  MRN:  [id number] Subjective:   Ms. Seiler was last seen on 4/1 for depression. She reported feeling depressed about her medical condition (onset of right sided weakness 2 days prior to hospital admission). Her mood decline was noted to be secondary to an adjustment disorder. Haldol was increased to 10 mg qhs which is her home dose. In the interim, her workup has been unremarkable so her symptoms are thought to be psychogenic. She has been refusing PT and has not been eating.   On interview Ms. Boehlke reports that she wants to die but she cannot do it.  She asks this notewriter to help her die.  She reports that she is sleeping okay.  Her appetite has been poor.  She denies HI or AVH.  She becomes irritable throughout the interview and states, "these are stupid questions."  Principal Problem: Catatonia Diagnosis:   Patient Active Problem List   Diagnosis Date Noted  . Benign essential HTN [I10] 07/30/2017  . Leucocytosis [D72.829] 07/30/2017  . Adjustment disorder with depressed mood [F43.21]   . TIA (transient ischemic attack) [G45.9] 07/26/2017  . Right sided weakness [R53.1] 07/25/2017  . UTI (urinary tract infection) [N39.0] 07/25/2017  . Abnormal liver function [R94.5] 07/25/2017  . 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 Schizoaffective disorder, bipolar type (HCC) [F25.0] 05/31/2017  . 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   Past Medical History:  Past Medical History:  Diagnosis Date  . Bipolar 1 disorder (HCC)   . Hypertension   . Prediabetes   . Schizophrenia (HCC)   . Tachycardia     Past Surgical History:  Procedure Laterality Date  . COLON SURGERY     Family History: History reviewed. No pertinent family history. Family  Psychiatric  History: Denies Social History:  Social History   Substance and Sexual Activity  Alcohol Use No  . Frequency: Never     Social History   Substance and Sexual Activity  Drug Use No    Social History   Socioeconomic History  . Marital status: Divorced    Spouse name: Not on file  . Number of children: Not on file  . Years of education: Not on file  . Highest education level: Not on file  Occupational History  . Not on file  Social Needs  . Financial resource strain: Not on file  . Food insecurity:    Worry: Not on file    Inability: Not on file  . Transportation needs:    Medical: Not on file    Non-medical: Not on file  Tobacco Use  . Smoking status: Never Smoker  . Smokeless tobacco: Never Used  Substance and Sexual Activity  . Alcohol use: No    Frequency: Never  . Drug use: No  . Sexual activity: Not on file  Lifestyle  . Physical activity:    Days per week: Not on file    Minutes per session: Not on file  . Stress: Not on file  Relationships  . Social connections:    Talks on phone: Not on file    Gets together: Not on file    Attends religious service: Not on file    Active member of club or organization: Not on file    Attends meetings of clubs or organizations: Not on file  Relationship status: Not on file  Other Topics Concern  . Not on file  Social History Narrative  . Not on file    Sleep: Good  Appetite:  Poor  Current Medications: Current Facility-Administered Medications  Medication Dose Route Frequency Provider Last Rate Last Dose  .  stroke: mapping our early stages of recovery book   Does not apply Once Pearson Grippe, MD      . acetaminophen (TYLENOL) tablet 650 mg  650 mg Oral Q4H PRN Pearson Grippe, MD       Or  . acetaminophen (TYLENOL) solution 650 mg  650 mg Per Tube Q4H PRN Pearson Grippe, MD       Or  . acetaminophen (TYLENOL) suppository 650 mg  650 mg Rectal Q4H PRN Pearson Grippe, MD      . aspirin suppository 300 mg  300  mg Rectal Daily Pearson Grippe, MD       Or  . aspirin tablet 325 mg  325 mg Oral Daily Pearson Grippe, MD   325 mg at 07/31/17 7564  . atorvastatin (LIPITOR) tablet 40 mg  40 mg Oral q1800 Rinehuls, David L, PA-C   40 mg at 07/31/17 1737  . haloperidol (HALDOL) tablet 10 mg  10 mg Oral QHS Earlie Lou L, MD   10 mg at 08/01/17 0006  . haloperidol (HALDOL) tablet 5 mg  5 mg Oral [name], MD   5 mg at 08/01/17 3329  . hydrOXYzine (ATARAX/VISTARIL) tablet 25 mg  25 mg Oral Q8H Pearson Grippe, MD   25 mg at 08/01/17 5188  . insulin aspart (novoLOG) injection 0-5 Units  0-5 Units Subcutaneous QHS Pearson Grippe, MD      . insulin aspart (novoLOG) injection 0-9 Units  0-9 Units Subcutaneous TID WC Pearson Grippe, MD   1 Units at 08/01/17 0630  . losartan (COZAAR) tablet 100 mg  100 mg Oral Daily Pearson Grippe, MD   100 mg at 07/31/17 0929  . metFORMIN (GLUCOPHAGE) tablet 500 mg  500 mg Oral BID WC Pearson Grippe, MD   500 mg at 07/31/17 1737  . metoprolol tartrate (LOPRESSOR) tablet 25 mg  25 mg Oral BID Rometta Emery, MD   25 mg at 07/31/17 2331  . traZODone (DESYREL) tablet 50 mg  50 mg Oral QHS PRN Pearson Grippe, MD        Lab Results:  Results for orders placed or performed during the hospital encounter of 07/25/17 (from the past 48 hour(s))  Glucose, capillary     Status: Abnormal   Collection Time: 07/30/17 11:55 AM  Result Value Ref Range   Glucose-Capillary 103 (H) 65 - 99 mg/dL   Comment 1 Notify RN    Comment 2 Document in Chart   Glucose, capillary     Status: Abnormal   Collection Time: 07/30/17  5:05 PM  Result Value Ref Range   Glucose-Capillary 114 (H) 65 - 99 mg/dL   Comment 1 Notify RN    Comment 2 Document in Chart   Glucose, capillary     Status: None   Collection Time: 07/30/17  9:30 PM  Result Value Ref Range   Glucose-Capillary 82 65 - 99 mg/dL   Comment 1 Notify RN    Comment 2 Document in Chart   Glucose, capillary     Status: Abnormal   Collection Time: 07/31/17  6:13 AM   Result Value Ref Range   Glucose-Capillary 117 (H) 65 - 99 mg/dL   Comment 1  Notify RN    Comment 2 Document in Chart   Glucose, capillary     Status: Abnormal   Collection Time: 07/31/17 11:42 AM  Result Value Ref Range   Glucose-Capillary 110 (H) 65 - 99 mg/dL   Comment 1 Notify RN    Comment 2 Document in Chart   Glucose, capillary     Status: Abnormal   Collection Time: 07/31/17  4:08 PM  Result Value Ref Range   Glucose-Capillary 116 (H) 65 - 99 mg/dL   Comment 1 Notify RN    Comment 2 Document in Chart   Glucose, capillary     Status: Abnormal   Collection Time: 07/31/17  9:04 PM  Result Value Ref Range   Glucose-Capillary 116 (H) 65 - 99 mg/dL   Comment 1 Notify RN    Comment 2 Document in Chart   Glucose, capillary     Status: Abnormal   Collection Time: 08/01/17  6:04 AM  Result Value Ref Range   Glucose-Capillary 128 (H) 65 - 99 mg/dL   Comment 1 Notify RN    Comment 2 Document in Chart     Blood Alcohol level:  Lab Results  Component Value Date   ETH <10 05/30/2017   ETH <10 04/11/2017    Musculoskeletal: Strength & Muscle Tone: She has muscle rigidity in all 4 extremities and waxy flexibility. Gait & Station: UTA since patient was lying in bed. Patient leans: N/A  Psychiatric Specialty Exam: Physical Exam  Nursing note and vitals reviewed. Constitutional: She appears well-developed and well-nourished.  HENT:  Head: Normocephalic and atraumatic.  Neck: Normal range of motion.  Respiratory: Effort normal.  Musculoskeletal:  Muscle rigidity in all 4 extremities.   Neurological: She is alert.  Psychiatric: Her speech is normal. She is withdrawn. Cognition and memory are normal. She expresses impulsivity. She exhibits a depressed mood. She expresses suicidal ideation.    Review of Systems  Constitutional: Negative for chills and fever.  Gastrointestinal: Negative for abdominal pain, constipation, diarrhea, nausea and vomiting.   Psychiatric/Behavioral: Positive for depression and suicidal ideas. Negative for hallucinations. The patient does not have insomnia.   All other systems reviewed and are negative.   Blood pressure 107/67, pulse 67, temperature 97.6 F (36.4 C), temperature source Oral, resp. rate 18, height 5\' 2"  (1.575 m), weight 61 kg (134 lb 7.7 oz), SpO2 98 %.Body mass index is 24.6 kg/m.  General Appearance: Fairly Groomed, middle aged, Caucasian female, wearing a hospital gown and lying in bed on her left side. NAD.   Eye Contact:  Poor  Speech:  Slow  Volume:  Decreased  Mood:  "I want to die."  Affect:  Flat  Thought Process:  Goal Directed, Linear and Descriptions of Associations: Intact  Orientation:  Full (Time, Place, and Person)  Thought Content:  Rumination  Suicidal Thoughts:  Yes.  without intent/plan  Homicidal Thoughts:  No  Memory:  Immediate;   Fair Recent;   Fair Remote;   Fair  Judgement:  Poor  Insight:  Fair  Psychomotor Activity:  Psychomotor Retardation  Concentration:  Concentration: Fair and Attention Span: Fair  Recall:  [employer] of Knowledge:  Fair  Language:  Fair  Akathisia:  No  Handed:  Right  AIMS (if indicated):   N/A  Assets:  Housing  ADL's:  Impaired  Cognition: Impaired due to underlying psychiatric illness.   Sleep:   Okay    Assessment:  KYLEIGH NANNINI is a 62 y.o.  female who was admitted with right sided weakness and a question of CVA/TIA. Her workup has been unremarkable. Today she appears withdrawn and is endorsing passive SI. On exam, she has psychomotor retardation, muscle rigidity in all 4 extremities and waxy flexibility which is concerning for catatonia. Recommend starting an Ativan challenge which is diagnostic. She warrants inpatient psychiatric hospitalization for further stabilization.   Treatment Plan Summary: -Start Ativan 1 mg TID for catatonic symptoms. Can increase up to 2 mg TID for symptom management.  -Continue Trazodone 50  mg qhs PRN for insomnia. -Continue Atarax 25 mg q 8 hours PRN for anxiety. -Continue home Haldol 5 mg daily and 10 mg qhs for psychosis.  -Continue monthly Haldol Decanoate 100 mg (last received on 07/09/17 and next due on 08/08/17).  -EKG reviewed and QTc 478 on 3/29. Please closely monitor when starting or increasing QTc prolonging agents.  -Patient warrants inpatient psychiatric hospitalization given high risk of harm to self. -Patient warrants 1:1 bedside sitter.  -Please pursue involuntary commitment if patient refuses voluntary psychiatric hospitalization or attempts to leave the hospital.  -Will sign off on patient at this time. Please consult psychiatry again as needed.     Cherly BeachJacqueline J Norman, DO 08/01/2017, 8:35 AM

## 2017-08-01 NOTE — Progress Notes (Signed)
CM spoke to IndependenceAlbert, pts brother, over the phone about his concerns with his sister. Sherri [name] is very concerned and frustrated that psychiatry has not seen her since 07/28/2017. He is also concerned about her not eating and asked about IV nutrition. Information passed to Dr Mikeal HawthorneGarba.  CM informed Sherri [name] about the approval of rehab [facility]. Pt not able to d/c to a rehab facility until she is participating in therapies. Sherri [name] made aware. CM following.

## 2017-08-01 NOTE — Progress Notes (Signed)
   08/01/17 0800  Clinical Encounter Type  Visited With Health care provider  Visit Type Spiritual support;Psychological support  Referral From Nurse  Consult/Referral To Chaplain  Recommendations revisit with PT  Chaplain visited with the PT nurse to talk about spiritual care consult and the notes which state that psyche was also called.  PT history and current withdrawn state indicate something is not at baseline.  Chaplain did not speak with PT directly and was asked to revisit once the PT had waken from sleeping.

## 2017-08-01 NOTE — Plan of Care (Signed)
  Problem: Education: Goal: Knowledge of General Education information will improve Outcome: Progressing Note:  POC reviewed with pt.   

## 2017-08-02 DIAGNOSIS — G459 Transient cerebral ischemic attack, unspecified: Secondary | ICD-10-CM

## 2017-08-02 DIAGNOSIS — F4321 Adjustment disorder with depressed mood: Secondary | ICD-10-CM

## 2017-08-02 DIAGNOSIS — R945 Abnormal results of liver function studies: Secondary | ICD-10-CM

## 2017-08-02 DIAGNOSIS — I10 Essential (primary) hypertension: Secondary | ICD-10-CM | POA: Diagnosis not present

## 2017-08-02 DIAGNOSIS — F061 Catatonic disorder due to known physiological condition: Secondary | ICD-10-CM | POA: Diagnosis not present

## 2017-08-02 DIAGNOSIS — D72829 Elevated white blood cell count, unspecified: Secondary | ICD-10-CM

## 2017-08-02 LAB — GLUCOSE, CAPILLARY
GLUCOSE-CAPILLARY: 97 mg/dL (ref 65–99)
GLUCOSE-CAPILLARY: 129 mg/dL — AB (ref 65–99)
GLUCOSE-CAPILLARY: 103 mg/dL — AB (ref 65–99)

## 2017-08-02 NOTE — Progress Notes (Signed)
IV is not removed    08/02/17 2000  [REMOVED] Peripheral IV 07/25/17 Left Antecubital  Removal Date/Time: 08/02/17 2235  Placement Date/Time: 07/25/17 2208   Size (Gauge): 20 G  Orientation: Left  Location: Antecubital  Site Prep: Chlorhexidine  Local Anesthetic: None  Person Inserting Catheter: Ryland RN  Insertion attempts: 2  Patient...  Site Assessment Clean;Dry;Intact  Line Status Saline locked  Dressing Type Transparent  Dressing Status Clean;Dry;Intact

## 2017-08-02 NOTE — Progress Notes (Signed)
PROGRESS NOTE  Sherri LuisMichelle S Barberi  ONG:[id number] DOB: [birth year] DOA: 07/25/2017 [facility] Narrative: Sherri [name] is a 62 y.o. female with a history of bipolar 1 disorder, schizophrenia [facility] for worsening psychosis and paranoia, HTN, pre-diabetes who presented to the ED from ALF with generalized weakness/frequent falls and right sided weakness with decreased speaking, though her minimal engagement makes history very limited. Initial evaluation included a negative CT of the head though code stroke was called and [facility] weakness had begun to resolve, noted to have been moving all extremities without focal deficit. Also noted was pyuria on urinalysis, and the patient was admitted for weakness and presumed UTI on ceftriaxone. The patient refused MRI, was started on aspirin 325mg , lipitor 40mg  per neurology, and   Psychiatry evaluated the patient, feeling depressed mood was due to adjustment disorder and recommended increase of home haldol 5mg  > 10mg  qHS in addition to monthly depot haldol and home medications. She has not been taking medications, and has a history of this at previous psychiatric hospitalization. Due to worsening catatonia and suicidal ideation, psychiatry was reconsulted on 4/5 recommending ativan, 1:1 bedside sitter and inpatient psychiatric hospitalization given her high risk of harm to self. Also recommended IVC if pt attempted to leave AMA or refused psychiatric hospitalization.  Assessment & Plan: Principal Problem:   Catatonia Active Problems:   Right sided weakness   UTI (urinary tract infection)   Abnormal liver function   TIA (transient ischemic attack)   Adjustment disorder with depressed mood   Benign essential HTN   Leucocytosis  Catatonia, suicidal ideation, history of schizophrenia, bipolar 1 disorder: Pt with psychomotor retardation, rigidity with waxy flexibility not felt to be consistent  with organic neurological lesion. - Psychiatry recommends ativan challenge, ordered, and bedside sitter until inpatient psychiatry hospitalization can be arranged.  - Continue Trazodone 50 mg qhs PRN for insomnia. - Continue Atarax 25 mg q 8 hours PRN for anxiety. - Continue home Haldol 5 mg daily and 10 mg qhs for psychosis.  - Continue monthly Haldol Decanoate 100 mg (last received on 07/09/17 and next due on 08/08/17). QTc 478msec.  Right-sided weakness: Not cooperative with much of exam, instead states "I just want to die." Previous providers and sitters have reported significant discrepancies in movement of limbs. Work up not consistent with CVA or cord pathology.  - Monitor, PT  Possible UTI: +Pyuria on dirty catch urine, treated with ceftriaxone x3 days. No culture sent. Remains afebrile.  - Will recheck leukocytosis and if this continues, due to inability to evoke history by pt, will send to culture and empirically treat.  T2DM: HbA1c 6.4% indicating good chronic control. Has remained at inpatient goal with very little insulin given.  - Check CBG qAM instead of AC/HS. Stop SSI to minimize interventions of lower yield.  Abnormal LFTs: Very mild with AST>ALT suggestive of medication. Hepatic steatosis on suggested by U/S. Hepatitis panel negative, HIV negativeMuch improved now..  Hypertension:  - Continue medications and monitor  DVT prophylaxis: Lovenox Code Status: Full Family Communication: None available Disposition Plan: Inpatient psychiatric hospital Consultants: Psychiatry, neurology Procedures: None Antimicrobials: Ceftriaxone 3/29 - 3/31  Subjective: Did not take medications per RN today, no other events noted. Pt with sparse speech but denies pain, states she's weak all over. Wants to die, but has no specific plan to harm herself or others. Otherwise would not answer questions about taking medications, urinary symptoms, or any other  ROS.  Objective: BP (!) 148/52  (BP Location: Right Arm)   Pulse (!) 102   Temp 98.5 F (36.9 C) (Oral)   Resp 16   Ht 5\' 2"  (1.575 m)   Wt 61 kg (134 lb 7.7 oz)   SpO2 99%   BMI 24.60 kg/m   Gen: 62 y.o. female laying on left side resting quietly. Pulm: Non-labored breathing room air. Clear to auscultation bilaterally.  CV: Regular rate and rhythm. No murmur, rub, or gallop. No JVD, no pedal edema. GI: Abdomen soft, non-tender, non-distended, with normoactive bowel sounds. No organomegaly or masses felt. Ext: Warm, no deformities Skin: No rashes, lesions or ulcers Neuro: Alert, appears oriented, not cooperative with exam but denies numbness in extremities or face when checked. No aphasia evident. Will squeeze my fingers in hands weakly bilaterally, otherwise there is some resistance to arm ROM. Psych: Restricted affect, depressed mood.   CBG: Recent Labs  Lab 08/01/17 1131 08/01/17 1605 08/01/17 2116 08/02/17 0624 08/02/17 1128  GLUCAP 124* 124* 90 97 129*   Time spent: 25 minutes.  Hazeline Junker, MD [facility] Pager [phone]  If 7PM-7AM, please contact night-coverage [email or web address] Password [email or web address] 08/02/2017, 1:00 PM

## 2017-08-03 DIAGNOSIS — F061 Catatonic disorder due to known physiological condition: Secondary | ICD-10-CM | POA: Diagnosis not present

## 2017-08-03 DIAGNOSIS — I10 Essential (primary) hypertension: Secondary | ICD-10-CM | POA: Diagnosis not present

## 2017-08-03 DIAGNOSIS — F4321 Adjustment disorder with depressed mood: Secondary | ICD-10-CM | POA: Diagnosis not present

## 2017-08-03 DIAGNOSIS — R945 Abnormal results of liver function studies: Secondary | ICD-10-CM | POA: Diagnosis not present

## 2017-08-03 LAB — COMPREHENSIVE METABOLIC PANEL
ALT: 39 U/L (ref 14–54)
AST: 27 U/L (ref 15–41)
Albumin: 3.6 g/dL (ref 3.5–5.0)
Alkaline Phosphatase: 86 U/L (ref 38–126)
Anion gap: 12 (ref 5–15)
BILIRUBIN TOTAL: 0.9 mg/dL (ref 0.3–1.2)
BUN: 30 mg/dL — AB (ref 6–20)
CO2: 22 mmol/L (ref 22–32)
Calcium: 9.4 mg/dL (ref 8.9–10.3)
Chloride: 103 mmol/L (ref 101–111)
Creatinine, Ser: 0.85 mg/dL (ref 0.44–1.00)
GFR calc Af Amer: >60 mL/min (ref >60)
GFR calc non Af Amer: >60 mL/min (ref >60)
Glucose, Bld: 114 mg/dL — ABNORMAL HIGH (ref 65–99)
POTASSIUM: 3.8 mmol/L (ref 3.5–5.1)
Sodium: 137 mmol/L (ref 135–145)
TOTAL PROTEIN: 6.9 g/dL (ref 6.5–8.1)

## 2017-08-03 LAB — URINALYSIS, ROUTINE W REFLEX MICROSCOPIC
BILIRUBIN URINE: NEGATIVE
GLUCOSE, UA: NEGATIVE mg/dL
KETONES UR: 20 mg/dL — AB
Nitrite: NEGATIVE
PH: 5 (ref 5.0–8.0)
PROTEIN: 30 mg/dL — AB
Specific Gravity, Urine: 1.023 (ref 1.005–1.030)

## 2017-08-03 LAB — CBC
HEMATOCRIT: 43.9 % (ref 36.0–46.0)
HEMOGLOBIN: 14.7 g/dL (ref 12.0–15.0)
MCH: 29.7 pg (ref 26.0–34.0)
MCHC: 33.5 g/dL (ref 30.0–36.0)
MCV: 88.7 fL (ref 78.0–100.0)
Platelets: 314 10*3/uL (ref 150–400)
RBC: 4.95 MIL/uL (ref 3.87–5.11)
RDW: 12.4 % (ref 11.5–15.5)
WBC: 12.5 10*3/uL — AB (ref 4.0–10.5)

## 2017-08-03 LAB — GLUCOSE, CAPILLARY: Glucose-Capillary: 116 mg/dL — ABNORMAL HIGH (ref 65–99)

## 2017-08-03 NOTE — Progress Notes (Signed)
PROGRESS NOTE  Sherri [name]  ZOX:[id number] DOB: 15-Nov-1955 DOA: 07/25/2017 PCP: [facility] Brief Narrative: Sherri [name] is a 62 y.o. female with a history of bipolar 1 disorder, schizophrenia with recent [facility] admission 2/5-3/20 for worsening psychosis and paranoia, HTN, pre-diabetes who presented to the ED from ALF with generalized weakness/frequent falls and right sided weakness with decreased speaking, though her minimal engagement makes history very limited. Initial evaluation included a negative CT of the head though code stroke was called and she was transferred to [facility] where weakness had begun to resolve, noted to have been moving all extremities without focal deficit. Also noted was pyuria on urinalysis, and the patient was admitted for weakness and presumed UTI on ceftriaxone. The patient refused MRI, was started on aspirin 325mg , lipitor 40mg  per neurology, and   Psychiatry evaluated the patient, feeling depressed mood was due to adjustment disorder and recommended increase of home haldol 5mg  > 10mg  qHS in addition to monthly depot haldol and home medications. She has not been taking medications, and has a history of this at previous psychiatric hospitalization. Due to worsening catatonia and suicidal ideation, psychiatry was reconsulted on 4/5 recommending ativan, 1:1 bedside sitter and inpatient psychiatric hospitalization given her high risk of harm to self. Also recommended IVC if pt attempted to leave AMA or refused psychiatric hospitalization.  Assessment & Plan: Principal Problem:   Catatonia Active Problems:   Right sided weakness   UTI (urinary tract infection)   Abnormal liver function   TIA (transient ischemic attack)   Adjustment disorder with depressed mood   Benign essential HTN   Leucocytosis  Catatonia, suicidal ideation, history of schizophrenia, bipolar 1 disorder: Pt with psychomotor retardation, rigidity with waxy flexibility not felt to be consistent  with organic neurological lesion. - Psychiatry recommended ativan challenge, ordered, and bedside sitter until inpatient psychiatry hospitalization can be arranged.  - Continue Trazodone 50 mg qhs PRN for insomnia. - Continue Atarax 25 mg q 8 hours PRN for anxiety. - Continue home Haldol 5 mg daily and 10 mg qhs for psychosis.  - Continue monthly Haldol Decanoate 100 mg (last received on 07/09/17 and next due on 08/08/17). QTc .  Right-sided weakness: Not cooperative with much of exam, instead states "I just want to die." Previous providers and sitters have reported significant discrepancies in movement of limbs. Work up not consistent with CVA or cord pathology.  - Monitor, PT  Possible UTI: +Pyuria on dirty catch urine, treated with ceftriaxone x3 days. No culture sent. Remains afebrile.  - Continues to have mild leukocytosis. Will check UA and culture. No treatment unless febrile.  T2DM: HbA1c 6.4% indicating good chronic control. Has remained at inpatient goal with very little insulin given.  - Check CBG qAM  Abnormal LFTs: Very mild with AST>ALT suggestive of medication. Hepatic steatosis on suggested by U/S. Hepatitis panel negative, HIV negative. Resolved.  Hypertension:  - Continue medications and monitor, on upper limit of normal, but not indicating change in therapy.  DVT prophylaxis: Lovenox Code Status: Full Family Communication: None available Disposition Plan: Inpatient psychiatric hospital Consultants: Psychiatry, neurology Procedures: None Antimicrobials: Ceftriaxone 3/29 - 3/31  Subjective: Very modestly more interactive with me today. Complains of stiffness in all extremities which she states is better than yesterday. Took medications as ordered yesterday. Would answer no further questions. Per sitter report, continues to have use of arms and started eating some yesterday.   Objective: BP (!) 146/80 (BP Location: Right Arm)   Pulse 97  Temp 98.3 F  (36.8 C) (Oral)   Resp 18   Ht 5\' 2"  (1.575 m)   Wt 61 kg (134 lb 7.7 oz)   SpO2 97%   BMI 24.60 kg/m   Gen: 62 y.o. female laying on left side resting quietly. Pulm: Non-labored breathing room air. Clear to auscultation bilaterally.  CV: Regular rate and rhythm. No murmur, rub, or gallop. No JVD, no pedal edema. GI: Abdomen soft, non-tender, non-distended, with normoactive bowel sounds. No organomegaly or masses felt. Ext: Warm, no deformities Skin: No rashes, lesions or ulcers Neuro: Alert, appears oriented, not cooperative with exam but denies numbness in extremities or face when checked. No aphasia evident. Will squeeze my fingers in hands weakly bilaterally (actually slightly more strength today), otherwise there is some resistance to arm ROM. Psych: Restricted affect, depressed mood.   CBG: Recent Labs  Lab 08/01/17 2116 08/02/17 0624 08/02/17 1128 08/02/17 2145 08/03/17 0558  GLUCAP 90 97 129* 103* 116*   Time spent: 25 minutes.  Hazeline Junkeryan Grunz, MD [facility] Pager [phone]  If 7PM-7AM, please contact night-coverage [email or web address] Password [email or web address] 08/03/2017, 2:37 PM

## 2017-08-03 NOTE — Progress Notes (Signed)
Physical Therapy Treatment Patient Details Name: Sherri [name] MRN: [id number] DOB: 02-10-1956 Today's Date: 08/03/2017    History of Present Illness Sherri [name]  is a 62 y.o. female, w Bipolar, Schizophrenis, hypertension, glucose intolerance, apparently c/o right sided weakness and inability to walk. CT negative    PT Comments    Patient was able to modestly participate with therapies. Tolerated bed level and EOB ~8 minutes. Patient moving all 4 extremities in limited fashion with intermittent inconsistency (able to elevate LEs back to bed but minimal active range during exercises). Current POC remains appropriate.  Follow Up Recommendations  Supervision for mobility/OOB;Home health PT     Equipment Recommendations  None recommended by PT    Recommendations for Other Services OT consult;Speech consult     Precautions / Restrictions Precautions Precautions: Fall Precaution Comments: contracture developmet    Mobility  Bed Mobility Overal bed mobility: Needs Assistance Bed Mobility: Sit to Supine     Supine to sit: Max assist Sit to supine: Mod assist   General bed mobility comments: Max assist to roll, able to pull to assist with coming to upright, moderate assist to return to bed, was able to show LE elevation to bed  Transfers                    Ambulation/Gait                 Stairs            Wheelchair Mobility    Modified Rankin (Stroke Patients Only)       Balance   Sitting-balance support: No upper extremity supported Sitting balance-Leahy Scale: Poor Sitting balance - Comments: some posterior bias                                    Cognition Arousal/Alertness: Awake/alert Behavior During Therapy: Flat affect Overall Cognitive Status: Impaired/Different from baseline Area of Impairment: Orientation;Memory;Following commands;Safety/judgement;Awareness;Problem solving                  Orientation Level: Disoriented to;Time Current Attention Level: Sustained Memory: Decreased short-term memory Following Commands: Follows one step commands with increased time Safety/Judgement: Decreased awareness of safety;Decreased awareness of deficits Awareness: Emergent Problem Solving: Slow processing;Decreased initiation;Difficulty sequencing;Requires verbal cues;Requires tactile cues General Comments: Pt with cognitive deficits at baseline; follows cues with increased time and encouragement; pt minimally responsive with therapist this session      Exercises General Exercises - Lower Extremity Heel Slides: AAROM;5 reps;Both Hip ABduction/ADduction: 5 reps;Both;AAROM Straight Leg Raises: AAROM;Both;5 reps Other Exercises Other Exercises: ankle pumps and toe wiggles bilaterally Other Exercises: 1/4 range Long Arc quads in sitting at EOB (max cues and encouragement)    General Comments        Pertinent Vitals/Pain Pain Assessment: No/denies pain    Home Living                      Prior Function            PT Goals (current goals can now be found in the care plan section) Acute Rehab PT Goals Patient Stated Goal: none stated PT Goal Formulation: With patient Time For Goal Achievement: 08/09/17 Potential to Achieve Goals: Poor    Frequency    Min 2X/week      PT Plan      Co-evaluation  AM-PAC PT "6 Clicks" Daily Activity  Outcome Measure  Difficulty turning over in bed (including adjusting bedclothes, sheets and blankets)?: Unable Difficulty moving from lying on back to sitting on the side of the bed? : Unable Difficulty sitting down on and standing up from a chair with arms (e.g., wheelchair, bedside commode, etc,.)?: Unable Help needed moving to and from a bed to chair (including a wheelchair)?: Total Help needed walking in hospital room?: Total Help needed climbing 3-5 steps with a railing? : Total 6 Click Score: 6     End of Session   Activity Tolerance: Patient limited by fatigue Patient left: in bed;with call bell/phone within reach;with nursing/sitter in room Nurse Communication: Mobility status PT Visit Diagnosis: Unsteadiness on feet (R26.81);Repeated falls (R29.6);Muscle weakness (generalized) (M62.81);Difficulty in walking, not elsewhere classified (R26.2);Hemiplegia and hemiparesis Hemiplegia - Right/Left: Right Hemiplegia - dominant/non-dominant: Dominant Hemiplegia - caused by: Unspecified     Time: 1214-1228 PT Time Calculation (min) (ACUTE ONLY): 14 min  Charges:  $Therapeutic Activity: 8-22 mins                    G Codes:       Charlotte Crumbevon Manisha Cancel, PT DPT  Board Certified Neurologic Specialist [phone]    Fabio AsaDevon J Jill Stopka 08/03/2017, 12:30 PM

## 2017-08-03 NOTE — Clinical Social Work Note (Signed)
CSW called [facility] to make the referral--left voicemail, awaiting call back.   LeesburgBridget Caetano [name], ConnecticutLCSWA [phone]

## 2017-08-04 DIAGNOSIS — I10 Essential (primary) hypertension: Secondary | ICD-10-CM | POA: Diagnosis not present

## 2017-08-04 DIAGNOSIS — F061 Catatonic disorder due to known physiological condition: Secondary | ICD-10-CM | POA: Diagnosis not present

## 2017-08-04 DIAGNOSIS — F4321 Adjustment disorder with depressed mood: Secondary | ICD-10-CM | POA: Diagnosis not present

## 2017-08-04 DIAGNOSIS — R945 Abnormal results of liver function studies: Secondary | ICD-10-CM | POA: Diagnosis not present

## 2017-08-04 LAB — GLUCOSE, CAPILLARY: GLUCOSE-CAPILLARY: 102 mg/dL — AB (ref 65–99)

## 2017-08-04 LAB — URINE CULTURE

## 2017-08-04 NOTE — Plan of Care (Signed)
  Problem: Education: Goal: Knowledge of disease or condition will improve Outcome: Progressing Goal: Knowledge of secondary prevention will improve Outcome: Progressing Goal: Knowledge of patient specific risk factors addressed and post discharge goals established will improve Outcome: Progressing   

## 2017-08-04 NOTE — Progress Notes (Signed)
Recommendations are for inpatient psychiatric admission. [place] [facility] [place] [place] and [facility] not have available beds at this time. Due to this information and after speaking to [name], [name], pt faxed out to other psychiatric facilities in [place].  [facility] be [facility] and returned in case they have a bed become available. CM received forms via email and will fax them back to number on forms. CM following.

## 2017-08-04 NOTE — Progress Notes (Signed)
PROGRESS NOTE  Sherri [name]  WUJ:[id number] DOB: [birth year] DOA: 07/25/2017 [facility]: [facility] Brief Narrative: Sherri [name] is a 62 y.o. female with a history of bipolar 1 disorder, schizophrenia with recent [facility] admission 2/5-3/20 for worsening psychosis and paranoia, HTN, pre-diabetes who presented to the ED from ALF with generalized weakness/frequent falls and right sided weakness with decreased speaking, though her minimal engagement makes history very limited. Initial evaluation included a negative CT of the head though code stroke was called and she was transferred to [facility] where weakness had begun to resolve, noted to have been moving all extremities without focal deficit. Also noted was pyuria on urinalysis, and the patient was admitted for weakness and presumed UTI on ceftriaxone. The patient refused MRI, was started on aspirin 325mg , lipitor 40mg  per neurology, and   Psychiatry evaluated the patient, feeling depressed mood was due to adjustment disorder and recommended increase of home haldol 5mg  > 10mg  qHS in addition to monthly depot haldol and home medications. She has not been taking medications, and has a history of this at previous psychiatric hospitalization. Due to worsening catatonia and suicidal ideation, psychiatry was reconsulted on 4/5 recommending ativan, 1:1 bedside sitter and inpatient psychiatric hospitalization given her high risk of harm to self. Also recommended IVC if pt attempted to leave AMA or refused psychiatric hospitalization.  Assessment & Plan: Principal Problem:   Catatonia Active Problems:   Right sided weakness   UTI (urinary tract infection)   Abnormal liver function   TIA (transient ischemic attack)   Adjustment disorder with depressed mood   Benign essential HTN   Leucocytosis  Catatonia, suicidal ideation, history of schizophrenia, bipolar 1 disorder: Pt with psychomotor retardation, rigidity with waxy flexibility not felt to be consistent  with organic neurological lesion. - Psychiatry recommended ativan challenge, ordered, and bedside sitter until inpatient psychiatry hospitalization can be arranged.  - Continue Trazodone 50 mg qhs PRN for insomnia. - Continue Atarax 25 mg q 8 hours PRN for anxiety. - Continue home Haldol 5 mg daily and 10 mg qhs for psychosis.  - Continue monthly Haldol Decanoate 100 mg (last received on 07/09/17 and next due on 08/08/17). QTc .  Right-sided weakness: Not cooperative with much of exam, instead states "I just want to die." Previous providers and sitters have reported significant discrepancies in movement of limbs. Work up not consistent with CVA or cord pathology.  - Monitor, continue PT efforts  Possible UTI: +Pyuria on dirty catch urine, treated with ceftriaxone x3 days. No culture sent. Remains afebrile.  - Continues to have mild leukocytosis. UA strongly suggestive of UTI with noncloncal culture. Resend culture and treat as indicated.  T2DM: HbA1c 6.4% indicating good chronic control. Has remained at inpatient goal with very little insulin given.  - qAM CBG has been stable. Will stop checks  Abnormal LFTs: Very mild with AST>ALT suggestive of medication. Hepatic steatosis on suggested by U/S. Hepatitis panel negative, HIV negative. Resolved.  Hypertension:  - Continue losartan, metoprolol and monitor, Continues to be on upper limit of normal, but not indicating change in therapy.  DVT prophylaxis: Lovenox Code Status: Full Family Communication: None available Disposition Plan: Inpatient psychiatric hospital when bed available. Pt is medically stable to transfer.  Consultants: Psychiatry, neurology Procedures: None Antimicrobials: Ceftriaxone 3/29 - 3/31  Subjective: Only opens eyes when asked, but per report has spoken with brother on phone, taken water and a few bites of food. Taking medications.   Objective: BP 102/64 (BP Location: Right  Arm)   Pulse 70   Temp 97.8  F (36.6 C) (Axillary)   Resp 17   Ht 5\' 2"  (1.575 m)   Wt 61 kg (134 lb 7.7 oz)   SpO2 96%   BMI 24.60 kg/m   Gen: 62 y.o. female laying on left side resting quietly. Pulm: Non -labored, clear CV: RRR, no m/r/g/JVD/edema GI: Soft, no distention or grimace with palpation. +BS. Ext: Warm, no deformities Skin: No rashes, lesions or ulcers Neuro: Alert, cannot evaluate orientation. Waxy flexibility of upper and lower extremities with muscle engagement in distal and proximal musculature. Otherwise not cooperative with exam.  Psych: Restricted affect, depressed mood.   CBG: Recent Labs  Lab 08/02/17 0624 08/02/17 1128 08/02/17 2145 08/03/17 0558 08/04/17 0615  GLUCAP 97 129* 103* 116* 102*   Time spent: 25 minutes.  Hazeline Junkeryan Sachiko Methot, MD [facility] Pager [phone]  If 7PM-7AM, please contact night-coverage [email or web address] Password [facility] 08/04/2017, 3:08 PM

## 2017-08-05 DIAGNOSIS — R945 Abnormal results of liver function studies: Secondary | ICD-10-CM | POA: Diagnosis not present

## 2017-08-05 DIAGNOSIS — F061 Catatonic disorder due to known physiological condition: Secondary | ICD-10-CM | POA: Diagnosis not present

## 2017-08-05 DIAGNOSIS — F4321 Adjustment disorder with depressed mood: Secondary | ICD-10-CM | POA: Diagnosis not present

## 2017-08-05 DIAGNOSIS — I10 Essential (primary) hypertension: Secondary | ICD-10-CM | POA: Diagnosis not present

## 2017-08-05 LAB — GLUCOSE, CAPILLARY: Glucose-Capillary: 117 mg/dL — ABNORMAL HIGH (ref 65–99)

## 2017-08-05 MED ORDER — DEXTROSE-NACL 5-0.45 % IV SOLN
INTRAVENOUS | Status: DC
  Administered 2017-08-05: 16:00:00 via INTRAVENOUS
  Filled 2017-08-05 (×3): qty 1000

## 2017-08-05 NOTE — Progress Notes (Signed)
Initial Nutrition Assessment  DOCUMENTATION CODES:   Not applicable  INTERVENTION:  Ensure Enlive po BID, each supplement provides 350 kcal and 20 grams of protein  Monitor for needs and PO intake  Recommend obtain new weight  NUTRITION DIAGNOSIS:   Inadequate oral intake related to poor appetite, social / environmental circumstances as evidenced by meal completion < 50%.  GOAL:   Patient will meet greater than or equal to 90% of their needs  MONITOR:   PO intake, Weight trends, Supplement acceptance  REASON FOR ASSESSMENT:   LOS    ASSESSMENT:   Sherri [name] is a 62 y.o. female with a history of bipolar 1 disorder, schizophrenia [facility] for worsening psychosis and paranoia, HTN, pre-diabetes who presented to the ED from ALF with generalized weakness/frequent falls and right sided weakness with decreased speaking, psychiatry evaluated her, found to have depressed mood due to adjustment disorder. Catatonia worsened at one point but seems to have improved some today.   Spoke with Sherri [name] at bedside. She provides some history. Gives a UBW of 130 pounds, unsure of weight loss. PTA she said she was eating 3 light meals a day, normally eating eggs, toast and milk for breakfast, and "anything" for lunch and dinner, normally eating 100%. Comes from [facility] where meals are provided for her. Reports not being able to walk anymore, R-sided weakness noted but she did not want cooperate with MD exam. Per sitter she has been eating very poorly since admit. Documented meal completion 50 and 20 at last 2 meals but otherwise, ate nothing. Did not eat breakfast. Says her appetite became poor after she fell.  Labs reviewed Medications reviewed and include:  Metformin  NUTRITION - FOCUSED PHYSICAL EXAM:    Most Recent Value  Orbital Region  No depletion  Upper Arm Region  No depletion  Thoracic and Lumbar Region  No depletion  Buccal  Region  No depletion  Temple Region  No depletion  Clavicle Bone Region  No depletion  Clavicle and Acromion Bone Region  No depletion  Scapular Bone Region  No depletion  Dorsal Hand  No depletion  Patellar Region  Mild depletion  Anterior Thigh Region  No depletion  Posterior Calf Region  No depletion  Edema (RD Assessment)  None       Diet Order:  Fall precautions Diet heart healthy/carb modified Room service appropriate? Yes; Fluid consistency: Thin Diet - low sodium heart healthy  EDUCATION NEEDS:   Not appropriate for education at this time  Skin:  Skin Assessment: Reviewed RN Assessment  Last BM:  07/31/2017  Height:   Ht Readings from Last 1 Encounters:  07/25/17 5\' 2"  (1.575 m)    Weight:   Wt Readings from Last 1 Encounters:  08/01/17 134 lb 7.7 oz (61 kg)    Ideal Body Weight:  50 kg  BMI:  Body mass index is 24.6 kg/m.  Estimated Nutritional Needs:   Kcal:  1353-1464 calories (MSJ x1.2-1.3)  Protein:  73-91 grams (1.2-1.5g/kg)  Fluid:  >1.5L  Dionne AnoWilliam M. Daniell Paradise, MS, RD LDN Inpatient Clinical Dietitian Pager [phone]

## 2017-08-05 NOTE — Progress Notes (Signed)
PROGRESS NOTE  Sherri [name]  ZOX:[id number] DOB: 01-22-1956 DOA: 07/25/2017 [facility] Narrative: Sherri [name] is a 62 y.o. female with a history of bipolar 1 disorder, schizophrenia with recent [facility] admission 2/5-3/20 for worsening psychosis and paranoia, HTN, pre-diabetes who presented to the ED from ALF with generalized weakness/frequent falls and right sided weakness with decreased speaking, though her minimal engagement makes history very limited. Initial evaluation included a negative CT of the head though code stroke was called and she was transferred to [facility] where weakness had begun to resolve, noted to have been moving all extremities without focal deficit. Also noted was pyuria on urinalysis, and the patient was admitted for weakness and presumed UTI on ceftriaxone. The patient refused MRI, was started on aspirin 325mg , lipitor 40mg  per neurology, and   Psychiatry evaluated the patient, feeling depressed mood was due to adjustment disorder and recommended increase of home haldol 5mg  > 10mg  qHS in addition to monthly depot haldol and home medications. She has not been taking medications, and has a history of this at previous psychiatric hospitalization. Due to worsening catatonia and suicidal ideation, psychiatry was reconsulted on 4/5 recommending ativan, 1:1 bedside sitter and inpatient psychiatric hospitalization given her high risk of harm to self. Also recommended IVC if pt attempted to leave AMA or refused psychiatric hospitalization.  Assessment & Plan: Principal Problem:   Catatonia Active Problems:   Right sided weakness   UTI (urinary tract infection)   Abnormal liver function   TIA (transient ischemic attack)   Adjustment disorder with depressed mood   Benign essential HTN   Leucocytosis  Catatonia, suicidal ideation, history of schizophrenia, bipolar 1 disorder: Pt with psychomotor retardation, rigidity with waxy flexibility not felt to be consistent  with organic neurological lesion. - Psychiatry recommended ativan and bedside sitter until inpatient psychiatry hospitalization can be arranged.  - Continue Trazodone 50 mg qhs PRN for insomnia. - Continue Atarax 25 mg q 8 hours PRN for anxiety. - Continue home Haldol 5 mg daily and 10 mg qhs for psychosis.  - Continue monthly Haldol Decanoate 100 mg (last received on 07/09/17 and next due on 08/08/17). QTc .  Right-sided weakness: Not cooperative with much of exam, instead states "I just want to die." Previous providers and sitters have reported significant discrepancies in movement of limbs. Work up not consistent with CVA or cord pathology.  - Monitor, continue PT efforts  Possible UTI: +Pyuria on dirty catch urine, treated with ceftriaxone x3 days. No culture sent. Remains afebrile.  - Continues to have mild leukocytosis so UA rechecked +pyuria again without fever and polyclonal culture. Therefore, repeating culture and will treat if positive or if becomes febrile.  T2DM: HbA1c 6.4% indicating good chronic control. Has remained at inpatient goal with very little insulin given.  - At inpatient goal.  Abnormal LFTs: Very mild with AST>ALT suggestive of medication. Hepatic steatosis on suggested by U/S. Hepatitis panel negative, HIV negative. Resolved.  Hypertension:  - Continue losartan, metoprolol and monitor. Controlled.  DVT prophylaxis: Lovenox Code Status: Full Family Communication: None available Disposition Plan: Inpatient psychiatric hospital when bed available. Pt is medically stable to transfer. [facility] Consultants: Psychiatry, neurology Procedures: None Antimicrobials: Ceftriaxone 3/29 - 3/31  Subjective: Opened eyes more spontaneously this morning, but not eating much. Is drinking water. Only spontaneous language is yes/no or shaking head. No changes.   Objective: BP 134/61 (BP Location: Right Arm)   Pulse 67   Temp 97.8  F (36.6  C) (Oral)   Resp 16   Ht 5\' 2"  (1.575 m)   Wt 61 kg (134 lb 7.7 oz)   SpO2 97%   BMI 24.60 kg/m   Gen: 62 y.o. female laying on her back quietly in no distress. Sitter at bedside. Pulm: Not labored, clear bilaterally CV: RRR, no m/r/g/JVD/edema GI: Soft, no distention or grimace with palpation. +BS. Ext: Warm, no deformities Skin: No rashes, lesions or ulcers Neuro: Alert, oriented to person. Suspect oriented to situation though cannot confirm this. Waxy flexibility of upper and lower extremities. Visible muscle recruitment when moving arms and legs for patient. Psych: Keeps eyes closed. Restricted affect, depressed mood.   CBG: Recent Labs  Lab 08/02/17 1128 08/02/17 2145 08/03/17 0558 08/04/17 0615 08/05/17 0622  GLUCAP 129* 103* 116* 102* 117*   Time spent: 25 minutes.  Sherri Junkeryan Grunz, MD [facility] Pager [phone]  If 7PM-7AM, please contact night-coverage [email or web address] Password [email or web address] 08/05/2017, 12:23 PM

## 2017-08-06 DIAGNOSIS — F061 Catatonic disorder due to known physiological condition: Secondary | ICD-10-CM | POA: Diagnosis not present

## 2017-08-06 LAB — BASIC METABOLIC PANEL
ANION GAP: 10 (ref 5–15)
BUN: 19 mg/dL (ref 6–20)
CALCIUM: 8.7 mg/dL — AB (ref 8.9–10.3)
CO2: 22 mmol/L (ref 22–32)
CREATININE: 0.76 mg/dL (ref 0.44–1.00)
Chloride: 103 mmol/L (ref 101–111)
GFR calc Af Amer: >60 mL/min (ref >60)
GLUCOSE: 134 mg/dL — AB (ref 65–99)
Potassium: 3.8 mmol/L (ref 3.5–5.1)
Sodium: 135 mmol/L (ref 135–145)

## 2017-08-06 LAB — URINE CULTURE: Culture: 50000 — AB

## 2017-08-06 MED ORDER — LORAZEPAM 1 MG PO TABS
1.0000 mg | ORAL_TABLET | Freq: Two times a day (BID) | ORAL | Status: DC
  Administered 2017-08-07 (×2): 1 mg via ORAL
  Filled 2017-08-06 (×2): qty 1

## 2017-08-06 MED ORDER — KCL IN DEXTROSE-NACL 10-5-0.45 MEQ/L-%-% IV SOLN
INTRAVENOUS | Status: DC
Start: 2017-08-06 — End: 2017-08-06
  Administered 2017-08-06: 04:00:00 via INTRAVENOUS
  Filled 2017-08-06 (×2): qty 1000

## 2017-08-06 NOTE — Care Management (Addendum)
Received phone call from [facility] late yesterday about the current medical needs for the patient. CM went over the [facility] she is [facility]. Per MD from [facility], he feels she may need a medical bed before psychiatric placement.  This am received request from Jacki ConesLaurie at the [facility] for more updated information on the patient. CM has attempted to fax to the number provided: [phone] without success. CM will continue to try and get information faxed to the [facility].  CM has spoken to Al, Pts brother, daily about progress of getting her transferred to a [facility] facility. He will be out of town the next 2 days but can be reached through his cousin Gunnar Fusiaula: [phone]. CM following.  Addendum:  Attempted to fax updated information to [facility] all day and unable to get fax to go through. CM called the [facility] and attempted to speak to the MD on call to give verbal updates without success. Information to be faxed again in the am.

## 2017-08-06 NOTE — Progress Notes (Signed)
PROGRESS NOTE    Sherri [name]  JXB:[id number] DOB: 1956-04-14 DOA: 07/25/2017 [facility]      Brief Narrative:  Sherri [name] is a 62 y.o. female with a history of bipolar 1 disorder, schizophrenia with [facility] for worsening psychosis and paranoia, HTN, pre-diabetes who presented to the ED from ALF with generalized weakness/frequent falls and right sided weakness with decreased speaking, though her minimal engagement makes history very limited. Initial evaluation included a negative CT of the head though code stroke was called and [facility] was [facility] where weakness had begun to resolve, noted to have been moving all extremities without focal deficit. Also noted was pyuria on urinalysis, and the patient was admitted for weakness and presumed UTI on ceftriaxone. The patient refused MRI, was started on aspirin 325mg , lipitor 40mg  per neurology, and   Psychiatry evaluated the patient, feeling depressed mood was due to adjustment disorder and recommended increase of home haldol 5mg  > 10mg  qHS in addition to monthly depot haldol and home medications. She has not been taking medications, and has a history of this at previous psychiatric hospitalization. Due to worsening catatonia and suicidal ideation, psychiatry was reconsulted on 4/5 recommending ativan, 1:1 bedside sitter and inpatient psychiatric hospitalization given her high risk of harm to self. Also recommended IVC if pt attempted to leave AMA or refused psychiatric hospitalization.     Assessment & Plan:  Catatonia, suicidal ideation, history of schizophrenia, bipolar 1 disorder:  -Continue trazodone, Atarax PRN -Continue Haldol oral BID  -I will defer IV depot shot to inpatient psychiatry -Taper lorazepam  Right-sided weakness:  Stroke ruled out with repeat CT head.   -I will discontinue aspirin and statin  Leukocyturia Clinical significance dubious  T2DM: -Continue  SSI -Continue metformin  Abnormal LFTs:  Hepatitis panel negative, HIV negative. Resolved.  Hypertension: Controlled -Continue losartan, metoprolol     DVT prophylaxis: SCDs Code Status: FULL Family Communication: none presnet MDM and disposition Plan: The below labs and imaging reports were reviewed.  The patient's status is clinically stable, now awaiting inpatient psychiatric hospitalization.     Consultants:   Neurology  Psychiatry     Subjective: No headache, chest pain, abdominal pain.  No new weakness, no numbness.  Otherwise does not cooperate with exam.  Objective: Vitals:   08/05/17 2003 08/06/17 0013 08/06/17 0402 08/06/17 0838  BP: 131/60 129/73 137/62 (!) 88/44  Pulse: 69 61 78 61  Resp: 20 20 18 17   Temp: 98 F (36.7 C) 97.8 F (36.6 C) 98.1 F (36.7 C) 98.3 F (36.8 C)  TempSrc: Oral Oral Oral Oral  SpO2: 98% 98% 98% 96%  Weight:      Height:        Intake/Output Summary (Last 24 hours) at 08/06/2017 0931 Last data filed at 08/06/2017 0300 Gross per 24 hour  Intake 898.67 ml  Output 900 ml  Net -1.33 ml   Filed Weights   07/25/17 2026 07/31/17 0424 08/01/17 0430  Weight: 60.3 kg (133 lb) 61.5 kg (135 lb 9.3 oz) 61 kg (134 lb 7.7 oz)    Examination: General appearance:  adult female, arouses to voice, keeps eyes closed, no obvious distress.  Appears catatonic. HEENT: Anicteric, conjunctiva pink, lids and lashes normal. No nasal deformity, discharge, epistaxis.  Lips dry, no oral lesions, dentition poor, OP tacky dry.  Hearing seems normal Skin: Warm and dry.  No jaundice.  No suspicious rashes or lesions. Cardiac: RRR, nl S1-S2, no  murmurs appreciated.  Capillary refill is brisk.  JVP normal.  No LE edema.  Radial pulses 2+ and symmetric. Respiratory: Normal respiratory rate and rhythm.  CTAB without rales or wheezes. Abdomen: Abdomen soft.  No TTP. No ascites, distension, hepatosplenomegaly.   MSK: No deformities or effusions. Neuro:  Awake, psychomotor slowing noted.  PERRL.  EOMI, moves all extremities with slow rigidity, not cogwheeling. Speech slow but fluent.    Psych: Sensorium intact and responding to questions very slowly, attention diminished. Affect catatonic.  Judgment and insight appear poor.    Data Reviewed: I have personally reviewed following labs and imaging studies:  CBC: Recent Labs  Lab 08/03/17 0824  WBC 12.5*  HGB 14.7  HCT 43.9  MCV 88.7  PLT 314   Basic Metabolic Panel: Recent Labs  Lab 08/03/17 0824 08/06/17 0545  NA 137 135  K 3.8 3.8  CL 103 103  CO2 22 22  GLUCOSE 114* 134*  BUN 30* 19  CREATININE 0.85 0.76  CALCIUM 9.4 8.7*   GFR: Estimated Creatinine Clearance: 62.7 mL/min (by C-G formula based on SCr of 0.76 mg/dL). Liver Function Tests: Recent Labs  Lab 08/03/17 0824  AST 27  ALT 39  ALKPHOS 86  BILITOT 0.9  PROT 6.9  ALBUMIN 3.6   No results for input(s): LIPASE, AMYLASE in the last 168 hours. No results for input(s): AMMONIA in the last 168 hours. Coagulation Profile: No results for input(s): INR, PROTIME in the last 168 hours. Cardiac Enzymes: No results for input(s): CKTOTAL, CKMB, CKMBINDEX, TROPONINI in the last 168 hours. BNP (last 3 results) No results for input(s): PROBNP in the last 8760 hours. HbA1C: No results for input(s): HGBA1C in the last 72 hours. CBG: Recent Labs  Lab 08/02/17 1128 08/02/17 2145 08/03/17 0558 08/04/17 0615 08/05/17 0622  GLUCAP 129* 103* 116* 102* 117*   Lipid Profile: No results for input(s): CHOL, HDL, LDLCALC, TRIG, CHOLHDL, LDLDIRECT in the last 72 hours. Thyroid Function Tests: No results for input(s): TSH, T4TOTAL, FREET4, T3FREE, THYROIDAB in the last 72 hours. Anemia Panel: No results for input(s): VITAMINB12, FOLATE, FERRITIN, TIBC, IRON, RETICCTPCT in the last 72 hours. Urine analysis:    Component Value Date/Time   COLORURINE YELLOW 08/03/2017 1746   APPEARANCEUR CLOUDY (A) 08/03/2017 1746    LABSPEC 1.023 08/03/2017 1746   PHURINE 5.0 08/03/2017 1746   GLUCOSEU NEGATIVE 08/03/2017 1746   HGBUR MODERATE (A) 08/03/2017 1746   BILIRUBINUR NEGATIVE 08/03/2017 1746   KETONESUR 20 (A) 08/03/2017 1746   PROTEINUR 30 (A) 08/03/2017 1746   NITRITE NEGATIVE 08/03/2017 1746   LEUKOCYTESUR LARGE (A) 08/03/2017 1746   Sepsis Labs: @LABRCNTIP (procalcitonin:4,lacticacidven:4)  ) Recent Results (from the past 240 hour(s))  Culture, Urine     Status: Abnormal   Collection Time: 08/03/17  4:42 PM  Result Value Ref Range Status   Specimen Description URINE, CATHETERIZED  Final   Special Requests   Final    NONE Performed at [facility], [address], [place], [place] [address]    Culture MULTIPLE SPECIES PRESENT, SUGGEST RECOLLECTION (A)  Final   Report Status 08/04/2017 FINAL  Final  Culture, Urine     Status: Abnormal   Collection Time: 08/04/17  4:47 PM  Result Value Ref Range Status   Specimen Description URINE, CATHETERIZED  Final   Special Requests   Final    NONE Performed at [facility], [address] [place], [place] [address]    Culture 50,000 COLONIES/mL  ENTEROCOCCUS FAECALIS (A)  Final   Report Status 08/06/2017 FINAL  Final   Organism ID, Bacteria ENTEROCOCCUS FAECALIS (A)  Final      Susceptibility   Enterococcus faecalis - MIC*    AMPICILLIN <=2 SENSITIVE Sensitive     LEVOFLOXACIN 0.5 SENSITIVE Sensitive     NITROFURANTOIN <=16 SENSITIVE Sensitive     VANCOMYCIN 2 SENSITIVE Sensitive     * 50,000 COLONIES/mL ENTEROCOCCUS FAECALIS         Radiology Studies: No results found.      Scheduled Meds: .  stroke: mapping our early stages of recovery book   Does not apply Once  . aspirin  300 mg Rectal Daily   Or  . aspirin  325 mg Oral Daily  . atorvastatin  40 mg Oral q1800  . haloperidol  10 mg Oral QHS  . haloperidol  5 mg Oral BH-q7a  . hydrOXYzine  25 mg Oral Q8H  . LORazepam  1 mg Oral TID  . losartan  100 mg Oral Daily  .  metFORMIN  500 mg Oral BID WC  . metoprolol tartrate  25 mg Oral BID   Continuous Infusions: . dextrose 5 % and 0.45 % NaCl with KCl 10 mEq/L 80 mL/hr at 08/06/17 0414     LOS: 12 days    Time spent: 30 minutes    Alberteen Sam, MD [facility] 08/06/2017, 9:31 AM     Pager [phone] --- please page though AMION:  [email or web address] Password [email or web address] If 7PM-7AM, please contact night-coverage

## 2017-08-06 NOTE — Progress Notes (Signed)
Physical Therapy Treatment Patient Details Name: Sherri [name] MRN: [id number] DOB: 1955-08-31 Today's Date: 08/06/2017    History of Present Illness Sherri BlalockMichelle [name]  is a 62 y.o. female, w Bipolar, Schizophrenis, hypertension, glucose intolerance, apparently c/o right sided weakness and inability to walk. CT negative    PT Comments    Pt lacking in participation this session. She required max A +2 for all bed mobilities. Able to sit EOB with mod A for ~10 min to perform LE AAROM. Multimodal cues to support torso through UEs. Pt repeating "I can't do this" and eyes closed throughout session. Updated d/c plan as pt unsafe to return home at this point.   Follow Up Recommendations  SNF     Equipment Recommendations  None recommended by PT    Recommendations for Other Services OT consult;Speech consult     Precautions / Restrictions Precautions Precautions: Fall Precaution Comments: contracture developmet Restrictions Weight Bearing Restrictions: No    Mobility  Bed Mobility Overal bed mobility: Needs Assistance Bed Mobility: Supine to Sit;Sit to Supine;Rolling Rolling: Max assist;+2 for physical assistance   Supine to sit: Max assist;+2 for physical assistance Sit to supine: Max assist;+2 for physical assistance   General bed mobility comments: max assist for all bed mobilities. Pt with vrey little participation and initiation of movement.   Transfers                 General transfer comment: Pt repetedly asking to return to bed. Not safe to attempt at this time as pt is lacking in participation  Ambulation/Gait                 Stairs            Wheelchair Mobility    Modified Rankin (Stroke Patients Only)       Balance Overall balance assessment: Needs assistance Sitting-balance support: No upper extremity supported;Bilateral upper extremity supported Sitting balance-Leahy Scale: Poor Sitting balance - Comments: pt with posterior lean  and mod A to maintain balance in sitting. Multi modal cues for WB through UE to balance. Pt sat EOB for ~10 min to perform AAROM Postural control: Posterior lean                                  Cognition Arousal/Alertness: Awake/alert Behavior During Therapy: Flat affect Overall Cognitive Status: Impaired/Different from baseline Area of Impairment: Memory;Following commands;Safety/judgement;Awareness;Problem solving;Attention                   Current Attention Level: Sustained Memory: Decreased short-term memory Following Commands: Follows one step commands with increased time Safety/Judgement: Decreased awareness of safety;Decreased awareness of deficits Awareness: Emergent Problem Solving: Slow processing;Decreased initiation;Difficulty sequencing;Requires verbal cues;Requires tactile cues General Comments: Pt with cognitive impairments at baseine. Pt minimally responsive and perseverating on "I can't do this".  Eyes closed throughout session.       Exercises General Exercises - Lower Extremity Long Arc Quad: AAROM;Both;10 reps;Seated    General Comments        Pertinent Vitals/Pain Pain Assessment: No/denies pain(Pt reports stiffness but no pain)    Home Living                      Prior Function            PT Goals (current goals can now be found in the care plan section) Acute Rehab PT Goals Patient  Stated Goal: none stated PT Goal Formulation: With patient Time For Goal Achievement: 08/09/17 Potential to Achieve Goals: Poor Progress towards PT goals: Not progressing toward goals - comment(limited participation)    Frequency    Min 2X/week      PT Plan Discharge plan needs to be updated    Co-evaluation              AM-PAC PT "6 Clicks" Daily Activity  Outcome Measure  Difficulty turning over in bed (including adjusting bedclothes, sheets and blankets)?: Unable Difficulty moving from lying on back to sitting on  the side of the bed? : Unable Difficulty sitting down on and standing up from a chair with arms (e.g., wheelchair, bedside commode, etc,.)?: Unable Help needed moving to and from a bed to chair (including a wheelchair)?: Total Help needed walking in hospital room?: Total Help needed climbing 3-5 steps with a railing? : Total 6 Click Score: 6    End of Session   Activity Tolerance: Patient limited by fatigue;Other (comment)(lack of participation) Patient left: in bed;with call bell/phone within reach;with nursing/sitter in room Nurse Communication: Mobility status PT Visit Diagnosis: Unsteadiness on feet (R26.81);Repeated falls (R29.6);Muscle weakness (generalized) (M62.81);Difficulty in walking, not elsewhere classified (R26.2);Hemiplegia and hemiparesis Hemiplegia - Right/Left: Right Hemiplegia - dominant/non-dominant: Dominant Hemiplegia - caused by: Unspecified     Time: 1610-9604 PT Time Calculation (min) (ACUTE ONLY): 17 min  Charges:  $Therapeutic Activity: 8-22 mins                    G Codes:       Sherri [name], [place] Pager [phone] Acute Rehab   Sherri [name] 08/06/2017, 10:51 AM

## 2017-08-07 DIAGNOSIS — R945 Abnormal results of liver function studies: Secondary | ICD-10-CM | POA: Diagnosis not present

## 2017-08-07 DIAGNOSIS — I10 Essential (primary) hypertension: Secondary | ICD-10-CM | POA: Diagnosis not present

## 2017-08-07 DIAGNOSIS — F061 Catatonic disorder due to known physiological condition: Secondary | ICD-10-CM | POA: Diagnosis not present

## 2017-08-07 MED ORDER — BISACODYL 10 MG RE SUPP
10.0000 mg | Freq: Once | RECTAL | Status: DC
  Filled 2017-08-07: qty 1

## 2017-08-07 MED ORDER — ENSURE ENLIVE PO LIQD
237.0000 mL | Freq: Two times a day (BID) | ORAL | Status: DC
  Administered 2017-08-09 – 2017-08-12 (×8): 237 mL via ORAL

## 2017-08-07 NOTE — Progress Notes (Signed)
PROGRESS NOTE    Sherri [name]  ZOX:[id number] DOB: 11-May-1955 DOA: 07/25/2017 [facility]: [facility]      Brief Narrative:  Sherri [name] is a 62 y.o. female with a history of bipolar 1 disorder, schizophrenia with recent [facility] admission 2/5-3/20 for worsening psychosis and paranoia, HTN, pre-diabetes who presented to the ED from ALF with generalized weakness/frequent falls and right sided weakness with decreased speaking, though her minimal engagement makes history very limited. Initial evaluation included a negative CT of the head though code stroke was called and she was transferred to [facility] where weakness had begun to resolve, noted to have been moving all extremities without focal deficit. Also noted was pyuria on urinalysis, and the patient was admitted for weakness and presumed UTI on ceftriaxone. The patient refused MRI, was started on aspirin 325mg , lipitor 40mg  per neurology, and   Psychiatry evaluated the patient, feeling depressed mood was due to adjustment disorder and recommended increase of home haldol 5mg  > 10mg  qHS in addition to monthly depot haldol and home medications. She has not been taking medications, and has a history of this at previous psychiatric hospitalization. Due to worsening catatonia and suicidal ideation, psychiatry was reconsulted on 4/5 recommending ativan, 1:1 bedside sitter and inpatient psychiatric hospitalization given her high risk of harm to self. Also recommended IVC if pt attempted to leave AMA or refused psychiatric hospitalization.     Assessment & Plan:  Catatonia, suicidal ideation, history of schizophrenia, bipolar 1 disorder:  -Continue trazodone, Atarax as needed -Continue Haldol twice daily, defer IV Depakote Haldol to inpatient psychiatry -Taper Lorazepam    Right-sided weakness:  Stroke ruled out with repeat CT head.  Bone and statin were discontinued  Leukocyturia Clinical significance dubious  T2DM: Well  controlled -Continue SSI, metformin  Abnormal LFTs:  Hepatitis panel negative, HIV negative. Resolved.  Hypertension: Controlled -Continue losartan, metoprolol     DVT prophylaxis: SCDs Code Status: FULL Family Communication: None present MDM and disposition Plan: The below labs and imaging reports were reviewed, summarized above.  The patient was admitted with catatonia, initially suspected to have a stroke which was ruled out.  She is now been recommended to undergo inpatient psychiatric treatment, and is awaiting placement.     Consultants:   Neurology  Psychiatry     Subjective: Denies fever, cough, headache, chest pain, abdominal pain, sputum, weakness, numbness.  Objective: Vitals:   08/06/17 1200 08/06/17 2051 08/07/17 0012 08/07/17 0428  BP: 140/73 (!) 127/58 (!) 129/46 129/61  Pulse: 68 84 70 77  Resp: 16 16 16 18   Temp: 98.4 F (36.9 C) 98.5 F (36.9 C) 98.6 F (37 C) 98.1 F (36.7 C)  TempSrc: Oral Oral Oral Oral  SpO2: 97% 97% 97% 100%  Weight:      Height:        Intake/Output Summary (Last 24 hours) at 08/07/2017 0732 Last data filed at 08/07/2017 0645 Gross per 24 hour  Intake 290 ml  Output 1325 ml  Net -1035 ml   Filed Weights   07/25/17 2026 07/31/17 0424 08/01/17 0430  Weight: 60.3 kg (133 lb) 61.5 kg (135 lb 9.3 oz) 61 kg (134 lb 7.7 oz)    Examination: General appearance:  Overweight adult female, lying in bed, eyes closed, catatonic.  No acute distress. HEENT: Lids and lashes normal, no nasal deformity, discharge, epistaxis, hearing normal, lips dry, whitish plaques on the tongue and pharynx, OP tacky dry. Skin: Skin is warm and dry, without jaundice or  rashes or lesions.,  No redness or induration or swelling or pain. Cardiac: Heart rate regular, no murmurs, no lower extremity edema. Respiratory: Respiratory effort normal, lungs clear without wheezes or rales. Abdomen: Abdomen soft without grimace to palpation, ascites,  distention, or hepatosplenomegaly. MSK: No deformities or effusions. Neuro: Awake with psychomotor slowing is noted.  Pupils are equal and reactive to light.  She moves all extremities with rigidity, no cogwheeling, speech slow and fluent.   Psych: Sensorium intact, nods to questions.  Affect catatonic.  Judgment and insight appear poor.  Attention diminished.    Data Reviewed: I have personally reviewed following labs and imaging studies:  CBC: Recent Labs  Lab 08/03/17 0824  WBC 12.5*  HGB 14.7  HCT 43.9  MCV 88.7  PLT 314   Basic Metabolic Panel: Recent Labs  Lab 08/03/17 0824 08/06/17 0545  NA 137 135  K 3.8 3.8  CL 103 103  CO2 22 22  GLUCOSE 114* 134*  BUN 30* 19  CREATININE 0.85 0.76  CALCIUM 9.4 8.7*   GFR: Estimated Creatinine Clearance: 62.7 mL/min (by C-G formula based on SCr of 0.76 mg/dL). Liver Function Tests: Recent Labs  Lab 08/03/17 0824  AST 27  ALT 39  ALKPHOS 86  BILITOT 0.9  PROT 6.9  ALBUMIN 3.6   No results for input(s): LIPASE, AMYLASE in the last 168 hours. No results for input(s): AMMONIA in the last 168 hours. Coagulation Profile: No results for input(s): INR, PROTIME in the last 168 hours. Cardiac Enzymes: No results for input(s): CKTOTAL, CKMB, CKMBINDEX, TROPONINI in the last 168 hours. BNP (last 3 results) No results for input(s): PROBNP in the last 8760 hours. HbA1C: No results for input(s): HGBA1C in the last 72 hours. CBG: Recent Labs  Lab 08/02/17 1128 08/02/17 2145 08/03/17 0558 08/04/17 0615 08/05/17 0622  GLUCAP 129* 103* 116* 102* 117*   Lipid Profile: No results for input(s): CHOL, HDL, LDLCALC, TRIG, CHOLHDL, LDLDIRECT in the last 72 hours. Thyroid Function Tests: No results for input(s): TSH, T4TOTAL, FREET4, T3FREE, THYROIDAB in the last 72 hours. Anemia Panel: No results for input(s): VITAMINB12, FOLATE, FERRITIN, TIBC, IRON, RETICCTPCT in the last 72 hours. Urine analysis:    Component Value  Date/Time   COLORURINE YELLOW 08/03/2017 1746   APPEARANCEUR CLOUDY (A) 08/03/2017 1746   LABSPEC 1.023 08/03/2017 1746   PHURINE 5.0 08/03/2017 1746   GLUCOSEU NEGATIVE 08/03/2017 1746   HGBUR MODERATE (A) 08/03/2017 1746   BILIRUBINUR NEGATIVE 08/03/2017 1746   KETONESUR 20 (A) 08/03/2017 1746   PROTEINUR 30 (A) 08/03/2017 1746   NITRITE NEGATIVE 08/03/2017 1746   LEUKOCYTESUR LARGE (A) 08/03/2017 1746   Sepsis Labs: @LABRCNTIP (procalcitonin:4,lacticacidven:4)  ) Recent Results (from the past 240 hour(s))  Culture, Urine     Status: Abnormal   Collection Time: 08/03/17  4:42 PM  Result Value Ref Range Status   Specimen Description URINE, CATHETERIZED  Final   Special Requests   Final    NONE Performed at [facility], [address] [place], [place] [address]    Culture MULTIPLE SPECIES PRESENT, SUGGEST RECOLLECTION (A)  Final   Report Status 08/04/2017 FINAL  Final  Culture, Urine     Status: Abnormal   Collection Time: 08/04/17  4:47 PM  Result Value Ref Range Status   Specimen Description URINE, CATHETERIZED  Final   Special Requests   Final    NONE Performed at [facility], [address] [place], [place] [address]  Culture 50,000 COLONIES/mL ENTEROCOCCUS FAECALIS (A)  Final   Report Status 08/06/2017 FINAL  Final   Organism ID, Bacteria ENTEROCOCCUS FAECALIS (A)  Final      Susceptibility   Enterococcus faecalis - MIC*    AMPICILLIN <=2 SENSITIVE Sensitive     LEVOFLOXACIN 0.5 SENSITIVE Sensitive     NITROFURANTOIN <=16 SENSITIVE Sensitive     VANCOMYCIN 2 SENSITIVE Sensitive     * 50,000 COLONIES/mL ENTEROCOCCUS FAECALIS         Radiology Studies: No results found.      Scheduled Meds: . haloperidol  10 mg Oral QHS  . haloperidol  5 mg Oral BH-q7a  . hydrOXYzine  25 mg Oral Q8H  . LORazepam  1 mg Oral BID  . losartan  100 mg Oral Daily  . metFORMIN  500 mg Oral BID WC  . metoprolol tartrate  25 mg Oral BID   Continuous  Infusions:    LOS: 13 days    Time spent: 20 minutes   Alberteen Sam, MD [facility] 08/07/2017, 7:32 AM     Pager [phone] --- please page though AMION:  [email or web address] Password [email or web address] If 7PM-7AM, please contact night-coverage

## 2017-08-07 NOTE — Progress Notes (Signed)
Occupational Therapy Treatment Patient Details Name: Sherri [name] MRN: [id number] DOB: [birth year] Today's Date: 08/07/2017    History of present illness Sherri BlalockMichelle [name]  is a 62 y.o. female, w Bipolar, Schizophrenis, hypertension, glucose intolerance, apparently c/o right sided weakness and inability to walk. CT negative   OT comments  pt required encouragement to sit EOB for activity. OT will continue to follow acutely  Follow Up Recommendations  SNF;Supervision/Assistance - 24 hour    Equipment Recommendations    TBD at net venue of care   Recommendations for Other Services      Precautions / Restrictions Precautions Precautions: Fall Precaution Comments: contracture developmet Restrictions Weight Bearing Restrictions: No       Mobility Bed Mobility Overal bed mobility: Needs Assistance Bed Mobility: Supine to Sit;Sit to Supine     Supine to sit: Max assist;+2 for physical assistance Sit to supine: Max assist;+2 for physical assistance   General bed mobility comments: max assist for all bed mobilities. Pt with vrey little participation and initiation of movement.   Transfers                 General transfer comment: pt refused    Balance Overall balance assessment: Needs assistance Sitting-balance support: No upper extremity supported;Bilateral upper extremity supported Sitting balance-Leahy Scale: Poor                                     ADL either performed or assessed with clinical judgement   ADL Overall ADL's : Needs assistance/impaired     Grooming: Wash/dry face;Wash/dry hands;Moderate assistance Grooming Details (indicate cue type and reason): Poor sitting balance and required max support sitting EOB with hand ver hand assiit to initiate Upper Body Bathing: Maximal assistance;Sitting Upper Body Bathing Details (indicate cue type and reason): siumulated                           General ADL Comments: pt  required encouragement to sit EOB for activity     Vision Patient Visual Report: No change from baseline     Perception     Praxis      Cognition Arousal/Alertness: Awake/alert Behavior During Therapy: Flat affect Overall Cognitive Status: Impaired/Different from baseline Area of Impairment: Memory;Following commands;Safety/judgement;Awareness;Problem solving;Attention                 Orientation Level: Disoriented to;Time   Memory: Decreased short-term memory Following Commands: Follows one step commands with increased time Safety/Judgement: Decreased awareness of safety;Decreased awareness of deficits   Problem Solving: Slow processing;Decreased initiation;Difficulty sequencing;Requires verbal cues;Requires tactile cues General Comments: Pt with cognitive impairments at baseine. Pt minimally responsive and perseverating on "I can't do this".  Eyes closed throughout session.         Exercises     Shoulder Instructions       General Comments      Pertinent Vitals/ Pain       Pain Assessment: 0-10 Pain Score: 7  Pain Location: LEs Pain Descriptors / Indicators: Aching;Sore Pain Intervention(s): Limited activity within patient's tolerance;Monitored during session;RN gave pain meds during session;Repositioned  Home Living                                          Prior Functioning/Environment  Frequency  Min 2X/week        Progress Toward Goals  OT Goals(current goals can now be found in the care plan section)  Progress towards OT goals: OT to reassess next treatment     Plan Discharge plan remains appropriate    Co-evaluation                 AM-PAC PT "6 Clicks" Daily Activity     Outcome Measure   Help from another person eating meals?: A Little Help from another person taking care of personal grooming?: A Lot Help from another person toileting, which includes using toliet, bedpan, or urinal?: A  Lot Help from another person bathing (including washing, rinsing, drying)?: A Lot Help from another person to put on and taking off regular upper body clothing?: A Lot Help from another person to put on and taking off regular lower body clothing?: A Lot 6 Click Score: 13    End of Session    OT Visit Diagnosis: Unsteadiness on feet (R26.81);Other abnormalities of gait and mobility (R26.89);Muscle weakness (generalized) (M62.81);Other symptoms and signs involving cognitive function   Activity Tolerance Patient limited by fatigue;Patient limited by pain   Patient Left in bed;with call bell/phone within reach;with bed alarm set;with nursing/sitter in room   Nurse Communication      Functional Assessment Tool Used: AM-PAC 6 Clicks Daily Activity   Time: 1610-9604 OT Time Calculation (min): 20 min  Charges: OT G-codes **NOT FOR INPATIENT CLASS** Functional Assessment Tool Used: AM-PAC 6 Clicks Daily Activity OT General Charges $OT Visit: 1 Visit OT Treatments $Therapeutic Activity: 8-22 mins     Galen Manila 08/07/2017, 12:50 PM

## 2017-08-07 NOTE — Progress Notes (Signed)
CS[facility] this morning. Patient remains on the waitlist, and has moved up a couple of spots due to discharges this week.  CSW spoke with Jacki [facility]Laurie [facility]; [facility] are still working to [facility] the [facility] [place]. CSW scanned and sent information to the [facility] that could not go through fax yesterday, due to the fax machine being down.   CSW spoke with April [facility]; they still do not have any open psych beds for the patient at this time.  CSW will continue to follow.  Blenda NicelyElizabeth Andrianna [place], [place] Clinical Social Worker [phone]

## 2017-08-08 DIAGNOSIS — R945 Abnormal results of liver function studies: Secondary | ICD-10-CM | POA: Diagnosis not present

## 2017-08-08 DIAGNOSIS — I10 Essential (primary) hypertension: Secondary | ICD-10-CM | POA: Diagnosis not present

## 2017-08-08 DIAGNOSIS — F061 Catatonic disorder due to known physiological condition: Secondary | ICD-10-CM | POA: Diagnosis not present

## 2017-08-08 LAB — GLUCOSE, CAPILLARY: GLUCOSE-CAPILLARY: 102 mg/dL — AB (ref 65–99)

## 2017-08-08 LAB — BASIC METABOLIC PANEL
Anion gap: 12 (ref 5–15)
BUN: 18 mg/dL (ref 6–20)
CALCIUM: 9 mg/dL (ref 8.9–10.3)
CO2: 19 mmol/L — ABNORMAL LOW (ref 22–32)
CREATININE: 1.69 mg/dL — AB (ref 0.44–1.00)
Chloride: 108 mmol/L (ref 101–111)
GFR, EST AFRICAN AMERICAN: 36 mL/min — AB (ref >60)
GFR, EST NON AFRICAN AMERICAN: 31 mL/min — AB (ref >60)
Glucose, Bld: 102 mg/dL — ABNORMAL HIGH (ref 65–99)
Potassium: 4.2 mmol/L (ref 3.5–5.1)
SODIUM: 139 mmol/L (ref 135–145)

## 2017-08-08 LAB — CBC
HCT: 41.6 % (ref 36.0–46.0)
Hemoglobin: 14 g/dL (ref 12.0–15.0)
MCH: 29.5 pg (ref 26.0–34.0)
MCHC: 33.7 g/dL (ref 30.0–36.0)
MCV: 87.6 fL (ref 78.0–100.0)
PLATELETS: 283 10*3/uL (ref 150–400)
RBC: 4.75 MIL/uL (ref 3.87–5.11)
RDW: 12.4 % (ref 11.5–15.5)
WBC: 10.1 10*3/uL (ref 4.0–10.5)

## 2017-08-08 LAB — LACTIC ACID, PLASMA: Lactic Acid, Venous: 1 mmol/L (ref 0.5–1.9)

## 2017-08-08 MED ORDER — LORAZEPAM 1 MG PO TABS
1.0000 mg | ORAL_TABLET | Freq: Every day | ORAL | Status: DC
  Administered 2017-08-08: 1 mg via ORAL
  Filled 2017-08-08: qty 1

## 2017-08-08 MED ORDER — LORAZEPAM 1 MG PO TABS
1.0000 mg | ORAL_TABLET | Freq: Three times a day (TID) | ORAL | Status: DC
  Administered 2017-08-08 – 2017-08-13 (×14): 1 mg via ORAL
  Filled 2017-08-08 (×15): qty 1

## 2017-08-08 MED ORDER — SODIUM CHLORIDE 0.9 % IV SOLN: 500.0000 mL | Freq: Once | INTRAVENOUS | Status: DC

## 2017-08-08 MED ORDER — SODIUM CHLORIDE 0.9 % IV SOLN: INTRAVENOUS | Status: DC

## 2017-08-08 MED ORDER — SODIUM CHLORIDE 0.9 % IV SOLN
INTRAVENOUS | Status: DC
  Administered 2017-08-08 – 2017-08-13 (×9): via INTRAVENOUS

## 2017-08-08 MED ORDER — FOSFOMYCIN TROMETHAMINE 3 G PO PACK
3.0000 g | PACK | Freq: Once | ORAL | Status: AC
  Administered 2017-08-08: 3 g via ORAL
  Filled 2017-08-08: qty 3

## 2017-08-08 MED ORDER — SODIUM CHLORIDE 0.9 % IV BOLUS
1000.0000 mL | Freq: Once | INTRAVENOUS | Status: AC
  Administered 2017-08-08: 1000 mL via INTRAVENOUS

## 2017-08-08 NOTE — Progress Notes (Signed)
Spoke with Fayrene FearingJames at the [facility] to setup transport. They do not have a bed for this patient yet. Marisue IvanLiz, CSW notified Dr. Maryfrances Bunnellanford.

## 2017-08-08 NOTE — Progress Notes (Signed)
PROGRESS NOTE    Sherri [name]  NFA:[id number] DOB: April 14, 1956 DOA: 07/25/2017 PCP: [facility]      Brief Narrative:  Sherri [name] is a 62 y.o. female with a history of bipolar 1 disorder, schizophrenia with recent [facility] admission 2/5-3/20 for worsening psychosis and paranoia, HTN, pre-diabetes who presented to the ED from ALF with generalized weakness/frequent falls and right sided weakness with decreased speaking, though her minimal engagement makes history very limited. Initial evaluation included a negative CT of the head though code stroke was called and she was transferred to [facility] where weakness had begun to resolve, noted to have been moving all extremities without focal deficit. Also noted was pyuria on urinalysis, and the patient was admitted for weakness and presumed UTI on ceftriaxone. The patient refused MRI, was started on aspirin 325mg , lipitor 40mg  per neurology, and   Psychiatry evaluated the patient, feeling depressed mood was due to adjustment disorder and recommended increase of home haldol 5mg  > 10mg  qHS in addition to monthly depot haldol and home medications. She has not been taking medications, and has a history of this at previous psychiatric hospitalization. Due to worsening catatonia and suicidal ideation, psychiatry was reconsulted on 4/5 recommending ativan, 1:1 bedside sitter and inpatient psychiatric hospitalization given her high risk of harm to self. Also recommended IVC if pt attempted to leave AMA or refused psychiatric hospitalization.     Assessment & Plan:  Catatonia, suicidal ideation, history of schizophrenia, bipolar 1 disorder:  -Continue trazodone, Atarax as needed -Continue Haldol twice daily, defer IV Depakote Haldol to inpatient psychiatry -Increase Lorazepam back to TID  Right-sided weakness:  Stroke ruled out with repeat CT head.  Aspirin and statin were discontinued  Leukocyturia Clinical significance dubious.     -Fosfomycin once  Hypotension -Check lactic acid, CBC, BMP  AKI From dehydration -Hold metformin, losartan  Dehydration Not eating due to catatonia.   -Start MIVF until demonstrating adequate PO intake  T2DM: Well controlled -Continue SSI  Abnormal LFTs:  Hepatitis panel negative, HIV negative. Resolved.  Hypertension: Controlled -Continue metoprolol     DVT prophylaxis: SCDs Code Status: FULL Family Communication: None present MDM and disposition Plan: The below labs and imaging reports were reviewed, summarized above.  The patient was admitted with catatonia, initially suspected to have a stroke which was ruled out.  She is now been recommended to undergo inpatient psychiatric treatment, and is awaiting placement.     Consultants:   Neurology  Psychiatry     Subjective: No new complaints.  Hypotensive this morning.  Has not been eating for days.  No new fever, cough, dysuria, sputum, urinary frequency, abdominal pain, chills, vomiting, confusion.    Objective: Vitals:   08/07/17 1636 08/07/17 1958 08/08/17 0000 08/08/17 0400  BP: (!) 147/71 (!) 145/66 125/65 (!) 146/80  Pulse: 81 82 64 70  Resp: 16 18 18 18   Temp: 98 F (36.7 C) 98 F (36.7 C)  98.4 F (36.9 C)  TempSrc: Oral Oral  Oral  SpO2: 98% 97% 90% 96%  Weight:      Height:        Intake/Output Summary (Last 24 hours) at 08/08/2017 0759 Last data filed at 08/07/2017 1933 Gross per 24 hour  Intake 240 ml  Output 500 ml  Net -260 ml   Filed Weights   07/25/17 2026 07/31/17 0424 08/01/17 0430  Weight: 60.3 kg (133 lb) 61.5 kg (135 lb 9.3 oz) 61 kg (134 lb 7.7 oz)  Examination: General appearance: Thin elderly female, lying in bed, eyes closed, catatonic, no acute distress, answers questions slowly.   HEENT: Lids and lashes normal, no nasal deformity, discharge, epistaxis, hearing normal, lips dry, whitish plaques on the tongue and pharynx, OP tacky dry. Skin: Skin is warm and dry,  without jaundice or rashes or lesions.,  No redness or induration or swelling or pain. Cardiac: Heart rate is regular, no murmurs, no lower extremity edema. Respiratory: Rate is even, lungs are clear without wheezes rales. Abdomen: Abdomen is soft without tenderness to palpation, ascites, distention, HSM. MSK: No deformities or effusions. Neuro: Awake with psychomotor slowing is noted.  Pupils are equal and reactive to light.  She moves all extremities with rigidity, no cogwheeling, speech slow and fluent.   Psych: Sensorium is intact, answers questions slowly, affect catatonic, judgment and insight appear poor, but intention diminished.    Data Reviewed: I have personally reviewed following labs and imaging studies:  CBC: Recent Labs  Lab 08/03/17 0824  WBC 12.5*  HGB 14.7  HCT 43.9  MCV 88.7  PLT 314   Basic Metabolic Panel: Recent Labs  Lab 08/03/17 0824 08/06/17 0545  NA 137 135  K 3.8 3.8  CL 103 103  CO2 22 22  GLUCOSE 114* 134*  BUN 30* 19  CREATININE 0.85 0.76  CALCIUM 9.4 8.7*   GFR: Estimated Creatinine Clearance: 62.7 mL/min (by C-G formula based on SCr of 0.76 mg/dL). Liver Function Tests: Recent Labs  Lab 08/03/17 0824  AST 27  ALT 39  ALKPHOS 86  BILITOT 0.9  PROT 6.9  ALBUMIN 3.6   No results for input(s): LIPASE, AMYLASE in the last 168 hours. No results for input(s): AMMONIA in the last 168 hours. Coagulation Profile: No results for input(s): INR, PROTIME in the last 168 hours. Cardiac Enzymes: No results for input(s): CKTOTAL, CKMB, CKMBINDEX, TROPONINI in the last 168 hours. BNP (last 3 results) No results for input(s): PROBNP in the last 8760 hours. HbA1C: No results for input(s): HGBA1C in the last 72 hours. CBG: Recent Labs  Lab 08/02/17 2145 08/03/17 0558 08/04/17 0615 08/05/17 0622 08/08/17 0641  GLUCAP 103* 116* 102* 117* 102*   Lipid Profile: No results for input(s): CHOL, HDL, LDLCALC, TRIG, CHOLHDL, LDLDIRECT in the  last 72 hours. Thyroid Function Tests: No results for input(s): TSH, T4TOTAL, FREET4, T3FREE, THYROIDAB in the last 72 hours. Anemia Panel: No results for input(s): VITAMINB12, FOLATE, FERRITIN, TIBC, IRON, RETICCTPCT in the last 72 hours. Urine analysis:    Component Value Date/Time   COLORURINE YELLOW 08/03/2017 1746   APPEARANCEUR CLOUDY (A) 08/03/2017 1746   LABSPEC 1.023 08/03/2017 1746   PHURINE 5.0 08/03/2017 1746   GLUCOSEU NEGATIVE 08/03/2017 1746   HGBUR MODERATE (A) 08/03/2017 1746   BILIRUBINUR NEGATIVE 08/03/2017 1746   KETONESUR 20 (A) 08/03/2017 1746   PROTEINUR 30 (A) 08/03/2017 1746   NITRITE NEGATIVE 08/03/2017 1746   LEUKOCYTESUR LARGE (A) 08/03/2017 1746   Sepsis Labs: @LABRCNTIP (procalcitonin:4,lacticacidven:4)  ) Recent Results (from the past 240 hour(s))  Culture, Urine     Status: Abnormal   Collection Time: 08/03/17  4:42 PM  Result Value Ref Range Status   Specimen Description URINE, CATHETERIZED  Final   Special Requests   Final    NONE Performed at [facility], [address], [place], [place] [address]    Culture MULTIPLE SPECIES PRESENT, SUGGEST RECOLLECTION (A)  Final   Report Status 08/04/2017 FINAL  Final  Culture, Urine  Status: Abnormal   Collection Time: 08/04/17  4:47 PM  Result Value Ref Range Status   Specimen Description URINE, CATHETERIZED  Final   Special Requests   Final    NONE Performed at [facility], [address] [place], [place] [address]    Culture 50,000 COLONIES/mL ENTEROCOCCUS FAECALIS (A)  Final   Report Status 08/06/2017 FINAL  Final   Organism ID, Bacteria ENTEROCOCCUS FAECALIS (A)  Final      Susceptibility   Enterococcus faecalis - MIC*    AMPICILLIN <=2 SENSITIVE Sensitive     LEVOFLOXACIN 0.5 SENSITIVE Sensitive     NITROFURANTOIN <=16 SENSITIVE Sensitive     VANCOMYCIN 2 SENSITIVE Sensitive     * 50,000 COLONIES/mL ENTEROCOCCUS FAECALIS         Radiology Studies: No results  found.      Scheduled Meds: . bisacodyl  10 mg Rectal Once  . feeding supplement (ENSURE ENLIVE)  237 mL Oral BID BM  . haloperidol  10 mg Oral QHS  . haloperidol  5 mg Oral BH-q7a  . hydrOXYzine  25 mg Oral Q8H  . LORazepam  1 mg Oral Daily  . losartan  100 mg Oral Daily  . metFORMIN  500 mg Oral BID WC  . metoprolol tartrate  25 mg Oral BID   Continuous Infusions:    LOS: 14 days    Time spent: 25 minutes   Alberteen Samhristopher P Mckynleigh Mussell, MD [facility] 08/08/2017, 7:59 AM     Pager [phone] --- please page though AMION:  [email or web address] If 7PM-7AM, please contact night-coverage

## 2017-08-08 NOTE — Progress Notes (Addendum)
CSW received fax from [facility] with another financial form that the [facility] will need in order to process patient's transfer request. CSW contacted patient's financial guardian, Sherri [name], to request that paperwork be completed and sent back. His office is closed until Monday, but paperwork was faxed to him and he will complete and send back on Monday morning.   CSW contacted Sherri [name] at the [facility] that the paperwork was received and sent to financial guardian, but he will not be able to complete it until Monday. Sherri [name] requested that clinical updates be sent again on Monday, when the financial paperwork is complete.   CSW will continue to follow.  Sherri [name], [place] Clinical Social Worker [phone]

## 2017-08-09 DIAGNOSIS — F061 Catatonic disorder due to known physiological condition: Secondary | ICD-10-CM | POA: Diagnosis not present

## 2017-08-09 LAB — BASIC METABOLIC PANEL
Anion gap: 9 (ref 5–15)
BUN: 21 mg/dL — AB (ref 6–20)
CHLORIDE: 111 mmol/L (ref 101–111)
CO2: 18 mmol/L — AB (ref 22–32)
Calcium: 8.5 mg/dL — ABNORMAL LOW (ref 8.9–10.3)
Creatinine, Ser: 1.53 mg/dL — ABNORMAL HIGH (ref 0.44–1.00)
GFR calc Af Amer: 41 mL/min — ABNORMAL LOW (ref >60)
GFR calc non Af Amer: 35 mL/min — ABNORMAL LOW (ref >60)
Glucose, Bld: 83 mg/dL (ref 65–99)
POTASSIUM: 3.8 mmol/L (ref 3.5–5.1)
Sodium: 138 mmol/L (ref 135–145)

## 2017-08-09 LAB — GLUCOSE, CAPILLARY
GLUCOSE-CAPILLARY: 76 mg/dL (ref 65–99)
Glucose-Capillary: 68 mg/dL (ref 65–99)

## 2017-08-09 MED ORDER — WHITE PETROLATUM EX OINT
TOPICAL_OINTMENT | CUTANEOUS | Status: AC
  Filled 2017-08-09: qty 28.35

## 2017-08-09 MED ORDER — ENSURE ENLIVE PO LIQD: 237.0000 mL | Freq: Two times a day (BID) | ORAL | 12 refills | Status: AC

## 2017-08-09 MED ORDER — LORAZEPAM 1 MG PO TABS: 1.0000 mg | ORAL_TABLET | Freq: Three times a day (TID) | ORAL | 0 refills | Status: AC

## 2017-08-09 NOTE — Discharge Summary (Signed)
Physician Discharge Summary  JAELYN BOURGOIN ZOX:[id number] DOB: 04-19-1956 DOA: 07/25/2017  PCP: [facility]  Admit date: 07/25/2017 Discharge date: 08/09/2017  Admitted From: Home  Disposition:  To [facility] Med[facility] for ultimate transfer to Inpatient Psychiatry     Home Health: N/A  Equipment/Devices: None  Discharge Condition: Fair  CODE STATUS: FULL Diet recommendation: Regular  Brief/Interim Summary: Sherri [name] is a 62 y.o. F with hx bipolar 1 disorder, schizophrenia with recent psychiatric admission in [facility] 2/5-3/20 for worsening psychosis and paranoia, HTN, and pre-diabetes who presented to the ED from ALF with generalized weakness, frequent falls and reported right sided weakness with decreased speaking.          Discharge Diagnoses:  Principal Problem:   Catatonia The patient initially appeared depressed and withdrawn.  Psychiatry evaluated the patient, feeling depressed mood was due to adjustment disorder and recommended increasing home haldol 5mg  > 10mg  qHS in addition to monthly depot haldol and home medications (last given 3/13, next due 4/14). She has not been taking medications, and has a history of this at previous psychiatric hospitalization.   While in the hospital, she had progression of her decreased mental status, appeared to be catatonic and was expressing suicidality.  Psychiatry was reconsulted on 4/5 and at that time recommended ativan, 1:1 bedside sitter and inpatient psychiatric hospitalization given her high risk of harm to self. Also recommended IVC if pt attempted to leave AMA or refused psychiatric hospitalization.      Right sided weakness Her lack of engagement made initial history taking very limited, and there was an initial concern for stroke.  CT head at admission on 3/29 was unremarkable, and her subsequent exam by Neurology was not indicative of a hemiparesis, but rather a generalized waxy stiffness.  The patient  refused MRI brain, but repeat CT head on 3/31 indicated no evolving stroke, and stroke was considered ruled out.  New aspirin and statin were stopped.    UTI (urinary tract infection) Patient had incidental leukocyturia noted.  She had no urinary symptoms.  Urine culture grew multiple species.  Repeat urine culture grew 50K enterococcus.  She was given fosfomycin empirically.    Abnormal liver function Noted to have mildly elevated LFTs at admission, 60s to 90s.  Hep serologies negative. RUQ [place] negative.  These resolved spontaneously.    AKI Baseline Cr 0.8.  On 4/11 she was noted to be hypotensive in the morning.  BP medicines were held, ARB was held, and she was given fluids.  Cr began to improve.  Patient had stopped eating prior to that about 4 days, and it was presumed that she had pre-renal azotemia from hypovolemia.    Benign essential HTN Losartan 100 mg stopped due to AKI.  Metoprolol 25 mg BID continued.        Discharge Instructions   Allergies as of 08/09/2017   No Known Allergies     Medication List    STOP taking these medications   losartan 100 MG tablet Commonly known as:  COZAAR     TAKE these medications   acetaminophen 500 MG tablet Commonly known as:  TYLENOL Take 2 tablets (1,000 mg total) by mouth every 8 (eight) hours as needed for mild pain.   calcium-vitamin D 500-200 MG-UNIT tablet Commonly known as:  OSCAL WITH D Take 1 tablet by mouth daily with breakfast. For bone health   feeding supplement (ENSURE ENLIVE) Liqd Take 237 mLs by mouth 2 (two) times daily between meals.  haloperidol 10 MG tablet Commonly known as:  HALDOL Take 1 tablet (10 mg total) by mouth at bedtime. For mood control What changed:  Another medication with the same name was changed. Make sure you understand how and when to take each.   haloperidol 5 MG tablet Commonly known as:  HALDOL Take 1 tablet (5 mg total) by mouth every morning. For mood control What changed:     when to take this  additional instructions   haloperidol decanoate 100 MG/ML injection Commonly known as:  HALDOL DECANOATE Inject 1 mL (100 mg total) into the muscle every 30 (thirty) days. (Due on 08-08-17): For mood control   hydrOXYzine 25 MG tablet Commonly known as:  ATARAX/VISTARIL Take 1 tablet (25 mg total) by mouth every 8 (eight) hours. For anxiety   LORazepam 1 MG tablet Commonly known as:  ATIVAN Take 1 tablet (1 mg total) by mouth 3 (three) times daily.   metFORMIN 500 MG tablet Commonly known as:  GLUCOPHAGE Take 1 tablet (500 mg total) by mouth 2 (two) times daily with a meal. For diabetes management   metoprolol tartrate 25 MG tablet Commonly known as:  LOPRESSOR Take 1 tablet (25 mg total) by mouth 2 (two) times daily.   traZODone 50 MG tablet Commonly known as:  DESYREL Take 1 tablet (50 mg total) by mouth at bedtime as needed for sleep.       No Known Allergies  Consultations:  Neurology  Psychiatry   Procedures/Studies: Ct Head Wo Contrast  Result Date: 07/27/2017 CLINICAL DATA:  Stroke follow-up EXAM: CT HEAD WITHOUT CONTRAST TECHNIQUE: Contiguous axial images were obtained from the base of the skull through the vertex without intravenous contrast. COMPARISON:  Two days ago.  Right-sided weakness FINDINGS: Brain: No evidence of infarction, hemorrhage, hydrocephalus, extra-axial collection or mass lesion/mass effect. Vascular: No hyperdense vessel or unexpected calcification. Skull: Normal. Negative for fracture or focal lesion. Sinuses/Orbits: No acute finding. IMPRESSION: Stable, negative head CT. Electronically Signed   By: Marnee Spring M.D.   On: 07/27/2017 12:07   Ct Head Code Stroke Wo Contrast  Result Date: 07/25/2017 CLINICAL DATA:  Code stroke.  Found on floor, right-sided weakness EXAM: CT HEAD WITHOUT CONTRAST TECHNIQUE: Contiguous axial images were obtained from the base of the skull through the vertex without intravenous  contrast. COMPARISON:  None. FINDINGS: Brain: No evidence of acute infarction, hemorrhage, hydrocephalus, extra-axial collection or mass lesion/mass effect. Vascular: Negative for hyperdense vessel Skull: Negative Sinuses/Orbits: Negative Other: None ASPECTS (Alberta Stroke Program Early CT Score) - Ganglionic level infarction (caudate, lentiform nuclei, internal capsule, insula, M1-M3 cortex): 7 - Supraganglionic infarction (M4-M6 cortex): 3 Total score (0-10 with 10 being normal): 10 IMPRESSION: 1. No acute intracranial abnormality 2. ASPECTS is 10 3. These results were called by telephone at the time of interpretation on 07/25/2017 at 11:24 am to Dr. Laurence Slate , who verbally acknowledged these results. Electronically Signed   By: Marlan Palau M.D.   On: 07/25/2017 11:25   US Abdomen Limited Ruq  Result Date: 07/25/2017 CLINICAL DATA:  Increased LFT EXAM: ULTRASOUND ABDOMEN LIMITED RIGHT UPPER QUADRANT COMPARISON:  None. FINDINGS: Gallbladder: No gallstones or wall thickening visualized. No sonographic Murphy sign noted by sonographer. Common bile duct: Diameter: 4.9 mm Liver: Increased echogenicity. No focal hepatic abnormality. Portal vein is patent on color Doppler imaging with normal direction of blood flow towards the liver. Incidental note made of cysts in the mid right kidney measuring up to 2.2 cm. IMPRESSION: 1. Negative  for gallstones or biliary dilatation 2. Increased hepatic echogenicity consistent with steatosis and or hepatocellular disease. Electronically Signed   By: Jasmine Pang M.D.   On: 07/25/2017 23:40      Subjective: No headache, chest pain, abdominal pain.  No cough, sputum, dysuria.  No nausea.  Denies everything except complains of leg soreness bilaterally, whole legs.  Discharge Exam: Vitals:   08/09/17 0412 08/09/17 0740  BP: 136/72 (!) 120/48  Pulse: 78 70  Resp: 20 20  Temp: 98 F (36.7 C) 98 F (36.7 C)  SpO2: 98% 98%   Vitals:   08/08/17 1938 08/08/17 2340  08/09/17 0412 08/09/17 0740  BP: (!) 115/56 (!) 100/55 136/72 (!) 120/48  Pulse: 77 70 78 70  Resp: 20 20 20 20   Temp: 98.5 F (36.9 C) 98.3 F (36.8 C) 98 F (36.7 C) 98 F (36.7 C)  TempSrc: Oral Oral Axillary Oral  SpO2: 97% 99% 98% 98%  Weight:      Height:        General: Pt is awake, responsive, but has severe psychomotor slowing.   Cardiovascular: RRR, S1/S2 +, no rubs, no gallops Respiratory: CTA bilaterally, no wheezing, no rhonchi Abdominal: Soft, NT, ND, bowel sounds + Extremities: no edema, no cyanosis, distal DP pulses equal and good.  Feet warm, no redness or rash on either leg. Neuro: Patient is stiff in bilateral arms, somewhat contracted.  Does not follow motor commands, but opens mouth.  Follows simple commands.  Severe psychomotor slowing. Psych: Appears catatonic, oriented to person, place and time.    The results of significant diagnostics from this hospitalization (including imaging, microbiology, ancillary and laboratory) are listed below for reference.       Labs:  Basic Metabolic Panel: Recent Labs  Lab 08/03/17 0824 08/06/17 0545 08/08/17 1103 08/09/17 0808  NA 137 135 139 138  K 3.8 3.8 4.2 3.8  CL 103 103 108 111  CO2 22 22 19* 18*  GLUCOSE 114* 134* 102* 83  BUN 30* 19 18 21*  CREATININE 0.85 0.76 1.69* 1.53*  CALCIUM 9.4 8.7* 9.0 8.5*   Liver Function Tests: Recent Labs  Lab 08/03/17 0824  AST 27  ALT 39  ALKPHOS 86  BILITOT 0.9  PROT 6.9  ALBUMIN 3.6   No results for input(s): LIPASE, AMYLASE in the last 168 hours. No results for input(s): AMMONIA in the last 168 hours. CBC: Recent Labs  Lab 08/03/17 0824 08/08/17 1103  WBC 12.5* 10.1  HGB 14.7 14.0  HCT 43.9 41.6  MCV 88.7 87.6  PLT 314 283    CBG: Recent Labs  Lab 08/04/17 0615 08/05/17 0622 08/08/17 0641 08/09/17 0623 08/09/17 0737  GLUCAP 102* 117* 102* 76 68     Urinalysis    Component Value Date/Time   COLORURINE YELLOW 08/03/2017 1746    APPEARANCEUR CLOUDY (A) 08/03/2017 1746   LABSPEC 1.023 08/03/2017 1746   PHURINE 5.0 08/03/2017 1746   GLUCOSEU NEGATIVE 08/03/2017 1746   HGBUR MODERATE (A) 08/03/2017 1746   BILIRUBINUR NEGATIVE 08/03/2017 1746   KETONESUR 20 (A) 08/03/2017 1746   PROTEINUR 30 (A) 08/03/2017 1746   NITRITE NEGATIVE 08/03/2017 1746   LEUKOCYTESUR LARGE (A) 08/03/2017 1746    Microbiology Recent Results (from the past 240 hour(s))  Culture, Urine     Status: Abnormal   Collection Time: 08/03/17  4:42 PM  Result Value Ref Range Status   Specimen Description URINE, CATHETERIZED  Final   Special Requests   Final  NONE Performed at [facility], [address] [place], [place] [address]    Culture MULTIPLE SPECIES PRESENT, SUGGEST RECOLLECTION (A)  Final   Report Status 08/04/2017 FINAL  Final  Culture, Urine     Status: Abnormal   Collection Time: 08/04/17  4:47 PM  Result Value Ref Range Status   Specimen Description URINE, CATHETERIZED  Final   Special Requests   Final    NONE Performed at [facility], [address] [place], [place] [address]    Culture 50,000 COLONIES/mL ENTEROCOCCUS FAECALIS (A)  Final   Report Status 08/06/2017 FINAL  Final   Organism ID, Bacteria ENTEROCOCCUS FAECALIS (A)  Final      Susceptibility   Enterococcus faecalis - MIC*    AMPICILLIN <=2 SENSITIVE Sensitive     LEVOFLOXACIN 0.5 SENSITIVE Sensitive     NITROFURANTOIN <=16 SENSITIVE Sensitive     VANCOMYCIN 2 SENSITIVE Sensitive     * 50,000 COLONIES/mL ENTEROCOCCUS FAECALIS     Time coordinating discharge: 35 minutes  SIGNED:   Alberteen Samhristopher P Danford, MD  [facility] 08/09/2017, 11:44 AM

## 2017-08-09 NOTE — Clinical Social Work Note (Signed)
CSW spoke with Laneta Simmersamisha [phone] ext: [phone]), social [facility] to [facility] if [facility] has a [facility] today. Tamisha states psych does not have a bed and in order for pt to be accepted into a med surge bed CSW would have to call back on Monday when Lauren with admissions is available.   [place] Hope[place]ridget Zoltan [place], [place] [phone]

## 2017-08-10 DIAGNOSIS — R945 Abnormal results of liver function studies: Secondary | ICD-10-CM | POA: Diagnosis not present

## 2017-08-10 DIAGNOSIS — I10 Essential (primary) hypertension: Secondary | ICD-10-CM | POA: Diagnosis not present

## 2017-08-10 DIAGNOSIS — F061 Catatonic disorder due to known physiological condition: Secondary | ICD-10-CM | POA: Diagnosis not present

## 2017-08-10 LAB — GLUCOSE, CAPILLARY
GLUCOSE-CAPILLARY: 87 mg/dL (ref 65–99)
GLUCOSE-CAPILLARY: 112 mg/dL — AB (ref 65–99)
GLUCOSE-CAPILLARY: 100 mg/dL — AB (ref 65–99)

## 2017-08-10 NOTE — Progress Notes (Signed)
PROGRESS NOTE    Sherri [name]  ZOX:[id number] DOB: April 27, 1956 DOA: 07/25/2017 PCP: [facility]      Brief Narrative:  Sherri [name] is a 62 y.o. female with a history of bipolar 1 disorder, schizophrenia with recent [facility] admission 2/5-3/20 for worsening psychosis and paranoia, HTN, pre-diabetes who presented to the ED from ALF with generalized weakness/frequent falls and right sided weakness with decreased speaking, though her minimal engagement makes history very limited. Initial evaluation included a negative CT of the head though code stroke was called and she was transferred to [facility] where weakness had begun to resolve, noted to have been moving all extremities without focal deficit. Also noted was pyuria on urinalysis, and the patient was admitted for weakness and presumed UTI on ceftriaxone. The patient refused MRI, was started on aspirin 325mg , lipitor 40mg  per neurology, and   Psychiatry evaluated the patient, feeling depressed mood was due to adjustment disorder and recommended increase of home haldol 5mg  > 10mg  qHS in addition to monthly depot haldol and home medications. She has not been taking medications, and has a history of this at previous psychiatric hospitalization. Due to worsening catatonia and suicidal ideation, psychiatry was reconsulted on 4/5 recommending ativan, 1:1 bedside sitter and inpatient psychiatric hospitalization given her high risk of harm to self. Also recommended IVC if pt attempted to leave AMA or refused psychiatric hospitalization.     Assessment & Plan:  Catatonia, suicidal ideation, history of schizophrenia, bipolar 1 disorder:  Actually some improvement in the last 2 days with increasing lorazepam again.   -Continue trazodone, Atarax as needed -Continue Haldol twice daily, defer IV Haldol to the inpatient psychiatrist -Augment lorazepam to 2 mg 3 times daily   Right-sided weakness:  Stroke ruled out with repeat CT head.   Aspirin and statin were discontinued  Leukocyturia Clinical significance dubious.  Fosfomycin given  Hypotension Dehydration From poor p.o. intake, refusing food over the last several days.  Resolved with IV fluids.  Now on maintenance fluids -Continue maintenance fluids  AKI From dehydration.  Improved somewhat yesterday after fluids, today patient refusing labs. -Hold metformin, losartan -Obtain BMP when able  T2DM: Well controlled -Continue sliding-scale  Abnormal LFTs: Hepatitis panel negative, HIV negative. Resolved.  Hypertension: Blood pressure low due to poor p.o. intake. -Hold metoprolol     DVT prophylaxis: SCDs Code Status: FULL Family Communication: None present MDM and disposition Plan: The below labs and imaging reports were reviewed, summarized above.  The patient had been admitted with catatonia, initially suspected to have a stroke which was ruled out.  She has now been recommended to go inpatient psychiatric treatment and is awaiting placement.  She is stable for discharge, discharge was to have taken place yesterday and she was was in fact discharged briefly, then this was canceled because the bed is not actually available.   Consultants:   Neurology  Psychiatry     Subjective: Patient has no new complaints.  No new hypotension, no fever, cough, dysuria, sputum production, urinary frequency, abdominal pain, chills, vomiting.  She asked me "can you help me die".  She states that she "can use my body, and I have no family left".  Objective: Vitals:   08/09/17 2332 08/10/17 0619 08/10/17 0821 08/10/17 1231  BP: (!) 164/77 (!) 140/55 (!) 130/54 (!) 156/72  Pulse: 97 87 66 93  Resp:   16 16  Temp:   98.7 F (37.1 C) 98.4 F (36.9 C)  TempSrc: Tympanic  Oral  SpO2: 98% 97% 100% 95%  Weight:      Height:        Intake/Output Summary (Last 24 hours) at 08/10/2017 1344 Last data filed at 08/10/2017 0900 Gross per 24 hour  Intake 560 ml    Output 1500 ml  Net -940 ml   Filed Weights   07/25/17 2026 07/31/17 0424 08/01/17 0430  Weight: 60.3 kg (133 lb) 61.5 kg (135 lb 9.3 oz) 61 kg (134 lb 7.7 oz)    Examination: General appearance: Adult female, appears older than stated age.  Lying in bed.  Eyes closed.  No obvious distress, answers questions and opens her eyes slightly.   HEENT: Lids and lashes are normal, no nasal deformity, pupils equal round and reactive to light, no nasal discharge or epistaxis.  Her hearing is normal.  She has no whitish plaques on the tongue anymore, no redness around them to suggest candidiasis.  The oropharynx is not very moist. Skin: She has some mild swelling in her right arm near her IV, otherwise her skin is warm and dry without suspicious rashes or lesions. Cardiac: Heart rate regular, no murmurs, no lower extremity edema. Respiratory: Respirations are even and unlabored, lungs are clear to auscultation Abdomen: Abdomen is soft without tenderness to palpation, ascites, distention, or HSM. MSK: No deformities or effusions. Neuro: Awake with psychomotor slowing noted.  Pupils equal round and reactive to light.  Face is symmetric.  She is able to lift both arms and legs off the bed and hold them there.  She is very rigid and attempting to move her limbs.  She spontaneously scratches her head without prompting.  Speech is slow but fluent. Psych: She is oriented to person place and time, answers questions slowly, affect catatonic, judgment and insight appear poor, she perseverates on dying and being helped to die.    Data Reviewed: I have personally reviewed following labs and imaging studies:  CBC: Recent Labs  Lab 08/08/17 1103  WBC 10.1  HGB 14.0  HCT 41.6  MCV 87.6  PLT 283   Basic Metabolic Panel: Recent Labs  Lab 08/06/17 0545 08/08/17 1103 08/09/17 0808  NA 135 139 138  K 3.8 4.2 3.8  CL 103 108 111  CO2 22 19* 18*  GLUCOSE 134* 102* 83  BUN 19 18 21*  CREATININE 0.76  1.69* 1.53*  CALCIUM 8.7* 9.0 8.5*   GFR: Estimated Creatinine Clearance: 32.8 mL/min (A) (by C-G formula based on SCr of 1.53 mg/dL (H)). Liver Function Tests: No results for input(s): AST, ALT, ALKPHOS, BILITOT, PROT, ALBUMIN in the last 168 hours. No results for input(s): LIPASE, AMYLASE in the last 168 hours. No results for input(s): AMMONIA in the last 168 hours. Coagulation Profile: No results for input(s): INR, PROTIME in the last 168 hours. Cardiac Enzymes: No results for input(s): CKTOTAL, CKMB, CKMBINDEX, TROPONINI in the last 168 hours. BNP (last 3 results) No results for input(s): PROBNP in the last 8760 hours. HbA1C: No results for input(s): HGBA1C in the last 72 hours. CBG: Recent Labs  Lab 08/08/17 0641 08/09/17 0623 08/09/17 0737 08/10/17 0607 08/10/17 1226  GLUCAP 102* 76 68 87 112*   Lipid Profile: No results for input(s): CHOL, HDL, LDLCALC, TRIG, CHOLHDL, LDLDIRECT in the last 72 hours. Thyroid Function Tests: No results for input(s): TSH, T4TOTAL, FREET4, T3FREE, THYROIDAB in the last 72 hours. Anemia Panel: No results for input(s): VITAMINB12, FOLATE, FERRITIN, TIBC, IRON, RETICCTPCT in the last 72 hours. Urine analysis:  Component Value Date/Time   COLORURINE YELLOW 08/03/2017 1746   APPEARANCEUR CLOUDY (A) 08/03/2017 1746   LABSPEC 1.023 08/03/2017 1746   PHURINE 5.0 08/03/2017 1746   GLUCOSEU NEGATIVE 08/03/2017 1746   HGBUR MODERATE (A) 08/03/2017 1746   BILIRUBINUR NEGATIVE 08/03/2017 1746   KETONESUR 20 (A) 08/03/2017 1746   PROTEINUR 30 (A) 08/03/2017 1746   NITRITE NEGATIVE 08/03/2017 1746   LEUKOCYTESUR LARGE (A) 08/03/2017 1746   Sepsis Labs: @LABRCNTIP (procalcitonin:4,lacticacidven:4)  ) Recent Results (from the past 240 hour(s))  Culture, Urine     Status: Abnormal   Collection Time: 08/03/17  4:42 PM  Result Value Ref Range Status   Specimen Description URINE, CATHETERIZED  Final   Special Requests   Final     NONE Performed at [facility], [address] [place], [place] [address]    Culture MULTIPLE SPECIES PRESENT, SUGGEST RECOLLECTION (A)  Final   Report Status 08/04/2017 FINAL  Final  Culture, Urine     Status: Abnormal   Collection Time: 08/04/17  4:47 PM  Result Value Ref Range Status   Specimen Description URINE, CATHETERIZED  Final   Special Requests   Final    NONE Performed at [facility], [address] [place], [place] [address]    Culture 50,000 COLONIES/mL ENTEROCOCCUS FAECALIS (A)  Final   Report Status 08/06/2017 FINAL  Final   Organism ID, Bacteria ENTEROCOCCUS FAECALIS (A)  Final      Susceptibility   Enterococcus faecalis - MIC*    AMPICILLIN <=2 SENSITIVE Sensitive     LEVOFLOXACIN 0.5 SENSITIVE Sensitive     NITROFURANTOIN <=16 SENSITIVE Sensitive     VANCOMYCIN 2 SENSITIVE Sensitive     * 50,000 COLONIES/mL ENTEROCOCCUS FAECALIS  Culture, blood (single)     Status: None (Preliminary result)   Collection Time: 08/08/17 11:00 AM  Result Value Ref Range Status   Specimen Description BLOOD RIGHT ANTECUBITAL  Final   Special Requests   Final    BOTTLES DRAWN AEROBIC AND ANAEROBIC Blood Culture adequate volume   Culture   Final    NO GROWTH 2 DAYS Performed at [facility], [address] [place], [place] [address]    Report Status PENDING  Incomplete         Radiology Studies: No results found.      Scheduled Meds: . bisacodyl  10 mg Rectal Once  . feeding supplement (ENSURE ENLIVE)  237 mL Oral BID BM  . haloperidol  10 mg Oral QHS  . haloperidol  5 mg Oral [id number]  . hydrOXYzine  25 mg Oral Q8H  . LORazepam  1 mg Oral TID   Continuous Infusions: . sodium chloride 125 mL/hr at 08/09/17 1032     LOS: 16 days    Time spent: 25 minutes   Alberteen Samhristopher P Torian Thoennes, MD [facility] 08/10/2017, 11:45 AM    Pager [phone] --- please page though AMION:  [email or web address] Password [email or web address] If 7PM-7AM, please contact  night-coverage

## 2017-08-10 NOTE — Progress Notes (Signed)
Pt refused lab draw at this time. States she had labs drawn yesterday and has the right to refuse. She was advised that her doctor wants her to have them drawn, but she continued to refuse.

## 2017-08-11 DIAGNOSIS — I10 Essential (primary) hypertension: Secondary | ICD-10-CM | POA: Diagnosis not present

## 2017-08-11 DIAGNOSIS — R945 Abnormal results of liver function studies: Secondary | ICD-10-CM | POA: Diagnosis not present

## 2017-08-11 DIAGNOSIS — R531 Weakness: Secondary | ICD-10-CM | POA: Diagnosis not present

## 2017-08-11 DIAGNOSIS — F061 Catatonic disorder due to known physiological condition: Secondary | ICD-10-CM | POA: Diagnosis not present

## 2017-08-11 LAB — GLUCOSE, CAPILLARY
GLUCOSE-CAPILLARY: 88 mg/dL (ref 65–99)
Glucose-Capillary: 118 mg/dL — ABNORMAL HIGH (ref 65–99)

## 2017-08-11 MED ORDER — AMLODIPINE BESYLATE 5 MG PO TABS
5.0000 mg | ORAL_TABLET | Freq: Every day | ORAL | Status: DC
  Administered 2017-08-11 – 2017-08-13 (×3): 5 mg via ORAL
  Filled 2017-08-11 (×3): qty 1

## 2017-08-11 MED ORDER — AMLODIPINE BESYLATE 5 MG PO TABS: 5.0000 mg | ORAL_TABLET | Freq: Every day | ORAL | 0 refills | Status: AC

## 2017-08-11 NOTE — Progress Notes (Signed)
CSW received call from [facility] to confirm pt still needs to be on list- CSW confirmed [facility] still at [facility] and in need of [facility]  Burna SisJenna H. Sheriden Archibeque, LCSW Clinical Social Worker [phone]

## 2017-08-11 NOTE — Discharge Summary (Signed)
Physician Discharge Summary  Sherri [name] ZOX:[id number] DOB: [birth year] DOA: 07/25/2017  PCP: [facility]  Admit date: 07/25/2017 Discharge date: 08/11/2017  Admitted From: Home  Disposition:  To [facility] Med-Surg for ultimate transfer to Inpatient Psychiatry     Home Health: N/A  Equipment/Devices: None  Discharge Condition: Fair  CODE STATUS: FULL Diet recommendation: Regular  Brief/Interim Summary: Sherri [name] is a 62 y.o. F with hx bipolar 1 disorder, schizophrenia with recent psychiatric admission in [facility] 2/5-3/20 for worsening psychosis and paranoia, HTN, and pre-diabetes who presented to the ED from ALF with generalized weakness, frequent falls and reported right sided weakness with decreased speaking.          Discharge Diagnoses:  Principal Problem:   Catatonia The patient initially appeared depressed and withdrawn.  Psychiatry evaluated the patient, feeling depressed mood was due to adjustment disorder and recommended increasing home haldol 5mg  > 10mg  qHS in addition to monthly depot haldol and home medications (last given 3/13, next was due 4/12, has been deferred to Inpatient Psychiatry). She has not been taking her home medications, and has a history of this at previous psychiatric hospitalization.   While in the hospital, she was progressively more withdrawn and listless, appeared to be catatonic and was expressing suicidality.   -Psychiatry was reconsulted on 4/5 and at that time recommended Ativan for catatonia, 1:1 bedside sitter and inpatient psychiatric hospitalization given her high risk of harm to self.  -Also recommended IVC if pt attempted to leave AMA or refused psychiatric hospitalization. -Since then, minimal improvement with Ativan 1 mg TID.  Patient refusing to eat.      Right sided weakness Her lack of engagement made initial history taking very limited, and there was an initial concern for stroke.  CT head at admission  on 3/29 was unremarkable, and her subsequent exam by Neurology was not indicative of a hemiparesis, but rather a generalized waxy stiffness.   -The patient refused MRI brain, but repeat CT head on 3/31 indicated no evolving stroke, and stroke was considered ruled out.   -New aspirin and statin that had been started empirically for suspected stroke were stopped.    UTI (urinary tract infection) Patient had incidental leukocyturia noted.  She had no urinary symptoms.   -Urine culture grew multiple species. -Repeat urine culture grew 50K enterococcus.   -She was given fosfomycin empirically.    Abnormal liver function Noted to have mildly elevated LFTs at admission, 60s to 90s.  Hep serologies negative. RUQ [place] negative.  These resolved spontaneously.    AKI Baseline Cr 0.8.  On 4/11 she was noted to be hypotensive in the morning.  BP medicines were held, ARB was held, and she was given fluids.   -Cr began to improve.   -Patient had stopped eating prior to that about 4 days, and it was presumed that she had pre-renal azotemia from hypovolemia.   -Creatinine improved from 1.7 to 1.5, but patient subsequently refusing lab draws.    Benign essential HTN Losartan 100 mg stopped due to AKI.  Metoprolol 25 mg BID continued. Amlodipine started.       Discharge Instructions  Discharge Instructions    Diet - low sodium heart healthy   Complete by:  As directed    Increase activity slowly   Complete by:  As directed      Allergies as of 08/11/2017   No Known Allergies     Medication List    STOP taking these medications  losartan 100 MG tablet Commonly known as:  COZAAR     TAKE these medications   acetaminophen 500 MG tablet Commonly known as:  TYLENOL Take 2 tablets (1,000 mg total) by mouth every 8 (eight) hours as needed for mild pain.   amLODipine 5 MG tablet Commonly known as:  NORVASC Take 1 tablet (5 mg total) by mouth daily. Start taking on:  08/12/2017    calcium-vitamin D 500-200 MG-UNIT tablet Commonly known as:  OSCAL WITH D Take 1 tablet by mouth daily with breakfast. For bone health   feeding supplement (ENSURE ENLIVE) Liqd Take 237 mLs by mouth 2 (two) times daily between meals.   haloperidol 10 MG tablet Commonly known as:  HALDOL Take 1 tablet (10 mg total) by mouth at bedtime. For mood control What changed:  Another medication with the same name was changed. Make sure you understand how and when to take each.   haloperidol 5 MG tablet Commonly known as:  HALDOL Take 1 tablet (5 mg total) by mouth every morning. For mood control What changed:    when to take this  additional instructions   haloperidol decanoate 100 MG/ML injection Commonly known as:  HALDOL DECANOATE Inject 1 mL (100 mg total) into the muscle every 30 (thirty) days. (Due on 08-08-17): For mood control   hydrOXYzine 25 MG tablet Commonly known as:  ATARAX/VISTARIL Take 1 tablet (25 mg total) by mouth every 8 (eight) hours. For anxiety   LORazepam 1 MG tablet Commonly known as:  ATIVAN Take 1 tablet (1 mg total) by mouth 3 (three) times daily.   metFORMIN 500 MG tablet Commonly known as:  GLUCOPHAGE Take 1 tablet (500 mg total) by mouth 2 (two) times daily with a meal. For diabetes management   metoprolol tartrate 25 MG tablet Commonly known as:  LOPRESSOR Take 1 tablet (25 mg total) by mouth 2 (two) times daily.   traZODone 50 MG tablet Commonly known as:  DESYREL Take 1 tablet (50 mg total) by mouth at bedtime as needed for sleep.       No Known Allergies  Consultations:  Neurology  Psychiatry   Procedures/Studies: Ct Head Wo Contrast  Result Date: 07/27/2017 CLINICAL DATA:  Stroke follow-up EXAM: CT HEAD WITHOUT CONTRAST TECHNIQUE: Contiguous axial images were obtained from the base of the skull through the vertex without intravenous contrast. COMPARISON:  Two days ago.  Right-sided weakness FINDINGS: Brain: No evidence of  infarction, hemorrhage, hydrocephalus, extra-axial collection or mass lesion/mass effect. Vascular: No hyperdense vessel or unexpected calcification. Skull: Normal. Negative for fracture or focal lesion. Sinuses/Orbits: No acute finding. IMPRESSION: Stable, negative head CT. Electronically Signed   By: Marnee SpringJonathon  Watts M.D.   On: 07/27/2017 12:07   Ct Head Code Stroke Wo Contrast  Result Date: 07/25/2017 CLINICAL DATA:  Code stroke.  Found on floor, right-sided weakness EXAM: CT HEAD WITHOUT CONTRAST TECHNIQUE: Contiguous axial images were obtained from the base of the skull through the vertex without intravenous contrast. COMPARISON:  None. FINDINGS: Brain: No evidence of acute infarction, hemorrhage, hydrocephalus, extra-axial collection or mass lesion/mass effect. Vascular: Negative for hyperdense vessel Skull: Negative Sinuses/Orbits: Negative Other: None ASPECTS (Alberta Stroke Program Early CT Score) - Ganglionic level infarction (caudate, lentiform nuclei, internal capsule, insula, M1-M3 cortex): 7 - Supraganglionic infarction (M4-M6 cortex): 3 Total score (0-10 with 10 being normal): 10 IMPRESSION: 1. No acute intracranial abnormality 2. ASPECTS is 10 3. These results were called by telephone at the time of interpretation on 07/25/2017  at 11:24 am to Dr. Laurence Slate , who verbally acknowledged these results. Electronically Signed   By: Marlan Palau M.D.   On: 07/25/2017 11:25   US Abdomen Limited Ruq  Result Date: 07/25/2017 CLINICAL DATA:  Increased LFT EXAM: ULTRASOUND ABDOMEN LIMITED RIGHT UPPER QUADRANT COMPARISON:  None. FINDINGS: Gallbladder: No gallstones or wall thickening visualized. No sonographic Murphy sign noted by sonographer. Common bile duct: Diameter: 4.9 mm Liver: Increased echogenicity. No focal hepatic abnormality. Portal vein is patent on color Doppler imaging with normal direction of blood flow towards the liver. Incidental note made of cysts in the mid right kidney measuring up to  2.2 cm. IMPRESSION: 1. Negative for gallstones or biliary dilatation 2. Increased hepatic echogenicity consistent with steatosis and or hepatocellular disease. Electronically Signed   By: Jasmine Pang M.D.   On: 07/25/2017 23:40      Subjective: No complaints.  No headache, confusion, weakness, malaise.  Still asking about how to die.  Discharge Exam: Vitals:   08/11/17 0745 08/11/17 1200  BP: (!) 159/76 (!) 161/71  Pulse: 94 (!) 108  Resp: 17 18  Temp: 98 F (36.7 C) 98.7 F (37.1 C)  SpO2: 97% 96%   Vitals:   08/11/17 0015 08/11/17 0400 08/11/17 0745 08/11/17 1200  BP: (!) 169/82 (!) 150/84 (!) 159/76 (!) 161/71  Pulse: 75 98 94 (!) 108  Resp: 20 18 17 18   Temp: 98.6 F (37 C) 98.5 F (36.9 C) 98 F (36.7 C) 98.7 F (37.1 C)  TempSrc: Oral Oral Oral Oral  SpO2: 99% 98% 97% 96%  Weight:      Height:        General: Patient awake, responsive, severe psychomotor slowing. Cardiovascular: Regular rate and rhythm, normal S1-S2, no murmurs rubs or gallops.  No lower extremity edema. Respiratory: Respirations even and unlabored, no rales or wheezes. Abdominal: Abdomen soft and nontender, no abdominal distention. Extremities: No edema, cyanosis, distal pulses equal and normal. Neuro: Patient has severe psychomotor slowing, stiff and bilateral arms, contractures noted, does not follow motor commands, but opens mouth follow simple commands. Psych: Appears depressed, catatonic, psychotic.  Oriented to person, place, and time.   The results of significant diagnostics from this hospitalization (including imaging, microbiology, ancillary and laboratory) are listed below for reference.       Labs:  Basic Metabolic Panel: Recent Labs  Lab 08/06/17 0545 08/08/17 1103 08/09/17 0808  NA 135 139 138  K 3.8 4.2 3.8  CL 103 108 111  CO2 22 19* 18*  GLUCOSE 134* 102* 83  BUN 19 18 21*  CREATININE 0.76 1.69* 1.53*  CALCIUM 8.7* 9.0 8.5*   Liver Function Tests: No results  for input(s): AST, ALT, ALKPHOS, BILITOT, PROT, ALBUMIN in the last 168 hours. No results for input(s): LIPASE, AMYLASE in the last 168 hours. No results for input(s): AMMONIA in the last 168 hours. CBC: Recent Labs  Lab 08/08/17 1103  WBC 10.1  HGB 14.0  HCT 41.6  MCV 87.6  PLT 283    CBG: Recent Labs  Lab 08/10/17 0607 08/10/17 1226 08/10/17 1624 08/11/17 0629 08/11/17 1557  GLUCAP 87 112* 100* 88 118*     Urinalysis    Component Value Date/Time   COLORURINE YELLOW 08/03/2017 1746   APPEARANCEUR CLOUDY (A) 08/03/2017 1746   LABSPEC 1.023 08/03/2017 1746   PHURINE 5.0 08/03/2017 1746   GLUCOSEU NEGATIVE 08/03/2017 1746   HGBUR MODERATE (A) 08/03/2017 1746   BILIRUBINUR NEGATIVE 08/03/2017 1746   KETONESUR  20 (A) 08/03/2017 1746   PROTEINUR 30 (A) 08/03/2017 1746   NITRITE NEGATIVE 08/03/2017 1746   LEUKOCYTESUR LARGE (A) 08/03/2017 1746    Microbiology Recent Results (from the past 240 hour(s))  Culture, Urine     Status: Abnormal   Collection Time: 08/03/17  4:42 PM  Result Value Ref Range Status   Specimen Description URINE, CATHETERIZED  Final   Special Requests   Final    NONE Performed at [facility], [address], [place], [place] [address]    Culture MULTIPLE SPECIES PRESENT, SUGGEST RECOLLECTION (A)  Final   Report Status 08/04/2017 FINAL  Final  Culture, Urine     Status: Abnormal   Collection Time: 08/04/17  4:47 PM  Result Value Ref Range Status   Specimen Description URINE, CATHETERIZED  Final   Special Requests   Final    NONE Performed at [facility], [address] [place], [place] [address]    Culture 50,000 COLONIES/mL ENTEROCOCCUS FAECALIS (A)  Final   Report Status 08/06/2017 FINAL  Final   Organism ID, Bacteria ENTEROCOCCUS FAECALIS (A)  Final      Susceptibility   Enterococcus faecalis - MIC*    AMPICILLIN <=2 SENSITIVE Sensitive     LEVOFLOXACIN 0.5 SENSITIVE Sensitive     NITROFURANTOIN <=16 SENSITIVE Sensitive      VANCOMYCIN 2 SENSITIVE Sensitive     * 50,000 COLONIES/mL ENTEROCOCCUS FAECALIS  Culture, blood (single)     Status: None (Preliminary result)   Collection Time: 08/08/17 11:00 AM  Result Value Ref Range Status   Specimen Description BLOOD RIGHT ANTECUBITAL  Final   Special Requests   Final    BOTTLES DRAWN AEROBIC AND ANAEROBIC Blood Culture adequate volume   Culture   Final    NO GROWTH 3 DAYS Performed at [facility], [address], [place], [place] [address]    Report Status PENDING  Incomplete     Time coordinating discharge: 40 minutes  SIGNED:   Alberteen Sam, MD  [facility] 08/11/2017, 5:01 PM

## 2017-08-11 NOTE — Progress Notes (Addendum)
Received phone call this am from [place] [facility]. She is asking for additional clinical information. Information faxed to the [facility] as per requested. CM also called Mr Ova Fresh[place]Steven Ar[place] office about the additional financial forms for the [facility]. They filled them out to the best of his records and faxed them to this CM who faxed them to the [facility]. Awaiting call regarding transfer for the patient. Spoke to patients brother, Al, over the phone and updated him on the progress for her being transferred. CM following.  Received phone call right before 4 pm that [facility] going [facility] Ms Deatra Cantermerson a bed. CM to receive call back about bed assignment at the [facility]. MD updated.

## 2017-08-12 DIAGNOSIS — I10 Essential (primary) hypertension: Secondary | ICD-10-CM | POA: Diagnosis not present

## 2017-08-12 DIAGNOSIS — R945 Abnormal results of liver function studies: Secondary | ICD-10-CM | POA: Diagnosis not present

## 2017-08-12 DIAGNOSIS — F061 Catatonic disorder due to known physiological condition: Secondary | ICD-10-CM | POA: Diagnosis not present

## 2017-08-12 LAB — GLUCOSE, CAPILLARY: Glucose-Capillary: 93 mg/dL (ref 65–99)

## 2017-08-12 MED ORDER — ENSURE ENLIVE PO LIQD
237.0000 mL | Freq: Three times a day (TID) | ORAL | Status: DC
  Administered 2017-08-13: 237 mL via ORAL

## 2017-08-12 NOTE — Progress Notes (Signed)
Sitting with patient relieving the sitter.  Pt stated "why don't you just let me die?  I'm in constant pain and want to die."  When asked if she had plans to harm herself, she stated "no".  She refuses to eat lunch, despite encouragement, but will ask for ensure and water.

## 2017-08-12 NOTE — Progress Notes (Signed)
Physical Therapy Treatment Patient Details Name: Sherri [name] MRN: [id number] DOB: 1956/04/03 Today's Date: 08/12/2017    History of Present Illness Sedalia Greeson  is a 62 y.o. female, w Bipolar, Schizophrenis, hypertension, glucose intolerance, apparently c/o right sided weakness and inability to walk. CT negative    PT Comments    Patient agreeable to participate in therapy.This session focused on sitting balance and ROM/strengthenging. Pt required mod/max A for sitting balance EOB and present with limited ROM of bilat LE and UE. Pt did assist in minimally with bed mobility and participated in [place]. RN asked to order [facility] boots for pt due to developing plantar flexion contractures.  Continue to progress as tolerated.   Follow Up Recommendations  SNF     Equipment Recommendations  None recommended by PT    Recommendations for Other Services OT consult;Speech consult     Precautions / Restrictions Precautions Precautions: Fall Precaution Comments: contracture developmet    Mobility  Bed Mobility Overal bed mobility: Needs Assistance Bed Mobility: Supine to Sit;Sit to Supine     Supine to sit: Max assist;+2 for physical assistance;HOB elevated Sit to supine: +2 for physical assistance;Total assist   General bed mobility comments: hand over hand assist for reaching with L UE to bed rail to aid in elevating trunk into sitting and assistance to bring bilat LE to EOB and scoot hips with use of bed pad  Transfers                    Ambulation/Gait                 Stairs             Wheelchair Mobility    Modified Rankin (Stroke Patients Only)       Balance Overall balance assessment: Needs assistance Sitting-balance support: No upper extremity supported;Bilateral upper extremity supported Sitting balance-Leahy Scale: Poor Sitting balance - Comments: pt required mod/max A for sitting balance; this session focused on sitting balance EOB  and ROM; pt with PF contractures and with PROM of bilat knees/hips increased ROM noted however pt not able to complete LE ROM without assistance; pt with limited ROM at bilat scapulae with PROM and AAROM Postural control: Posterior lean                                  Cognition Arousal/Alertness: Awake/alert Behavior During Therapy: Flat affect(smiling at times during therapy) Overall Cognitive Status: No family/caregiver present to determine baseline cognitive functioning Area of Impairment: Awareness;Attention;Safety/judgement;Problem solving                   Current Attention Level: Sustained   Following Commands: Follows one step commands with increased time Safety/Judgement: Decreased awareness of safety;Decreased awareness of deficits Awareness: Emergent Problem Solving: Slow processing;Decreased initiation;Difficulty sequencing;Requires verbal cues General Comments: Pt with cognitive impairments at baseine. pt spoke minimally during session however did respond appropriately to questions; not sure of how accurate report is of PLOF       Exercises      General Comments General comments (skin integrity, edema, etc.): pt drooling during session      Pertinent Vitals/Pain Pain Assessment: Faces Faces Pain Scale: Hurts little more Pain Location: feet Pain Descriptors / Indicators: Sore Pain Intervention(s): Limited activity within patient's tolerance;Repositioned    Home Living  Prior Function            PT Goals (current goals can now be found in the care plan section) Acute Rehab PT Goals Patient Stated Goal: none stated PT Goal Formulation: With patient Time For Goal Achievement: 08/09/17 Potential to Achieve Goals: Poor Progress towards PT goals: Not progressing toward goals - comment    Frequency    Min 2X/week      PT Plan Current plan remains appropriate    Co-evaluation              AM-PAC  PT "6 Clicks" Daily Activity  Outcome Measure  Difficulty turning over in bed (including adjusting bedclothes, sheets and blankets)?: Unable Difficulty moving from lying on back to sitting on the side of the bed? : Unable Difficulty sitting down on and standing up from a chair with arms (e.g., wheelchair, bedside commode, etc,.)?: Unable Help needed moving to and from a bed to chair (including a wheelchair)?: Total Help needed walking in hospital room?: Total Help needed climbing 3-5 steps with a railing? : Total 6 Click Score: 6    End of Session   Activity Tolerance: Patient limited by fatigue;Other (comment)(lack of participation?) Patient left: in bed;with call bell/phone within reach;with nursing/sitter in room Nurse Communication: Mobility status;Other (comment)(needs PRAFOs) PT Visit Diagnosis: Unsteadiness on feet (R26.81);Repeated falls (R29.6);Muscle weakness (generalized) (M62.81);Difficulty in walking, not elsewhere classified (R26.2);Hemiplegia and hemiparesis Hemiplegia - Right/Left: Right Hemiplegia - dominant/non-dominant: Dominant Hemiplegia - caused by: Unspecified     Time: [id number] PT Time Calculation (min) (ACUTE ONLY): 17 min  Charges:  $Therapeutic Activity: 8-22 mins                    G Codes:       Erline LevineKellyn Azjah Pardo, PTA Pager: [phone]     Carolynne EdouardKellyn R Latravion Graves 08/12/2017, 4:48 PM

## 2017-08-12 NOTE — Plan of Care (Signed)
Has met few goals this visit.

## 2017-08-12 NOTE — Progress Notes (Signed)
PROGRESS NOTE    Sherri [name]  ZHY:[id number] DOB: 1956-03-02 DOA: 07/25/2017 PCP: [facility]      Brief Narrative:  Sherri [name] is a 62 y.o. female with a history of bipolar 1 disorder, schizophrenia with recent [facility] admission 2/5-3/20 for worsening psychosis and paranoia, HTN, pre-diabetes who presented to the ED from ALF with generalized weakness/frequent falls and right sided weakness with decreased speaking, though her minimal engagement makes history very limited. Initial evaluation included a negative CT of the head though code stroke was called and she was transferred to [facility] where weakness had begun to resolve, noted to have been moving all extremities without focal deficit. Also noted was pyuria on urinalysis, and the patient was admitted for weakness and presumed UTI on ceftriaxone. The patient refused MRI, was started on aspirin 325mg , lipitor 40mg  per neurology, and   Psychiatry evaluated the patient, feeling depressed mood was due to adjustment disorder and recommended increase of home haldol 5mg  > 10mg  qHS in addition to monthly depot haldol and home medications. She has not been taking medications, and has a history of this at previous psychiatric hospitalization. Due to worsening catatonia and suicidal ideation, psychiatry was reconsulted on 4/5 recommending ativan, 1:1 bedside sitter and inpatient psychiatric hospitalization given her high risk of harm to self. Also recommended IVC if pt attempted to leave AMA or refused psychiatric hospitalization.     Assessment & Plan:  Catatonia, suicidal ideation, history of schizophrenia, bipolar 1 disorder:  -Discontinue Atarax, trazodone, as she has no agitation -Continue Haldol twice daily -Would be due for additional Haldol decanoate, but I defer the dual use of this to Psychiatry when she reaches the [place] -Continue lorazepam   Right-sided weakness:  Stroke ruled out with repeat CT head.  Aspirin and  statin were discontinued  Leukocyturia Clinical significance dubious.  Fosfomycin given  Hypotension Dehydration From poor p.o. intake, refusing food now.  May be trying to starve self.  -Continue maintenance fluids  AKI Developed AKI during hospitalization.  Improved with fluids, then patient refusing labs.    -Hold metformin until able to repeat BMP -Obtain BMP when able  T2DM Well controlled, hasn't needed any insulin in over a week -Continue sliding scale  Abnormal LFTs: Hepatitis panel negative, HIV negative. Resolved.  Hypertension: BP elevated again -Restart metoprolol.     DVT prophylaxis: SCDs Code Status: FULL Family Communication: None present MDM and disposition Plan: The below labs and imaging reports were reviewed, summarized above.  The patient had been admitted with catatonia, initially suspected to have a stroke which was ruled out.  She has now been recommended to go inpatient psychiatric treatment and is awaiting placement.  She is stable for discharge, over the weekend, discharge was to have taken place, but a bed was not actually available even though she was briefly discharged.  This happened again Monday.  [facility].  Thepatient has been accepted to the medical service under Dr. Darcel Smalling.  Their plan is to treat her on a medical floor until a psychiatry bed becomes available.        Consultants:   Neurology  Psychiatry     Subjective: Patient stable. No fever, cough, respiratory distress, vomiting.  She moves occasionally per sitter.  Nursing have no concerns.     Objective: Vitals:   08/12/17 0415 08/12/17 0900 08/12/17 1200 08/12/17 1616  BP: (!) 113/53 (!) 149/61 137/62 Marland Kitchen)  168/88  Pulse: (!) 59 78 68 (!) 108  Resp: 20 16 18 18   Temp: 98.1 F (36.7 C) 98.1 F (36.7 C) 98.3 F (36.8 C) 98.5 F (36.9 C)  TempSrc: Oral Oral Oral Oral  SpO2: 98% 96% 97% 98%    Weight:      Height:        Intake/Output Summary (Last 24 hours) at 08/12/2017 1755 Last data filed at 08/12/2017 1530 Gross per 24 hour  Intake 180 ml  Output 2000 ml  Net -1820 ml   Filed Weights   07/25/17 2026 07/31/17 0424 08/01/17 0430  Weight: 60.3 kg (133 lb) 61.5 kg (135 lb 9.3 oz) 61 kg (134 lb 7.7 oz)    Examination: General appearance: Adult female, lying in bed, catatonic.   HEENT: OP moist, no oral lesions other than crusts whitish, without surrounding redness to suggest candida. Skin: No suspicious rashes or lesisons Cardiac: RRR, no lower exremities edema Respiratory: Respirations even, no dyspnea, normal lung sounds Abdomen: No TTP, no HSM MSK: No deformities or effusions. Neuro: Awake, responsive, oriented.  Severe psychomotor slowing.  PERRL. Rigid, waxy movements.  5/5 strength in upper and lower extremities.  Psych: oriented to person, place and time.  Answers questions slowly, affect catatonic.       Data Reviewed: I have personally reviewed following labs and imaging studies:  CBC: Recent Labs  Lab 08/08/17 1103  WBC 10.1  HGB 14.0  HCT 41.6  MCV 87.6  PLT 283   Basic Metabolic Panel: Recent Labs  Lab 08/06/17 0545 08/08/17 1103 08/09/17 0808  NA 135 139 138  K 3.8 4.2 3.8  CL 103 108 111  CO2 22 19* 18*  GLUCOSE 134* 102* 83  BUN 19 18 21*  CREATININE 0.76 1.69* 1.53*  CALCIUM 8.7* 9.0 8.5*   GFR: Estimated Creatinine Clearance: 32.8 mL/min (A) (by C-G formula based on SCr of 1.53 mg/dL (H)). Liver Function Tests: No results for input(s): AST, ALT, ALKPHOS, BILITOT, PROT, ALBUMIN in the last 168 hours. No results for input(s): LIPASE, AMYLASE in the last 168 hours. No results for input(s): AMMONIA in the last 168 hours. Coagulation Profile: No results for input(s): INR, PROTIME in the last 168 hours. Cardiac Enzymes: No results for input(s): CKTOTAL, CKMB, CKMBINDEX, TROPONINI in the last 168 hours. BNP (last 3 results) No  results for input(s): PROBNP in the last 8760 hours. HbA1C: No results for input(s): HGBA1C in the last 72 hours. CBG: Recent Labs  Lab 08/10/17 1226 08/10/17 1624 08/11/17 0629 08/11/17 1557 08/12/17 0617  GLUCAP 112* 100* 88 118* 93   Lipid Profile: No results for input(s): CHOL, HDL, LDLCALC, TRIG, CHOLHDL, LDLDIRECT in the last 72 hours. Thyroid Function Tests: No results for input(s): TSH, T4TOTAL, FREET4, T3FREE, THYROIDAB in the last 72 hours. Anemia Panel: No results for input(s): VITAMINB12, FOLATE, FERRITIN, TIBC, IRON, RETICCTPCT in the last 72 hours. Urine analysis:    Component Value Date/Time   COLORURINE YELLOW 08/03/2017 1746   APPEARANCEUR CLOUDY (A) 08/03/2017 1746   LABSPEC 1.023 08/03/2017 1746   PHURINE 5.0 08/03/2017 1746   GLUCOSEU NEGATIVE 08/03/2017 1746   HGBUR MODERATE (A) 08/03/2017 1746   BILIRUBINUR NEGATIVE 08/03/2017 1746   KETONESUR 20 (A) 08/03/2017 1746   PROTEINUR 30 (A) 08/03/2017 1746   NITRITE NEGATIVE 08/03/2017 1746   LEUKOCYTESUR LARGE (A) 08/03/2017 1746   Sepsis Labs: @LABRCNTIP (procalcitonin:4,lacticacidven:4)  ) Recent Results (from the past 240 hour(s))  Culture, Urine     Status:  Abnormal   Collection Time: 08/03/17  4:42 PM  Result Value Ref Range Status   Specimen Description URINE, CATHETERIZED  Final   Special Requests   Final    NONE Performed at [facility], [address], [place], [place] [address]    Culture MULTIPLE SPECIES PRESENT, SUGGEST RECOLLECTION (A)  Final   Report Status 08/04/2017 FINAL  Final  Culture, Urine     Status: Abnormal   Collection Time: 08/04/17  4:47 PM  Result Value Ref Range Status   Specimen Description URINE, CATHETERIZED  Final   Special Requests   Final    NONE Performed at [facility], [address], [place], [place] [address]    Culture 50,000 COLONIES/mL ENTEROCOCCUS FAECALIS (A)  Final   Report Status 08/06/2017 FINAL  Final   Organism ID, Bacteria  ENTEROCOCCUS FAECALIS (A)  Final      Susceptibility   Enterococcus faecalis - MIC*    AMPICILLIN <=2 SENSITIVE Sensitive     LEVOFLOXACIN 0.5 SENSITIVE Sensitive     NITROFURANTOIN <=16 SENSITIVE Sensitive     VANCOMYCIN 2 SENSITIVE Sensitive     * 50,000 COLONIES/mL ENTEROCOCCUS FAECALIS  Culture, blood (single)     Status: None (Preliminary result)   Collection Time: 08/08/17 11:00 AM  Result Value Ref Range Status   Specimen Description BLOOD RIGHT ANTECUBITAL  Final   Special Requests   Final    BOTTLES DRAWN AEROBIC AND ANAEROBIC Blood Culture adequate volume   Culture   Final    NO GROWTH 4 DAYS Performed at [facility], [address], [place], [place] [address]    Report Status PENDING  Incomplete         Radiology Studies: No results found.      Scheduled Meds: . amLODipine  5 mg Oral Daily  . bisacodyl  10 mg Rectal Once  . feeding supplement (ENSURE ENLIVE)  237 mL Oral TID BM  . haloperidol  10 mg Oral QHS  . haloperidol  5 mg Oral BH-q7a  . hydrOXYzine  25 mg Oral Q8H  . LORazepam  1 mg Oral TID   Continuous Infusions: . sodium chloride 125 mL/hr at 08/12/17 0606     LOS: 18 days    Time spent: 20 minutes   Alberteen Sam, MD [facility] 08/12/2017, 11:00 AM    Pager [phone] --- please page though AMION:  [email or web address] Password [email or web address] If 7PM-7AM, please contact night-coverage

## 2017-08-12 NOTE — Progress Notes (Addendum)
No word from [facility] on bed available and MD receiving patient yesterday. Sherri [name] had felt it would be a fairly quick turn around.   10:10 am-- CM [name] to see about getting Ms Sherri [name] transferred today. No response. Message left. CM following.  12:50 pm-- Unknown Jimeached Sherri [name] at [facility]. She states they are on diversion and currently do not have an available bed. They will notify [facility] when bed available. Sherri ConesLaurie requests that clinicals be sent tomorrow as patient will need to be re-reviewed.   CM updated Sherri [name], pts brother, on transfer. He is requesting that patient not receive any more IM Haldol. He was able to speak to Ms Sherri [name] over the phone.

## 2017-08-12 NOTE — Progress Notes (Signed)
Nutrition Follow-up  DOCUMENTATION CODES:   Not applicable  INTERVENTION:  Increase Ensure Enlive to po TID, each supplement provides 350 kcal and 20 grams of protein   Consider Nutrition Support if patient remains admitted given very little PO intake x1 week outside of drinking ensure here and there  Recommend obtain new weight  NUTRITION DIAGNOSIS:   Inadequate oral intake related to poor appetite, social / environmental circumstances as evidenced by meal completion < 50%. -ongoing  GOAL:   Patient will meet greater than or equal to 90% of their needs -unmet  MONITOR:   PO intake, Weight trends, Supplement acceptance  ASSESSMENT:   Sherri [name] is a 62 y.o. female with a history of bipolar 1 disorder, schizophrenia with [facility] admission [facility]3/20 for worsening psychosis and paranoia, HTN, pre-diabetes who presented to the ED from ALF with generalized weakness/frequent falls and right sided weakness with decreased speaking,  Discussed with RN. Patient drank 1/2 of ensure today but has been refusing meals. States she wants to die. [facility]. If she ends up staying here consider nutrition support in setting of psych illness.    Labs reviewed Medications reviewed and include:  NS at 110925mL/hr  Diet Order:  Fall precautions DIET SOFT Room service appropriate? Yes; Fluid consistency: Thin Diet - low sodium heart healthy  EDUCATION NEEDS:   Not appropriate for education at this time  Skin:  Skin Assessment: Reviewed RN Assessment  Last BM:  08/08/2017  Height:   Ht Readings from Last 1 Encounters:  07/25/17 5\' 2"  (1.575 m)    Weight:   Wt Readings from Last 1 Encounters:  08/01/17 134 lb 7.7 oz (61 kg)    Ideal Body Weight:  50 kg  BMI:  Body mass index is 24.6 kg/m.  Estimated Nutritional Needs:   Kcal:  1353-1464 calories (MSJ x1.2-1.3)  Protein:  73-91 grams (1.2-1.5g/kg)  Fluid:   >1.5L  Dionne AnoWilliam M. Jill Ruppe, MS, RD LDN Inpatient Clinical Dietitian Pager [phone]

## 2017-08-13 LAB — CULTURE, BLOOD (SINGLE)
Culture: NO GROWTH
SPECIAL REQUESTS: ADEQUATE

## 2017-08-13 LAB — GLUCOSE, CAPILLARY: Glucose-Capillary: 77 mg/dL (ref 65–99)

## 2017-08-13 NOTE — Progress Notes (Signed)
Pt. Continues to refuse to eat, only takes a few sips of ensure and a few bites of applesauce with meds. Dr. Catha GosselinMikhail aware.

## 2017-08-13 NOTE — Progress Notes (Signed)
Occupational Therapy Treatment Patient Details Name: Sherri [name] MRN: [id number] DOB: 01/24/1956 Today's Date: 08/13/2017    History of present illness Sherri [name]  is a 62 y.o. female, w Bipolar, Schizophrenis, hypertension, glucose intolerance, apparently c/o right sided weakness and inability to walk. CT negative   OT comments  Pt required encouragement for sitting EOB activity. Pt participated in grooming, UB bathing (simulated) and UE ROM activities. Pt sat EOB x 9 minutes for activity and continues to require +2 assist with bed mobility to sit EOB and to return to supine. OT will continue to follow acutely  Follow Up Recommendations  SNF;Supervision/Assistance - 24 hour    Equipment Recommendations  None recommended by OT    Recommendations for Other Services      Precautions / Restrictions Precautions Precautions: Fall Precaution Comments: contracture developmet Restrictions Weight Bearing Restrictions: No       Mobility Bed Mobility Overal bed mobility: Needs Assistance Bed Mobility: Supine to Sit;Sit to Supine     Supine to sit: Max assist;+2 for physical assistance;HOB elevated Sit to supine: +2 for physical assistance;Total assist   General bed mobility comments: hand over hand assist for reaching with L UE to bed rail to aid in elevating trunk into sitting and assistance to bring bilat LE to EOB and scoot hips with use of bed pad  Transfers                 General transfer comment: pt refused    Balance Overall balance assessment: Needs assistance Sitting-balance support: No upper extremity supported;Bilateral upper extremity supported Sitting balance-Leahy Scale: Poor Sitting balance - Comments: pt required max A for sitting balance                                   ADL either performed or assessed with clinical judgement   ADL Overall ADL's : Needs assistance/impaired     Grooming: Wash/dry face;Wash/dry  hands;Moderate assistance;Brushing hair Grooming Details (indicate cue type and reason): Poor sitting balance and required max support sitting EOB with hand over hand assist to initiate Upper Body Bathing: Maximal assistance;Sitting Upper Body Bathing Details (indicate cue type and reason): siumulated, max support to maintian sitting balance at EOB                           General ADL Comments: pt required encouragement to sit EOB for activity. Pt sat EOB x 9 minutes     Vision       Perception     Praxis      Cognition Arousal/Alertness: Awake/alert Behavior During Therapy: Flat affect Overall Cognitive Status: No family/caregiver present to determine baseline cognitive functioning Area of Impairment: Awareness;Attention;Safety/judgement;Problem solving                 Orientation Level: Disoriented to;Time       Safety/Judgement: Decreased awareness of safety;Decreased awareness of deficits   Problem Solving: Slow processing;Decreased initiation;Difficulty sequencing;Requires verbal cues General Comments: Pt with cognitive impairments at baseine. pt spoke minimally during session however did respond appropriately to questions; not sure of how accurate report is of PLOF         Exercises Other Exercises Other Exercises: B shoulder flexion AAROM sitting EOB Other Exercises: assisted pt to hold head upright with gentle strecth and hold x 3 x 20 seconds each   Shoulder Instructions  General Comments      Pertinent Vitals/ Pain          Home Living                                          Prior Functioning/Environment              Frequency  Min 2X/week        Progress Toward Goals  OT Goals(current goals can now be found in the care plan section)  Progress towards OT goals: Progressing toward goals(slowly)     Plan Discharge plan remains appropriate    Co-evaluation                 AM-PAC PT "6  Clicks" Daily Activity     Outcome Measure   Help from another person eating meals?: A Little Help from another person taking care of personal grooming?: A Lot Help from another person toileting, which includes using toliet, bedpan, or urinal?: A Lot Help from another person bathing (including washing, rinsing, drying)?: A Lot Help from another person to put on and taking off regular upper body clothing?: A Lot Help from another person to put on and taking off regular lower body clothing?: A Lot 6 Click Score: 13    End of Session    OT Visit Diagnosis: Unsteadiness on feet (R26.81);Other abnormalities of gait and mobility (R26.89);Muscle weakness (generalized) (M62.81);Other symptoms and signs involving cognitive function   Activity Tolerance Patient limited by fatigue   Patient Left in bed;with call bell/phone within reach;with bed alarm set;with nursing/sitter in room   Nurse Communication      Functional Assessment Tool Used: AM-PAC 6 Clicks Daily Activity   Time: 8295-62131119-1144 OT Time Calculation (min): 25 min  Charges: OT G-codes **NOT FOR INPATIENT CLASS** Functional Assessment Tool Used: AM-PAC 6 Clicks Daily Activity OT General Charges $OT Visit: 1 Visit OT Treatments $Self Care/Home Management : 8-22 mins $Therapeutic Activity: 8-22 mins     Galen ManilaSpencer, Tarquin Welcher Jeanette 08/13/2017, 2:22 PM

## 2017-08-13 NOTE — Progress Notes (Signed)
Medical Necessity form filled out. Night RN Jami to call report.

## 2017-08-13 NOTE — Progress Notes (Signed)
Pt. Uncooperative during the shift. Not eating anything but applesauce with meds. Will only drink a few sips of Ensure. Refuses to wear the SCDs.   Risks of not wearing the SCDs explained to the patient and she is demanding staff massage her legs because they hurt. Explained to the pt. that this is not part of our protocol for DVT prevention.   Dr. Catha GosselinMikhail notified of pt.'s refusal to wear SCDs.

## 2017-08-13 NOTE — Progress Notes (Signed)
[facility] phone [facility] [phone] ext [phone]) [place] [facility] [place] [facility] and can [place] tonight. CM notified charge RN on [address] and provided the information for report: [phone]. Patient will be going to room 7B.  CM called Al, pt's brother, and updated him on the transfer, room number and phone number for the unit that CM was provided.

## 2017-08-13 NOTE — Progress Notes (Signed)
[facility] arrived to pick up patient; pt v/s were stable, and patient discharge information printed and handed to [facility].  Called [facility] to [facility] at [phone] ext. [phone].  I spoke with F. Balancio RN and report was completed at 7:52pm today.

## 2017-08-13 NOTE — Progress Notes (Addendum)
PROGRESS NOTE    Sherri [name]  NWG:[id number] DOB: Feb 11, 1956 DOA: 07/25/2017 PCP: [facility]   Brief Narrative:  HPI on 3/29/219 by Dr. Pearson Grippe Paullette Mckain  is a 62 y.o. female, w Bipolar, Schizophrenis, hypertension, glucose intolerance, apparently c/o generalized weakness.  Per ED notes ? C/o right sided weakness. Pt is a poor historian and unable to tell exactly when her symptoms started.  Pt denies headache, vision change, dysarthria, cp, palp, sob, n/v, diarrhea, brbpr.    Interim history Patient admitted for generalized weakness and frequent falls with right-sided weakness.  Code stroke was called, patient had a negative CT scan.  She was noted to have a presumed urinary tract infection was treated with ceftriaxone.  Patient refused MRI was started on aspirin as well as statin per neurology.  Psychiatry was consulted for depressed mood and adjustment disorder, and increased her Haldol dosage. Found to have increased catatonia and suicidal ideation. Psychiatry recommended inpatient psych admission given patient's high risk of harm to self.  Also recommend IVC if patient attempts to leave AMA. [facility].   Assessment & Plan   Catatonia/suicidal ideation/history of schizophrenia and bipolar 1 disorder -Psychiatry consulted and appreciated.  Atarax, trazodone discontinued.  Patient placed on Haldol twice daily as well as lorazepam -Currently has a Comptroller. -Pending inpatient psychiatric admission, [facility] admission?  Right-sided weakness -CT head unremarkable -Carotid Doppler showed 1-39% bilateral ICA stenosis.  Bilateral vertebral artery flow antegrade -Echocardiogram showed EF 65-70%, grade 1 diastolic dysfunction.  No cardiac source of emboli identified -Patient supposedly did refused MRI -Neurology was consulted and appreciated and placed patient on aspirin and statin however these were discontinued  Leukocyturia/?UTI -Unknown clinical significance,  my fosfomycin was given as well as ceftriaxone -UA reviewed from 4/7 and 3/29: unremarkable for infection -Urine culture from 08/04/2017 showed 50,000 colonies Enterococcus faecalis  Hypotension/dehydration -BP does appear to be controlled -With poor oral intake, question if patient is trying to starve herself as she is refusing food  Acute kidney injury -Metformin was held; suspect due to poor oral intake -Creatinine 1.53 (on 4/13) -Continue to monitor BMP   Diabetes mellitus, type II -As above, Metformin held -Has not needed insulin over the past week, continue as needed along with CBG monitoring  Abnormal LFTs -Resolved -HIV as well as hepatitis panel unremarkable -Right upper quadrant ultrasound negative for gallstones or biliary dilatation.  Increased hepatic echogenicity consistent with steatosis or hepatocellular disease  Hypertension -Continue amlodipine  Placement: Currently pending inpatient psychiatric placement versus placement at [facility].  It is medically stable for discharge and has been so for days. It seems that the patient had been accepted to the [place], medical service Dr. Darcel Smalling.  Nutrition -suspect due to psychiatric illness, continue Ensure DVT Prophylaxis  SCDs  Code Status: Full  Family Communication: None at bedside  Disposition Plan: admitted, pending inpatient psych bed vs [facility] bed  Consultants Psychiatry  Neurology   Procedures  Echocardiogram RUQ US Carotid ultrasound  Antibiotics   Anti-infectives (From admission, onward)   Start     Dose/Rate Route Frequency Ordered Stop   07/25/17 2145  cefTRIAXone (ROCEPHIN) 1 g in sodium chloride 0.9 % 100 mL IVPB  Status:  Discontinued     1 g 200 mL/hr over 30 Minutes Intravenous Every 24 hours 07/25/17 2139 07/28/17 1503      Subjective:   Sherri [name] seen and examined today.  Complains of pain and states she was given pain medications. States  she cannot walk. Does not want to eat. Denies  shortness of breath, nausea, vomiting, abdominal pain, dizziness or headache.  Objective:   Vitals:   08/12/17 2044 08/13/17 0023 08/13/17 0408 08/13/17 0726  BP: (!) 162/78 (!) 153/86 124/62 131/64  Pulse: (!) 108 93 78 85  Resp:    14  Temp: 98.3 F (36.8 C) 98.1 F (36.7 C) 98.3 F (36.8 C) 98.5 F (36.9 C)  TempSrc: Oral Oral Oral Oral  SpO2: 96% 97% 97% 97%  Weight:   60.3 kg (132 lb 15 oz)   Height:        Intake/Output Summary (Last 24 hours) at 08/13/2017 1118 Last data filed at 08/12/2017 1915 Gross per 24 hour  Intake 0 ml  Output 1600 ml  Net -1600 ml   Filed Weights   07/31/17 0424 08/01/17 0430 08/13/17 0408  Weight: 61.5 kg (135 lb 9.3 oz) 61 kg (134 lb 7.7 oz) 60.3 kg (132 lb 15 oz)    Exam  General: Well developed, chronically ill appearing, NAD  HEENT: NCAT, mucous membranes moist.   Neck: Suppl  Cardiovascular: S1 S2 auscultated, no rubs, murmurs or gallops. Regular rate and rhythm.  Respiratory: Clear to auscultation bilaterally with equal chest rise  Abdomen: Soft, nontender, nondistended, + bowel sounds  Extremities: warm dry without cyanosis clubbing or edema  Neuro: AAOx3, nonfocal, answers slowly  Skin: Without rashes exudates or nodules  Psych: Flat, catatonic   Data Reviewed: I have personally reviewed following labs and imaging studies  CBC: Recent Labs  Lab 08/08/17 1103  WBC 10.1  HGB 14.0  HCT 41.6  MCV 87.6  PLT 283   Basic Metabolic Panel: Recent Labs  Lab 08/08/17 1103 08/09/17 0808  NA 139 138  K 4.2 3.8  CL 108 111  CO2 19* 18*  GLUCOSE 102* 83  BUN 18 21*  CREATININE 1.69* 1.53*  CALCIUM 9.0 8.5*   GFR: Estimated Creatinine Clearance: 32.6 mL/min (A) (by C-G formula based on SCr of 1.53 mg/dL (H)). Liver Function Tests: No results for input(s): AST, ALT, ALKPHOS, BILITOT, PROT, ALBUMIN in the last 168 hours. No results for input(s): LIPASE, AMYLASE in the last 168 hours. No results for input(s):  AMMONIA in the last 168 hours. Coagulation Profile: No results for input(s): INR, PROTIME in the last 168 hours. Cardiac Enzymes: No results for input(s): CKTOTAL, CKMB, CKMBINDEX, TROPONINI in the last 168 hours. BNP (last 3 results) No results for input(s): PROBNP in the last 8760 hours. HbA1C: No results for input(s): HGBA1C in the last 72 hours. CBG: Recent Labs  Lab 08/10/17 1624 08/11/17 0629 08/11/17 1557 08/12/17 0617 08/13/17 0627  GLUCAP 100* 88 118* 93 77   Lipid Profile: No results for input(s): CHOL, HDL, LDLCALC, TRIG, CHOLHDL, LDLDIRECT in the last 72 hours. Thyroid Function Tests: No results for input(s): TSH, T4TOTAL, FREET4, T3FREE, THYROIDAB in the last 72 hours. Anemia Panel: No results for input(s): VITAMINB12, FOLATE, FERRITIN, TIBC, IRON, RETICCTPCT in the last 72 hours. Urine analysis:    Component Value Date/Time   COLORURINE YELLOW 08/03/2017 1746   APPEARANCEUR CLOUDY (A) 08/03/2017 1746   LABSPEC 1.023 08/03/2017 1746   PHURINE 5.0 08/03/2017 1746   GLUCOSEU NEGATIVE 08/03/2017 1746   HGBUR MODERATE (A) 08/03/2017 1746   BILIRUBINUR NEGATIVE 08/03/2017 1746   KETONESUR 20 (A) 08/03/2017 1746   PROTEINUR 30 (A) 08/03/2017 1746   NITRITE NEGATIVE 08/03/2017 1746   LEUKOCYTESUR LARGE (A) 08/03/2017 1746   Sepsis Labs: @LABRCNTIP (procalcitonin:4,lacticidven:4)  )  Recent Results (from the past 240 hour(s))  Culture, Urine     Status: Abnormal   Collection Time: 08/03/17  4:42 PM  Result Value Ref Range Status   Specimen Description URINE, CATHETERIZED  Final   Special Requests   Final    NONE Performed at [facility], [address], [place], [place] [address]    Culture MULTIPLE SPECIES PRESENT, SUGGEST RECOLLECTION (A)  Final   Report Status 08/04/2017 FINAL  Final  Culture, Urine     Status: Abnormal   Collection Time: 08/04/17  4:47 PM  Result Value Ref Range Status   Specimen Description URINE, CATHETERIZED  Final   Special  Requests   Final    NONE Performed at [facility], [address], [place], [place] [address]    Culture 50,000 COLONIES/mL ENTEROCOCCUS FAECALIS (A)  Final   Report Status 08/06/2017 FINAL  Final   Organism ID, Bacteria ENTEROCOCCUS FAECALIS (A)  Final      Susceptibility   Enterococcus faecalis - MIC*    AMPICILLIN <=2 SENSITIVE Sensitive     LEVOFLOXACIN 0.5 SENSITIVE Sensitive     NITROFURANTOIN <=16 SENSITIVE Sensitive     VANCOMYCIN 2 SENSITIVE Sensitive     * 50,000 COLONIES/mL ENTEROCOCCUS FAECALIS  Culture, blood (single)     Status: None (Preliminary result)   Collection Time: 08/08/17 11:00 AM  Result Value Ref Range Status   Specimen Description BLOOD RIGHT ANTECUBITAL  Final   Special Requests   Final    BOTTLES DRAWN AEROBIC AND ANAEROBIC Blood Culture adequate volume   Culture   Final    NO GROWTH 4 DAYS Performed at [facility], [address], [place], [place] [address]    Report Status PENDING  Incomplete      Radiology Studies: No results found.   Scheduled Meds: . amLODipine  5 mg Oral Daily  . bisacodyl  10 mg Rectal Once  . feeding supplement (ENSURE ENLIVE)  237 mL Oral TID BM  . haloperidol  10 mg Oral QHS  . haloperidol  5 mg Oral [id number]  . hydrOXYzine  25 mg Oral Q8H  . LORazepam  1 mg Oral TID   Continuous Infusions: . sodium chloride 125 mL/hr at 08/13/17 0620     LOS: 19 days   Time Spent in minutes   30 minutes  Sherri [name] D.O. on 08/13/2017 at 11:18 AM  Between 7am to 7pm - Pager - [phone]  After 7pm go to [email or web address] - password [email or web address]  And look for the night coverage person covering for me after hours  [facility] Office  [phone]

## 2017-08-13 NOTE — Progress Notes (Signed)
0830: [facility]. CM following.

## 2019-02-14 IMAGING — CT CT HEAD W/O CM
3 series · 16 of 47 positions shown, 19 images · non-contrast
Comparison: Two days ago.  Right-sided weakness

CLINICAL DATA: Stroke follow-up

EXAM:
CT HEAD WITHOUT CONTRAST
TECHNIQUE: Contiguous axial images were obtained from the base of the skull
through the vertex without intravenous contrast.

[Series 3: head 5.0 h30s · axial · 0.42mm/px · z∈[-239,-114]mm · 10 of 30 slices shown, 13 images]
[im 3/30  brain]
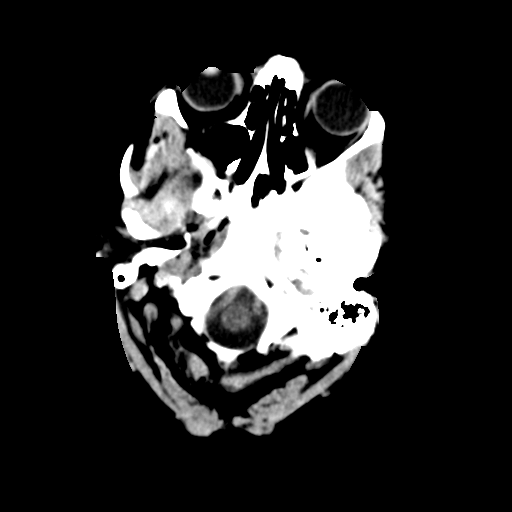
[im 3/30  bone]
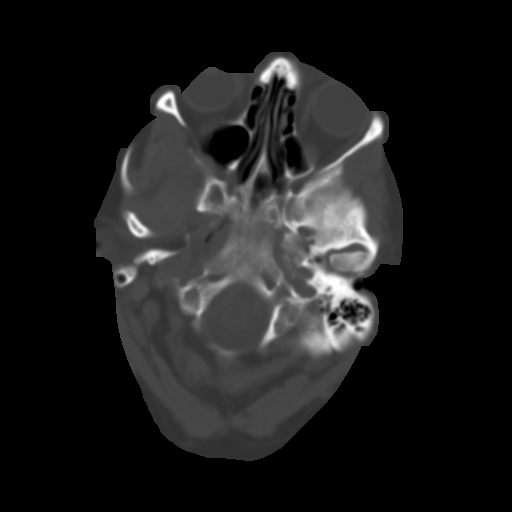
[im 6/30  brain]
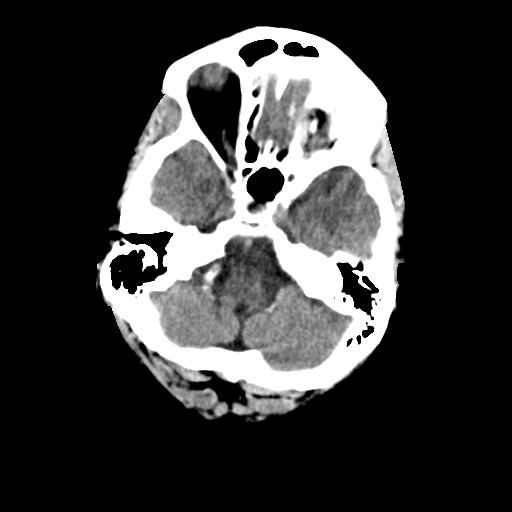
[im 9/30  brain]
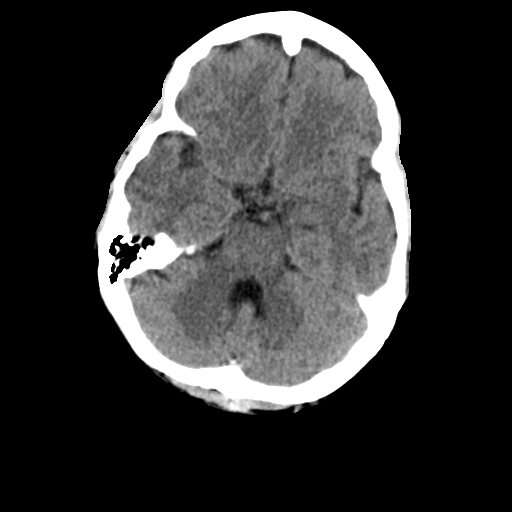
[im 11/30  brain]
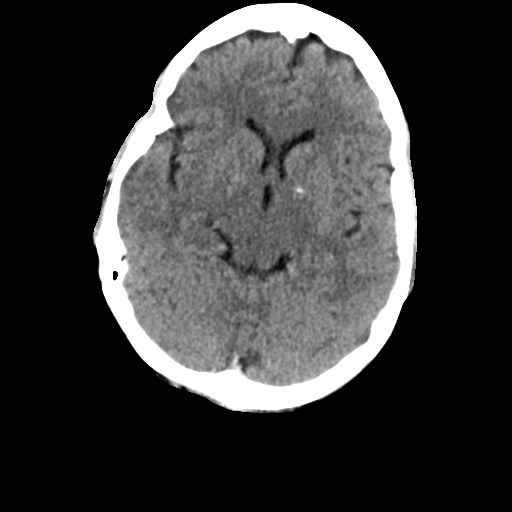
[im 14/30  brain]
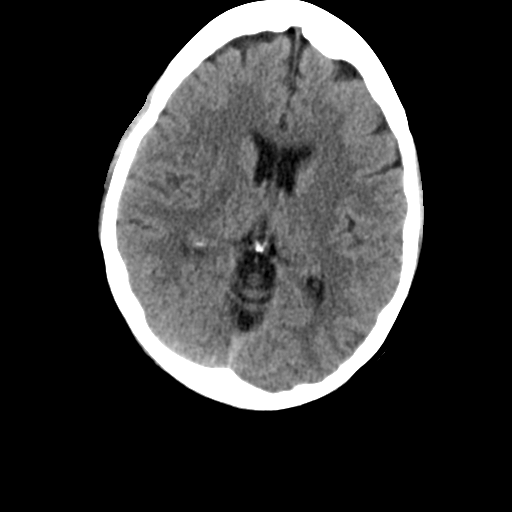
[im 14/30  bone]
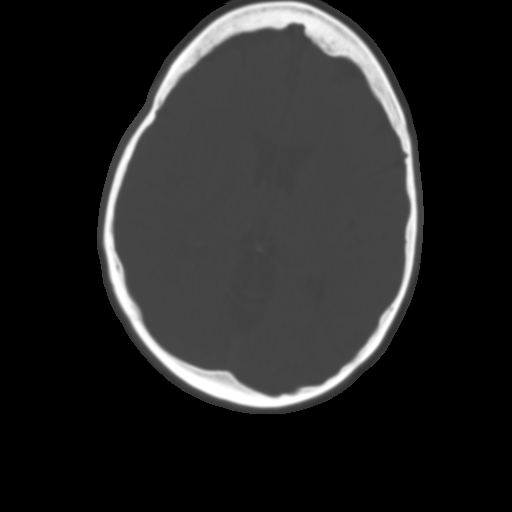
[im 17/30  brain]
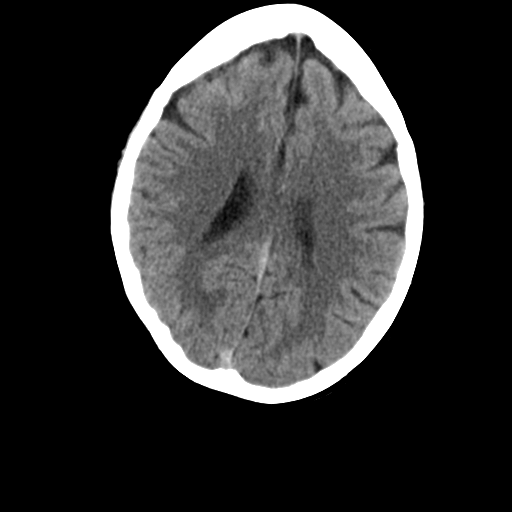
[im 20/30  brain]
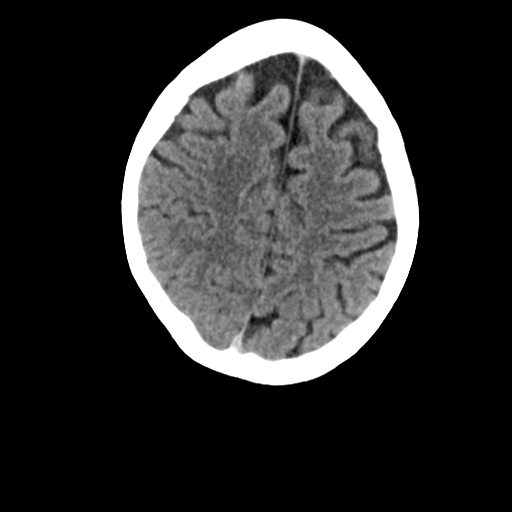
[im 23/30  brain]
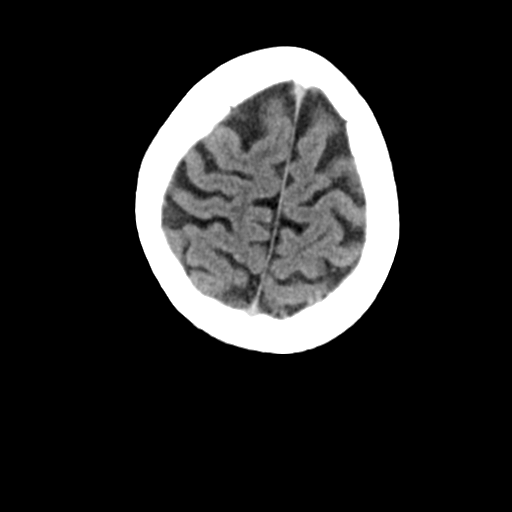
[im 25/30  brain]
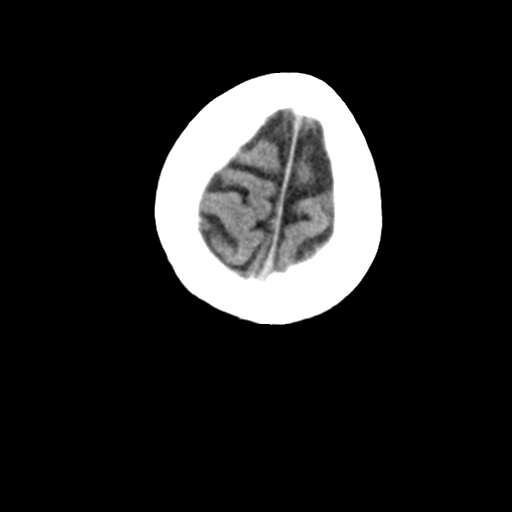
[im 25/30  bone]
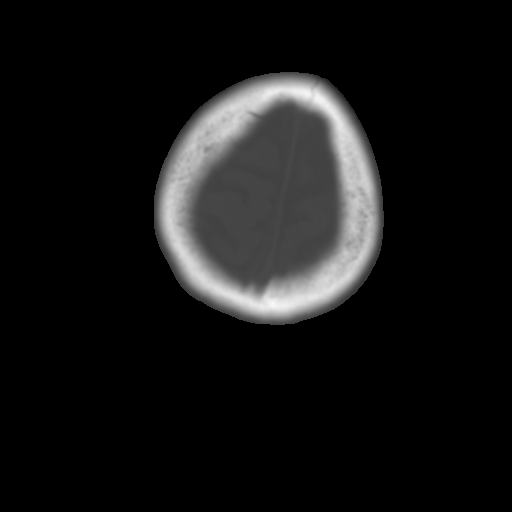
[im 28/30  brain]
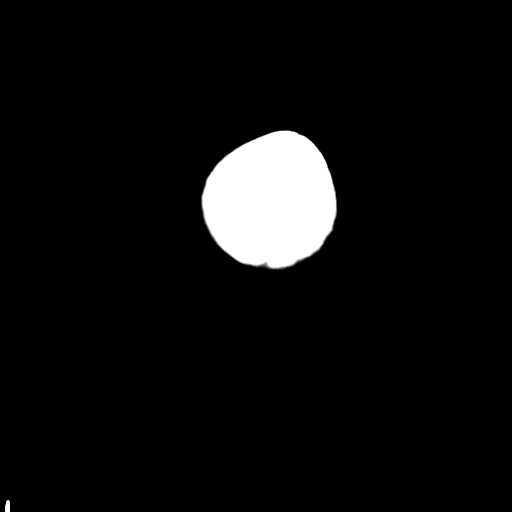

[Series 5: head 3.0 mpr cor · coronal · 0.28mm/px · 3 of 67 slices shown]
[im 23/67  brain]
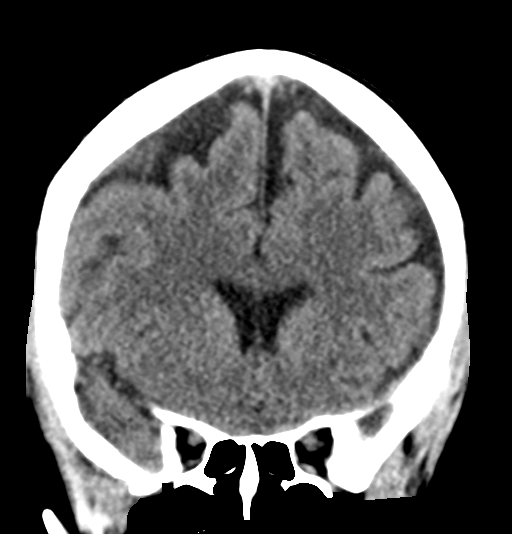
[im 30/67  brain]
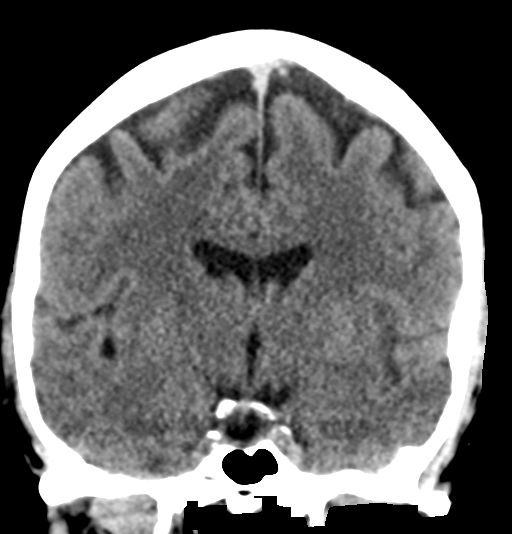
[im 37/67  brain]
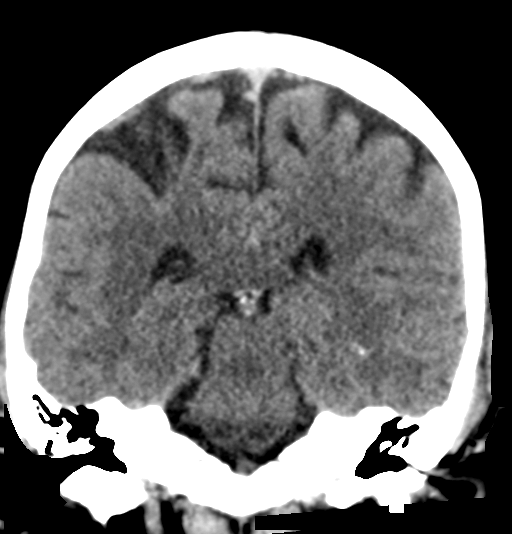

[Series 6: head 3.0 mpr sag · sagittal · 0.29mm/px · 3 of 58 slices shown]
[im 20/58  brain]
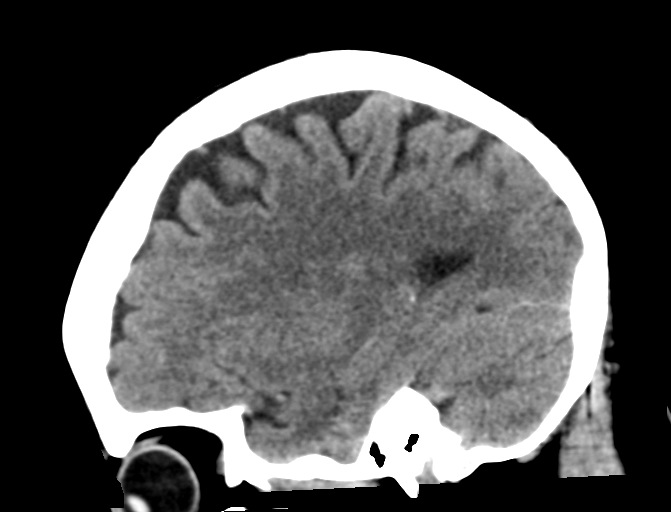
[im 29/58  brain]
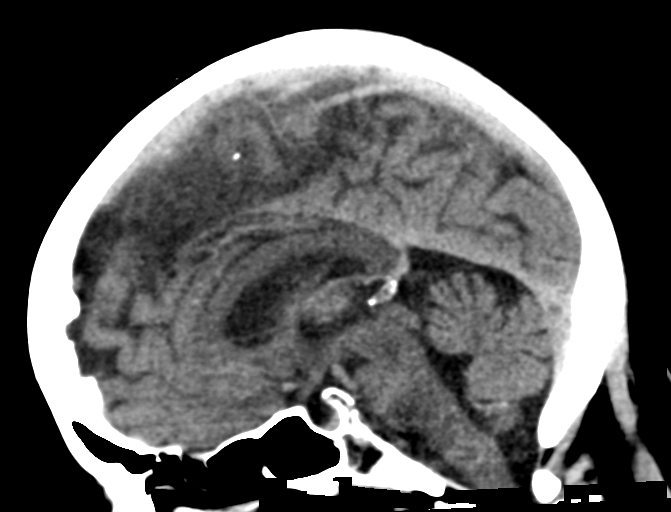
[im 39/58  brain]
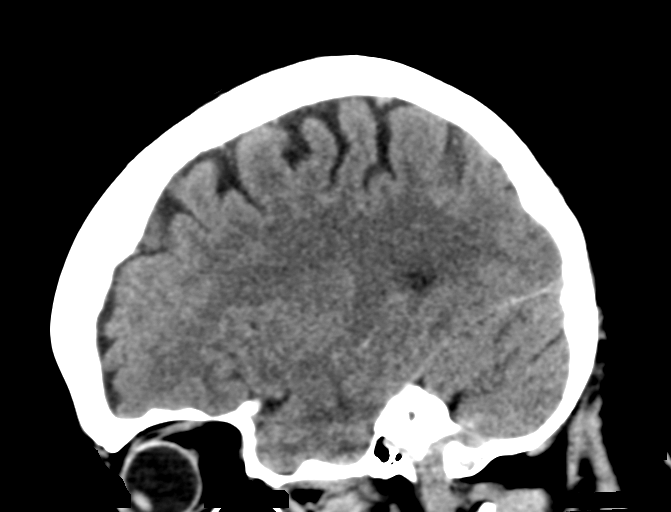

[16 of 47 positions shown; findings below may reference images not displayed]

FINDINGS: Brain: No evidence of infarction, hemorrhage, hydrocephalus,
extra-axial collection or mass lesion/mass effect.

Vascular: No hyperdense vessel or unexpected calcification.

Skull: Normal. Negative for fracture or focal lesion.

Sinuses/Orbits: No acute finding.
IMPRESSION: Stable, negative head CT.
